# Patient Record
Sex: Female | Born: 1985 | Hispanic: Yes | Marital: Married | Smoking: Never smoker
Health system: Southern US, Community
[De-identification: ages and names within clinical notes are randomized; demographics above are authoritative.]

## PROBLEM LIST (undated history)

## (undated) DIAGNOSIS — R51 Headache: Secondary | ICD-10-CM

## (undated) DIAGNOSIS — D649 Anemia, unspecified: Secondary | ICD-10-CM

## (undated) DIAGNOSIS — C801 Malignant (primary) neoplasm, unspecified: Secondary | ICD-10-CM

## (undated) DIAGNOSIS — S025XXA Fracture of tooth (traumatic), initial encounter for closed fracture: Secondary | ICD-10-CM

## (undated) DIAGNOSIS — R238 Other skin changes: Secondary | ICD-10-CM

## (undated) DIAGNOSIS — Z853 Personal history of malignant neoplasm of breast: Secondary | ICD-10-CM

## (undated) DIAGNOSIS — R519 Headache, unspecified: Secondary | ICD-10-CM

## (undated) DIAGNOSIS — Z9221 Personal history of antineoplastic chemotherapy: Secondary | ICD-10-CM

## (undated) DIAGNOSIS — C719 Malignant neoplasm of brain, unspecified: Secondary | ICD-10-CM

---

## 2007-08-12 ENCOUNTER — Inpatient Hospital Stay (HOSPITAL_COMMUNITY): Admission: AD | Admit: 2007-08-12 | Discharge: 2007-08-12 | Payer: Self-pay | Admitting: Family Medicine

## 2007-09-08 ENCOUNTER — Inpatient Hospital Stay (HOSPITAL_COMMUNITY): Admission: AD | Admit: 2007-09-08 | Discharge: 2007-09-12 | Payer: Self-pay | Admitting: Obstetrics

## 2007-09-09 ENCOUNTER — Encounter (INDEPENDENT_AMBULATORY_CARE_PROVIDER_SITE_OTHER): Payer: Self-pay | Admitting: Obstetrics

## 2007-09-19 ENCOUNTER — Inpatient Hospital Stay (HOSPITAL_COMMUNITY): Admission: AD | Admit: 2007-09-19 | Discharge: 2007-09-19 | Payer: Self-pay | Admitting: Gynecology

## 2011-03-30 NOTE — Op Note (Signed)
[name], [name] NO.:  [id number]   MEDICAL RECORD NO.:  [id number]          PATIENT TYPE:  INP   LOCATION:  9303                          FACILITY:  [facility]   PHYSICIAN:  Kathreen Cosier, M.D.DATE OF BIRTH:  [birth year]   DATE OF PROCEDURE:  09/09/2007  DATE OF DISCHARGE:                               OPERATIVE REPORT   PREOPERATIVE DIAGNOSES:  1. Failure to progress in labor.  2. Cephalopelvic disproportion.  3. Maternal fever.   ANESTHESIA:  Epidural.   PROCEDURE:  The patient was placed on the operating table in a supine  position.  Abdomen prepped and draped.  Bladder emptied with a Foley  catheter.  Transverse suprapubic incision made and carried down to the  rectus fascia.  Fascia cleaned and incised the length of the incision.  Recti muscles retracted laterally.  Peritoneum incised longitudinally.  Transverse incision made in the visceral peritoneum above the bladder.  Bladder mobilized inferiorly.  Transverse lower uterine incision made.  The patient delivered from the OP position a female Apgar 9 and 10,  weighing 7 pounds 6 ounces.  Team was in attendance.   The placenta anterior sent to pathology.  Uterine cavity cleaned with  dry laps.  The placenta was foul smelling.  The uterine incision closed  in one layer with continuous suture of #1 chromic.  Hemostasis  satisfactory.  Bladder flap reattached with 2-0 chromic.  Uterus well  contracted.  Tubes and ovaries normal.  Abdomen closed in layers,  peritoneum continuous suture of 0 chromic, fascia continuous suture of 0  Dexon, and the skin closed with subcuticular stitch of 4-0 Monocryl.  Blood loss 500 mL.           ______________________________  Kathreen Cosier, M.D.     BAM/MEDQ  D:  09/09/2007  T:  09/10/2007  Job:  [id number]

## 2011-03-30 NOTE — H&P (Signed)
NAMELYAH, MILLIRONS NO.:  [id number]   MEDICAL RECORD NO.:  [id number]          PATIENT TYPE:  INP   LOCATION:  9303                          FACILITY:  [facility]   PHYSICIAN:  Kathreen Cosier, M.D.DATE OF BIRTH:  1986-05-02   DATE OF ADMISSION:  09/08/2007  DATE OF DISCHARGE:                              HISTORY & PHYSICAL   The patient is a 25 year old gravida 1, EDC October 22, negative GBS,  admitted with ruptured membranes which occurred at 8:30 a.m.  The fluid  was clear.  She was contracting every 2-3 minutes. Cervix 3 cm, 80%,  vertex, zero station.  This was at 2:05 p.m. on October 24.  She became  fully dilated by midnight, and after an hour of pushing, the vertex did  not progress below a zero station, and there was a lot of molding.  She  also had a temperature of 101+ and was treated with ampicillin 2 grams  IV every 6 hours and O2.  It was decided she would be delivered by C-  section for failure to progress in labor, CPD. Estimated fetal weight 8  pounds.   PHYSICAL EXAMINATION:  GENERAL:  Reveals a well-developed female in  labor.  HEENT: Negative.  LUNGS:  Clear.  HEART:  Regular rate and rhythm.  No murmurs or gallops.  BREASTS:  No masses.  ABDOMEN:  Term size with estimated fetal weight between 7-8 pounds.  PELVIC:  As described above.  EXTREMITIES:  Negative.           ______________________________  Kathreen Cosier, M.D.     BAM/MEDQ  D:  09/09/2007  T:  09/10/2007  Job:  [id number]

## 2011-04-02 NOTE — Discharge Summary (Signed)
[name], [name] NO.:  [id number]   MEDICAL RECORD NO.:  [id number]          PATIENT TYPE:  INP   LOCATION:  9303                          [facility]ACILITY:  [facility]   PHYSICIAN:  Kathreen Cosier, M.D.DATE OF BIRTH:  [birth year]   DATE OF ADMISSION:  09/08/2007  DATE OF DISCHARGE:  09/12/2007                               DISCHARGE SUMMARY   The patient is a 25 year old primigravida, [facility] September 06, 2007, who was  admitted in labor and the patient subsequently underwent primary low  transverse cesarean section because of failure to progress in labor and  CPD.  She had a female, Apgar 9/10 from the OP position, weighing 7  pounds 6 ounces.  The team was in attendance.  Postoperatively she did  well.  On admission her hemoglobin was 10.9, postop 7.1.  She was  asymptomatic.  Platelets 199 and 160.  RPR negative.  HIV negative.  Urine negative.  Postoperatively she did well and was discharged on the  third postoperative day ambulatory on a regular diet, on Tylox for pain  and ferrous sulfate for her anemia.   DISCHARGE DIAGNOSIS:  Status post primary low transverse cesarean  section because of failure to progress in labor.           ______________________________  Kathreen Cosier, M.D.     BAM/MEDQ  D:  10/04/2007  T:  10/04/2007  Job:  [id number]

## 2011-08-25 LAB — CBC
Hemoglobin: 10.9 — ABNORMAL LOW
MCV: 67 — ABNORMAL LOW
Platelets: 199
RBC: 3.27 — ABNORMAL LOW
RDW: 15.3 — ABNORMAL HIGH
WBC: 16.1 — ABNORMAL HIGH

## 2011-08-25 LAB — RPR: RPR Ser Ql: NONREACTIVE

## 2011-08-26 LAB — URINALYSIS, ROUTINE W REFLEX MICROSCOPIC
Glucose, UA: 100 — AB
Specific Gravity, Urine: 1.01
pH: 5.5

## 2012-08-03 ENCOUNTER — Emergency Department (HOSPITAL_COMMUNITY): Payer: Self-pay

## 2012-08-03 ENCOUNTER — Encounter (HOSPITAL_COMMUNITY): Payer: Self-pay | Admitting: Emergency Medicine

## 2012-08-03 ENCOUNTER — Emergency Department (HOSPITAL_COMMUNITY)
Admission: EM | Admit: 2012-08-03 | Discharge: 2012-08-03 | Disposition: A | Discharge status: Home / Self Care | Payer: Self-pay | Attending: Emergency Medicine | Admitting: Emergency Medicine

## 2012-08-03 DIAGNOSIS — Z3490 Encounter for supervision of normal pregnancy, unspecified, unspecified trimester: Secondary | ICD-10-CM

## 2012-08-03 DIAGNOSIS — Z331 Pregnant state, incidental: Secondary | ICD-10-CM | POA: Insufficient documentation

## 2012-08-03 DIAGNOSIS — N72 Inflammatory disease of cervix uteri: Secondary | ICD-10-CM | POA: Insufficient documentation

## 2012-08-03 DIAGNOSIS — R109 Unspecified abdominal pain: Secondary | ICD-10-CM | POA: Insufficient documentation

## 2012-08-03 LAB — URINE MICROSCOPIC-ADD ON

## 2012-08-03 LAB — URINALYSIS, ROUTINE W REFLEX MICROSCOPIC
Glucose, UA: NEGATIVE mg/dL
Hgb urine dipstick: NEGATIVE
Ketones, ur: NEGATIVE mg/dL
Protein, ur: NEGATIVE mg/dL

## 2012-08-03 LAB — WET PREP, GENITAL: Clue Cells Wet Prep HPF POC: NONE SEEN

## 2012-08-03 LAB — ABO/RH

## 2012-08-03 LAB — POCT PREGNANCY, URINE: Preg Test, Ur: POSITIVE — AB

## 2012-08-03 MED ORDER — ACETAMINOPHEN 325 MG PO TABS
650.0000 mg | ORAL_TABLET | Freq: Once | ORAL | Status: AC
  Administered 2012-08-03: 650 mg via ORAL
  Filled 2012-08-03: qty 2

## 2012-08-03 MED ORDER — LIDOCAINE HCL (PF) 1 % IJ SOLN
INTRAMUSCULAR | Status: AC
  Administered 2012-08-03: 0.9 mL
  Filled 2012-08-03: qty 5

## 2012-08-03 MED ORDER — CEFTRIAXONE SODIUM 250 MG IJ SOLR
250.0000 mg | Freq: Once | INTRAMUSCULAR | Status: AC
  Administered 2012-08-03: 250 mg via INTRAMUSCULAR
  Filled 2012-08-03: qty 250

## 2012-08-03 MED ORDER — AZITHROMYCIN 250 MG PO TABS
1000.0000 mg | ORAL_TABLET | Freq: Once | ORAL | Status: AC
  Administered 2012-08-03: 1000 mg via ORAL
  Filled 2012-08-03: qty 4

## 2012-08-03 NOTE — ED Notes (Signed)
Pt states abdominal pain for a week. Pt states she went to the bathroom and her urine was dark colored. Pt had a positive pregnancy test. Vaginal discharge.

## 2012-08-03 NOTE — ED Notes (Signed)
Pt going to u/s 

## 2012-08-03 NOTE — ED Provider Notes (Signed)
History     CSN: [id number]  Arrival date & time 08/03/12  7829   First MD Initiated Contact with Patient 08/03/12 [phone]      Chief Complaint  Patient presents with  . Abdominal Pain    (Consider location/radiation/quality/duration/timing/severity/associated sxs/prior treatment) HPI  26 year old female who is a G1 P1 presents c/o lower back pain and Left lower abd pain x 1 week. Hx was obtained through husband who acts as interpreter.  Pain is described as achy and cramping with occasional sharp stabbing sensation, worse to left lower abdomen.  Pain is waxing and waning.  Has noticed vaginal spotting x 2 days, and vaginal discharge. Has nipples sensitivity. Has endorse nausea and occasional vomiting after eating, decreased appetite.  Has had 2 positive home pregnancy test.  LMP July 30th.  This is a plan pregnancy.  No complication in last pregnancy.  Has not tried anything to alleviate sxs.    Past Medical History  Diagnosis Date  . Hypertension     Past Surgical History  Procedure Date  . Cesarean section     No family history on file.  History  Substance Use Topics  . Smoking status: Never Smoker   . Smokeless tobacco: Not on file  . Alcohol Use: No    OB History    Grav Para Term Preterm Abortions TAB SAB Ect Mult Living   1 1 1       1       Review of Systems  All other systems reviewed and are negative.    Allergies  Review of patient's allergies indicates no known allergies.  Home Medications   Current Outpatient Rx  Name Route Sig Dispense Refill  . PRENATAL VITAMIN PO Oral Take 1 tablet by mouth daily.      BP 98/68  Pulse 79  Temp 97.3 F (36.3 C) (Oral)  Resp 20  SpO2 100%  LMP 06/13/2012  Physical Exam  Nursing note and vitals reviewed. Constitutional: She appears well-developed and well-nourished. No distress.  HENT:  Head: Normocephalic and atraumatic.  Eyes: Conjunctivae normal are normal.  Neck: Normal range of motion. Neck  supple.  Cardiovascular: Normal rate and regular rhythm.   Pulmonary/Chest: Effort normal and breath sounds normal. She exhibits no tenderness.  Abdominal: Soft. There is no tenderness.  Genitourinary: Uterus normal. There is breast tenderness. No breast swelling, discharge or bleeding. There is no rash or lesion on the right labia. There is no rash or lesion on the left labia. Cervix exhibits discharge. Cervix exhibits no motion tenderness. Right adnexum displays no mass, no tenderness and no fullness. Left adnexum displays tenderness. Left adnexum displays no mass and no fullness. No erythema, tenderness or bleeding around the vagina. Vaginal discharge found.         Chaperone present  Lymphadenopathy:       Right: No inguinal adenopathy present.       Left: No inguinal adenopathy present.    ED Course  Procedures (including critical care time)  Labs Reviewed  POCT PREGNANCY, URINE - Abnormal; Notable for the following:    Preg Test, Ur POSITIVE (*)     All other components within normal limits  URINALYSIS, ROUTINE W REFLEX MICROSCOPIC   Results for orders placed during the hospital encounter of 08/03/12  URINALYSIS, ROUTINE W REFLEX MICROSCOPIC      Component Value Range   Color, Urine YELLOW  YELLOW   APPearance CLEAR  CLEAR   Specific Gravity, Urine 1.017  1.005 -  1.030   pH 6.5  5.0 - 8.0   Glucose, UA NEGATIVE  NEGATIVE mg/dL   Hgb urine dipstick NEGATIVE  NEGATIVE   Bilirubin Urine NEGATIVE  NEGATIVE   Ketones, ur NEGATIVE  NEGATIVE mg/dL   Protein, ur NEGATIVE  NEGATIVE mg/dL   Urobilinogen, UA 1.0  0.0 - 1.0 mg/dL   Nitrite NEGATIVE  NEGATIVE   Leukocytes, UA SMALL (*) NEGATIVE  POCT PREGNANCY, URINE      Component Value Range   Preg Test, Ur POSITIVE (*) NEGATIVE  HCG, QUANTITATIVE, PREGNANCY      Component Value Range   hCG, Beta Chain, Quant, S 14782 (*) <5 mIU/mL  ABO/RH      Component Value Range   ABO/RH(D) O POS     No rh immune globuloin NOT A RH IMMUNE  GLOBULIN CANDIDATE, PT RH POSITIVE    WET PREP, GENITAL      Component Value Range   Yeast Wet Prep HPF POC FEW (*) NONE SEEN   Trich, Wet Prep NONE SEEN  NONE SEEN   Clue Cells Wet Prep HPF POC NONE SEEN  NONE SEEN   WBC, Wet Prep HPF POC TOO NUMEROUS TO COUNT (*) NONE SEEN  URINE MICROSCOPIC-ADD ON      Component Value Range   Squamous Epithelial / LPF RARE  RARE   WBC, UA 3-6  <3 WBC/hpf   RBC / HPF 0-2  <3 RBC/hpf   Bacteria, UA RARE  RARE   US Ob Comp Less 14 Wks  08/03/2012  *RADIOLOGY REPORT*  Clinical Data: Pain.  Positive pregnancy test.  OBSTETRIC <14 WK [place] AND TRANSVAGINAL OB US  Technique:  Both transabdominal and transvaginal ultrasound examinations were performed for complete evaluation of the gestation as well as the maternal uterus, adnexal regions, and pelvic cul-de-sac.  Transvaginal technique was performed to assess early pregnancy.  Comparison:  None.  Intrauterine gestational sac:  Single. Yolk sac: Yes Embryo: Yes Cardiac Activity: Yes Heart Rate: 162 bpm  CRL: 12.7  mm  7 w  3 d             [place] EDC: 03/19/2013  Maternal uterus/adnexae: There is no subchorionic hemorrhage.  The right ovary is normal. 1.6 cm corpus luteum cyst on the left ovary.  IMPRESSION: Normal appearing single intrauterine pregnancy of approximately 7 weeks 3 days gestation.   Original Report Authenticated By: Gwynn Burly, M.D.    US Ob Transvaginal  08/03/2012  *RADIOLOGY REPORT*  Clinical Data: Pain.  Positive pregnancy test.  OBSTETRIC <14 WK [place] AND TRANSVAGINAL OB US  Technique:  Both transabdominal and transvaginal ultrasound examinations were performed for complete evaluation of the gestation as well as the maternal uterus, adnexal regions, and pelvic cul-de-sac.  Transvaginal technique was performed to assess early pregnancy.  Comparison:  None.  Intrauterine gestational sac:  Single. Yolk sac: Yes Embryo: Yes Cardiac Activity: Yes Heart Rate: 162 bpm  CRL: 12.7  mm  7 w  3 d             [place] EDC:  03/19/2013  Maternal uterus/adnexae: There is no subchorionic hemorrhage.  The right ovary is normal. 1.6 cm corpus luteum cyst on the left ovary.  IMPRESSION: Normal appearing single intrauterine pregnancy of approximately 7 weeks 3 days gestation.   Original Report Authenticated By: Gwynn Burly, M.D.     1. Pregnancy 2. cervicitis  MDM  Positive pregnancy test today.  Has abd pain and vaginal spotting. Work  up initiated.  Discussed with my attending.    11:14 AM Pelvic exam remarkable for mild tenderness to L adnexa, without evidence of PID.  US shows a normal appearing single intrauterine pregnancy of approx. 7 wks 3 days.  Pt is O+, RH+.    12:33 PM Wet prep shows WBC TNTC and a few  yeast.  Since pt has discharge and L adnexal tenderness, will treat for cervicitis with rocephin/zithromax.  Recommend f/u with her OBGYN, will also give referral to [facility] for further care.        Fayrene Helper, PA-C 08/03/12 1235

## 2012-08-07 NOTE — ED Provider Notes (Signed)
Medical screening examination/treatment/procedure(s) were performed by non-physician practitioner and as supervising physician I was immediately available for consultation/collaboration.   Suzi Roots, MD 08/07/12 [phone]

## 2012-11-15 NOTE — L&D Delivery Note (Signed)
Delivery Note At 8:33 AM a viable female was delivered via  (Presentation: ;  ).  APGAR: , ; weight .   Placenta status: , .  Cord:  with the following complications: .  Cord pH: not done  Anesthesia: Epidural  Episiotomy: None Lacerations: Sulcus;1st degree Suture Repair: 2.0 vicryl Est. Blood Loss (mL): 250  Mom to postpartum.  Baby to nursery-stable.  [name] A 03/08/2013, 8:41 AM

## 2012-11-27 LAB — OB RESULTS CONSOLE RUBELLA ANTIBODY, IGM: Rubella: IMMUNE

## 2012-11-27 LAB — OB RESULTS CONSOLE ANTIBODY SCREEN: Antibody Screen: NEGATIVE

## 2012-11-27 LAB — OB RESULTS CONSOLE ABO/RH

## 2012-11-27 LAB — OB RESULTS CONSOLE RPR: RPR: NONREACTIVE

## 2013-03-08 ENCOUNTER — Encounter (HOSPITAL_COMMUNITY): Payer: Self-pay | Admitting: Anesthesiology

## 2013-03-08 ENCOUNTER — Inpatient Hospital Stay (HOSPITAL_COMMUNITY): Payer: Medicaid Other | Admitting: Anesthesiology

## 2013-03-08 ENCOUNTER — Inpatient Hospital Stay (HOSPITAL_COMMUNITY)
Admission: AD | Admit: 2013-03-08 | Discharge: 2013-03-10 | DRG: 775 | Disposition: A | Discharge status: Home / Self Care | Payer: Medicaid Other | Source: Ambulatory Visit | Attending: Obstetrics | Admitting: Obstetrics

## 2013-03-08 ENCOUNTER — Encounter (HOSPITAL_COMMUNITY): Payer: Self-pay

## 2013-03-08 DIAGNOSIS — O34219 Maternal care for unspecified type scar from previous cesarean delivery: Secondary | ICD-10-CM | POA: Diagnosis present

## 2013-03-08 LAB — CBC
HCT: 35.9 % — ABNORMAL LOW (ref 36.0–46.0)
Hemoglobin: 11.8 g/dL — ABNORMAL LOW (ref 12.0–15.0)
MCH: 21 pg — ABNORMAL LOW (ref 26.0–34.0)
MCHC: 32.9 g/dL (ref 30.0–36.0)
RBC: 5.62 MIL/uL — ABNORMAL HIGH (ref 3.87–5.11)

## 2013-03-08 LAB — ABO/RH: ABO/RH(D): O POS

## 2013-03-08 LAB — TYPE AND SCREEN

## 2013-03-08 LAB — RPR: RPR Ser Ql: NONREACTIVE

## 2013-03-08 MED ORDER — FLEET ENEMA 7-19 GM/118ML RE ENEM: 1.0000 | ENEMA | RECTAL | Status: DC | PRN

## 2013-03-08 MED ORDER — SIMETHICONE 80 MG PO CHEW: 80.0000 mg | CHEWABLE_TABLET | ORAL | Status: DC | PRN

## 2013-03-08 MED ORDER — TERBUTALINE SULFATE 1 MG/ML IJ SOLN: 0.2500 mg | Freq: Once | INTRAMUSCULAR | Status: DC | PRN

## 2013-03-08 MED ORDER — LIDOCAINE HCL (PF) 1 % IJ SOLN
30.0000 mL | INTRAMUSCULAR | Status: DC | PRN
  Filled 2013-03-08: qty 30

## 2013-03-08 MED ORDER — LACTATED RINGERS IV SOLN
500.0000 mL | Freq: Once | INTRAVENOUS | Status: AC
  Administered 2013-03-08: 500 mL via INTRAVENOUS

## 2013-03-08 MED ORDER — ONDANSETRON HCL 4 MG/2ML IJ SOLN: 4.0000 mg | Freq: Four times a day (QID) | INTRAMUSCULAR | Status: DC | PRN

## 2013-03-08 MED ORDER — PRENATAL MULTIVITAMIN CH
1.0000 | ORAL_TABLET | Freq: Every day | ORAL | Status: DC
  Administered 2013-03-08 – 2013-03-09 (×2): 1 via ORAL
  Filled 2013-03-08 (×2): qty 1

## 2013-03-08 MED ORDER — OXYCODONE-ACETAMINOPHEN 5-325 MG PO TABS
1.0000 | ORAL_TABLET | ORAL | Status: DC | PRN
  Administered 2013-03-09: 1 via ORAL
  Administered 2013-03-09: 2 via ORAL
  Administered 2013-03-10: 1 via ORAL
  Filled 2013-03-08 (×7): qty 1

## 2013-03-08 MED ORDER — OXYTOCIN BOLUS FROM INFUSION: 500.0000 mL | INTRAVENOUS | Status: DC

## 2013-03-08 MED ORDER — OXYCODONE-ACETAMINOPHEN 5-325 MG PO TABS
1.0000 | ORAL_TABLET | ORAL | Status: DC | PRN
  Administered 2013-03-08 – 2013-03-10 (×6): 1 via ORAL
  Filled 2013-03-08 (×3): qty 1

## 2013-03-08 MED ORDER — OXYTOCIN 40 UNITS IN LACTATED RINGERS INFUSION - SIMPLE MED
1.0000 m[IU]/min | INTRAVENOUS | Status: DC
  Administered 2013-03-08: 2 m[IU]/min via INTRAVENOUS

## 2013-03-08 MED ORDER — IBUPROFEN 600 MG PO TABS
600.0000 mg | ORAL_TABLET | Freq: Four times a day (QID) | ORAL | Status: DC | PRN
  Filled 2013-03-08 (×5): qty 1

## 2013-03-08 MED ORDER — LIDOCAINE HCL (PF) 1 % IJ SOLN
INTRAMUSCULAR | Status: DC | PRN
  Administered 2013-03-08 (×2): 5 mL

## 2013-03-08 MED ORDER — BUTORPHANOL TARTRATE 1 MG/ML IJ SOLN: 1.0000 mg | INTRAMUSCULAR | Status: DC | PRN

## 2013-03-08 MED ORDER — TETANUS-DIPHTH-ACELL PERTUSSIS 5-2.5-18.5 LF-MCG/0.5 IM SUSP
0.5000 mL | Freq: Once | INTRAMUSCULAR | Status: AC
  Administered 2013-03-09: 0.5 mL via INTRAMUSCULAR
  Filled 2013-03-08: qty 0.5

## 2013-03-08 MED ORDER — CITRIC ACID-SODIUM CITRATE 334-500 MG/5ML PO SOLN: 30.0000 mL | ORAL | Status: DC | PRN

## 2013-03-08 MED ORDER — ONDANSETRON HCL 4 MG PO TABS: 4.0000 mg | ORAL_TABLET | ORAL | Status: DC | PRN

## 2013-03-08 MED ORDER — FENTANYL 2.5 MCG/ML BUPIVACAINE 1/10 % EPIDURAL INFUSION (WH - ANES)
INTRAMUSCULAR | Status: DC | PRN
  Administered 2013-03-08: 14 mL/h via EPIDURAL

## 2013-03-08 MED ORDER — FENTANYL 2.5 MCG/ML BUPIVACAINE 1/10 % EPIDURAL INFUSION (WH - ANES)
14.0000 mL/h | INTRAMUSCULAR | Status: DC | PRN
  Filled 2013-03-08: qty 125

## 2013-03-08 MED ORDER — DIPHENHYDRAMINE HCL 25 MG PO CAPS: 25.0000 mg | ORAL_CAPSULE | Freq: Four times a day (QID) | ORAL | Status: DC | PRN

## 2013-03-08 MED ORDER — EPHEDRINE 5 MG/ML INJ
10.0000 mg | INTRAVENOUS | Status: DC | PRN
  Filled 2013-03-08: qty 2

## 2013-03-08 MED ORDER — DIBUCAINE 1 % RE OINT: 1.0000 "application " | TOPICAL_OINTMENT | RECTAL | Status: DC | PRN

## 2013-03-08 MED ORDER — DIPHENHYDRAMINE HCL 50 MG/ML IJ SOLN
12.5000 mg | INTRAMUSCULAR | Status: DC | PRN
Start: 2013-03-08 — End: 2013-03-10

## 2013-03-08 MED ORDER — ACETAMINOPHEN 325 MG PO TABS: 650.0000 mg | ORAL_TABLET | ORAL | Status: DC | PRN

## 2013-03-08 MED ORDER — PHENYLEPHRINE 40 MCG/ML (10ML) SYRINGE FOR IV PUSH (FOR BLOOD PRESSURE SUPPORT)
80.0000 ug | PREFILLED_SYRINGE | INTRAVENOUS | Status: DC | PRN
  Filled 2013-03-08: qty 5
  Filled 2013-03-08: qty 2

## 2013-03-08 MED ORDER — LACTATED RINGERS IV SOLN: 500.0000 mL | INTRAVENOUS | Status: DC | PRN

## 2013-03-08 MED ORDER — ZOLPIDEM TARTRATE 5 MG PO TABS: 5.0000 mg | ORAL_TABLET | Freq: Every evening | ORAL | Status: DC | PRN

## 2013-03-08 MED ORDER — IBUPROFEN 600 MG PO TABS
600.0000 mg | ORAL_TABLET | Freq: Four times a day (QID) | ORAL | Status: DC
  Administered 2013-03-08 – 2013-03-10 (×8): 600 mg via ORAL
  Filled 2013-03-08 (×4): qty 1

## 2013-03-08 MED ORDER — LACTATED RINGERS IV SOLN
INTRAVENOUS | Status: DC
  Administered 2013-03-08: 04:00:00 via INTRAVENOUS

## 2013-03-08 MED ORDER — OXYTOCIN 40 UNITS IN LACTATED RINGERS INFUSION - SIMPLE MED
62.5000 mL/h | INTRAVENOUS | Status: DC
  Filled 2013-03-08: qty 1000

## 2013-03-08 MED ORDER — PHENYLEPHRINE 40 MCG/ML (10ML) SYRINGE FOR IV PUSH (FOR BLOOD PRESSURE SUPPORT)
80.0000 ug | PREFILLED_SYRINGE | INTRAVENOUS | Status: DC | PRN
  Filled 2013-03-08: qty 2

## 2013-03-08 MED ORDER — LANOLIN HYDROUS EX OINT: TOPICAL_OINTMENT | CUTANEOUS | Status: DC | PRN

## 2013-03-08 MED ORDER — BENZOCAINE-MENTHOL 20-0.5 % EX AERO
1.0000 "application " | INHALATION_SPRAY | CUTANEOUS | Status: DC | PRN
  Administered 2013-03-08: 1 via TOPICAL
  Filled 2013-03-08: qty 56

## 2013-03-08 MED ORDER — WITCH HAZEL-GLYCERIN EX PADS: 1.0000 "application " | MEDICATED_PAD | CUTANEOUS | Status: DC | PRN

## 2013-03-08 MED ORDER — FERROUS SULFATE 325 (65 FE) MG PO TABS
325.0000 mg | ORAL_TABLET | Freq: Two times a day (BID) | ORAL | Status: DC
  Administered 2013-03-08 – 2013-03-10 (×4): 325 mg via ORAL
  Filled 2013-03-08 (×4): qty 1

## 2013-03-08 MED ORDER — EPHEDRINE 5 MG/ML INJ
10.0000 mg | INTRAVENOUS | Status: DC | PRN
  Filled 2013-03-08: qty 2
  Filled 2013-03-08: qty 4

## 2013-03-08 MED ORDER — SENNOSIDES-DOCUSATE SODIUM 8.6-50 MG PO TABS
2.0000 | ORAL_TABLET | Freq: Every day | ORAL | Status: DC
  Administered 2013-03-08 – 2013-03-09 (×2): 2 via ORAL

## 2013-03-08 MED ORDER — ONDANSETRON HCL 4 MG/2ML IJ SOLN: 4.0000 mg | INTRAMUSCULAR | Status: DC | PRN

## 2013-03-08 NOTE — Anesthesia Postprocedure Evaluation (Signed)
  Anesthesia Post-op Note  Patient: [name]  Procedure(s) Performed: * No procedures listed *  Patient Location: PACU  Anesthesia Type:Epidural  Level of Consciousness: awake, alert , oriented and patient cooperative  Airway and Oxygen Therapy: Patient Spontanous Breathing  Post-op Pain: none  Post-op Assessment: Post-op Vital signs reviewed, Patient's Cardiovascular Status Stable and Respiratory Function Stable  Post-op Vital Signs: Reviewed and stable  Complications: No apparent anesthesia complications

## 2013-03-08 NOTE — Anesthesia Preprocedure Evaluation (Signed)
Anesthesia Evaluation  Patient identified by MRN, date of birth, ID band Patient awake    Reviewed: Allergy & Precautions, H&P , NPO status , Patient's Chart, lab work & pertinent test results  History of Anesthesia Complications Negative for: history of anesthetic complications  Airway Mallampati: II TM Distance: >3 FB Neck ROM: full    Dental no notable dental hx. (+) Teeth Intact   Pulmonary neg pulmonary ROS,  breath sounds clear to auscultation  Pulmonary exam normal       Cardiovascular hypertension, negative cardio ROS  Rhythm:regular Rate:Normal     Neuro/Psych negative neurological ROS  negative psych ROS   GI/Hepatic negative GI ROS, Neg liver ROS,   Endo/Other  negative endocrine ROS  Renal/GU negative Renal ROS  negative genitourinary   Musculoskeletal   Abdominal Normal abdominal exam  (+)   Peds  Hematology negative hematology ROS (+)   Anesthesia Other Findings   Reproductive/Obstetrics (+) Pregnancy                           Anesthesia Physical Anesthesia Plan  ASA: II  Anesthesia Plan: Epidural   Post-op Pain Management:    Induction:   Airway Management Planned:   Additional Equipment:   Intra-op Plan:   Post-operative Plan:   Informed Consent: I have reviewed the patients History and Physical, chart, labs and discussed the procedure including the risks, benefits and alternatives for the proposed anesthesia with the patient or authorized representative who has indicated his/her understanding and acceptance.     Plan Discussed with:   Anesthesia Plan Comments:         Anesthesia Quick Evaluation  

## 2013-03-08 NOTE — H&P (Signed)
This is Dr. Francoise Ceo dictating the history and physical on  [name] she's a 27 year old gravida 2 para 1001 at 37 weeks and 4 days EDC 03/18/2013 negative GBS admitted in labor she is 3 cm diameter percent vertex -1 amniotomy performed the fluids clear Past medical history negative Past surgical history she had one C-section she is in for to lack Social history negative System review negative Physical exam well-developed female in labor HEENT negative Lungs clear to P&A Breasts negative Abdomen term estimated weight 6 1-1/2 Pelvic as described above Extremities negative and

## 2013-03-08 NOTE — Anesthesia Procedure Notes (Signed)
Epidural Patient location during procedure: OB  Staffing Anesthesiologist: Gershom Brobeck Performed by: anesthesiologist   Preanesthetic Checklist Completed: patient identified, site marked, surgical consent, pre-op evaluation, timeout performed, IV checked, risks and benefits discussed and monitors and equipment checked  Epidural Patient position: sitting Prep: ChloraPrep Patient monitoring: heart rate, continuous pulse ox and blood pressure Approach: right paramedian Injection technique: LOR saline  Needle:  Needle type: Tuohy  Needle gauge: 17 G Needle length: 9 cm and 9 Needle insertion depth: 5 cm Catheter type: closed end flexible Catheter size: 20 Guage Catheter at skin depth: 10 cm Test dose: negative  Assessment Events: blood not aspirated, injection not painful, no injection resistance, negative IV test and no paresthesia  Additional Notes   Patient tolerated the insertion well without complications.   

## 2013-03-08 NOTE — MAU Note (Signed)
Started having contractions 2 1/2 hours ago, denies vaginal bleeding, G2P1 previous C/S desires vaginal birth.

## 2013-03-09 LAB — CBC
Hemoglobin: 10.4 g/dL — ABNORMAL LOW (ref 12.0–15.0)
MCH: 20.8 pg — ABNORMAL LOW (ref 26.0–34.0)
MCHC: 32.4 g/dL (ref 30.0–36.0)
RDW: 15.2 % (ref 11.5–15.5)

## 2013-03-09 NOTE — Progress Notes (Signed)
Pt signed authorization allowing her spouse to interpret.  CSW attempted to assess pt's history of depression however pt did not seem interested in talking.  She experienced depression during the last trimester but could not identify a source.  Pt never started taking the Zoloft that was prescribed to her, as per pt.  She denies any depression symptoms now.  CSW was not able to engage pt in meaningful conversation.  No barriers to discharge.

## 2013-03-09 NOTE — Progress Notes (Signed)
UR chart review completed.  

## 2013-03-09 NOTE — Progress Notes (Signed)
Post Partum Day 1 Subjective: no complaints  Objective: Blood pressure 89/58, pulse 64, temperature 97.8 F (36.6 C), temperature source Oral, resp. rate 18, height 4\' 9"  (1.448 m), weight 136 lb (61.689 kg), last menstrual period 06/13/2012, SpO2 100.00%, unknown if currently breastfeeding.  Physical Exam:  General: alert and no distress Lochia: appropriate Uterine Fundus: firm Incision: healing well DVT Evaluation: No evidence of DVT seen on physical exam.   Recent Labs  03/08/13 0402 03/09/13 0635  HGB 11.8* 10.4*  HCT 35.9* 32.1*    Assessment/Plan: Plan for discharge tomorrow   LOS: 1 day   HARPER,CHARLES A 03/09/2013, 8:36 AM

## 2013-03-10 MED ORDER — OXYCODONE-ACETAMINOPHEN 5-325 MG PO TABS: 1.0000 | ORAL_TABLET | ORAL | Status: DC | PRN

## 2013-03-10 MED ORDER — IBUPROFEN 600 MG PO TABS: 600.0000 mg | ORAL_TABLET | Freq: Four times a day (QID) | ORAL | Status: DC | PRN

## 2013-03-10 NOTE — Discharge Summary (Signed)
Obstetric Discharge Summary Reason for Admission: onset of labor Prenatal Procedures: ultrasound Intrapartum Procedures: spontaneous vaginal delivery Postpartum Procedures: none Complications-Operative and Postpartum: none Hemoglobin  Date Value Range Status  03/09/2013 10.4* 12.0 - 15.0 g/dL Final     HCT  Date Value Range Status  03/09/2013 32.1* 36.0 - 46.0 % Final    Physical Exam:  General: alert and no distress Lochia: appropriate Uterine Fundus: firm Incision: healing well DVT Evaluation: No evidence of DVT seen on physical exam.  Discharge Diagnoses: Term Pregnancy-delivered  Discharge Information: Date: 03/10/2013 Activity: pelvic rest Diet: routine Medications: PNV, Ibuprofen, Colace and Percocet Condition: stable Instructions: refer to practice specific booklet Discharge to: home Follow-up Information   Follow up with MARSHALL,BERNARD A, MD. Schedule an appointment as soon as possible for a visit in 6 weeks.   Contact information:   [address] [place] [address] [phone]       Newborn Data: Live born female  Birth Weight: 6 lb 4 oz (2835 g) APGAR: 9, 9  Home with mother.  Narcissus Detwiler A 03/10/2013, 6:36 AM

## 2013-03-10 NOTE — Progress Notes (Signed)
Post Partum Day 2 Subjective: no complaints  Objective: Blood pressure 107/73, pulse 75, temperature 98.3 F (36.8 C), temperature source Oral, resp. rate 19, height 4\' 9"  (1.448 m), weight 136 lb (61.689 kg), last menstrual period 06/13/2012, SpO2 97.00%, unknown if currently breastfeeding.  Physical Exam:  General: alert and no distress Lochia: appropriate Uterine Fundus: firm Incision: healing well DVT Evaluation: No evidence of DVT seen on physical exam.   Recent Labs  03/08/13 0402 03/09/13 0635  HGB 11.8* 10.4*  HCT 35.9* 32.1*    Assessment/Plan: Discharge home   LOS: 2 days   [name] A 03/10/2013, 6:30 AM

## 2013-08-30 ENCOUNTER — Emergency Department (HOSPITAL_COMMUNITY): Payer: Medicaid Other

## 2013-08-30 ENCOUNTER — Encounter (HOSPITAL_COMMUNITY): Payer: Self-pay | Admitting: Emergency Medicine

## 2013-08-30 ENCOUNTER — Emergency Department (HOSPITAL_COMMUNITY)
Admission: EM | Admit: 2013-08-30 | Discharge: 2013-08-30 | Disposition: A | Discharge status: Home / Self Care | Payer: Self-pay | Attending: Emergency Medicine | Admitting: Emergency Medicine

## 2013-08-30 DIAGNOSIS — R51 Headache: Secondary | ICD-10-CM | POA: Insufficient documentation

## 2013-08-30 DIAGNOSIS — I10 Essential (primary) hypertension: Secondary | ICD-10-CM | POA: Insufficient documentation

## 2013-08-30 DIAGNOSIS — Z3202 Encounter for pregnancy test, result negative: Secondary | ICD-10-CM | POA: Insufficient documentation

## 2013-08-30 LAB — CBC WITH DIFFERENTIAL/PLATELET
Basophils Absolute: 0.1 10*3/uL (ref 0.0–0.1)
Eosinophils Absolute: 0.2 10*3/uL (ref 0.0–0.7)
HCT: 37.2 % (ref 36.0–46.0)
Hemoglobin: 12.4 g/dL (ref 12.0–15.0)
Lymphocytes Relative: 35 % (ref 12–46)
MCHC: 33.3 g/dL (ref 30.0–36.0)
Monocytes Relative: 5 % (ref 3–12)
Neutro Abs: 4.4 10*3/uL (ref 1.7–7.7)
Neutrophils Relative %: 56 % (ref 43–77)
RDW: 16.5 % — ABNORMAL HIGH (ref 11.5–15.5)
WBC: 7.8 10*3/uL (ref 4.0–10.5)

## 2013-08-30 LAB — BASIC METABOLIC PANEL
Calcium: 9.2 mg/dL (ref 8.4–10.5)
GFR calc Af Amer: >90 mL/min (ref >90)
GFR calc non Af Amer: >90 mL/min (ref >90)
Glucose, Bld: 83 mg/dL (ref 70–99)
Potassium: 4.2 mEq/L (ref 3.5–5.1)
Sodium: 135 mEq/L (ref 135–145)

## 2013-08-30 LAB — POCT PREGNANCY, URINE: Preg Test, Ur: NEGATIVE

## 2013-08-30 MED ORDER — SODIUM CHLORIDE 0.9 % IV BOLUS (SEPSIS)
500.0000 mL | Freq: Once | INTRAVENOUS | Status: AC
  Administered 2013-08-30: 500 mL via INTRAVENOUS

## 2013-08-30 MED ORDER — BUTALBITAL-APAP-CAFFEINE 50-325-40 MG PO TABS: 1.0000 | ORAL_TABLET | Freq: Four times a day (QID) | ORAL | Status: DC | PRN

## 2013-08-30 MED ORDER — KETOROLAC TROMETHAMINE 30 MG/ML IJ SOLN: 15.0000 mg | Freq: Once | INTRAMUSCULAR | Status: DC

## 2013-08-30 MED ORDER — SODIUM CHLORIDE 0.9 % IV BOLUS (SEPSIS)
1000.0000 mL | Freq: Once | INTRAVENOUS | Status: AC
  Administered 2013-08-30: 1000 mL via INTRAVENOUS

## 2013-08-30 MED ORDER — KETOROLAC TROMETHAMINE 15 MG/ML IJ SOLN
15.0000 mg | Freq: Once | INTRAMUSCULAR | Status: AC
  Administered 2013-08-30: 15 mg via INTRAVENOUS
  Filled 2013-08-30: qty 1

## 2013-08-30 MED ORDER — PROCHLORPERAZINE EDISYLATE 5 MG/ML IJ SOLN
10.0000 mg | Freq: Once | INTRAMUSCULAR | Status: AC
  Administered 2013-08-30: 10 mg via INTRAVENOUS
  Filled 2013-08-30: qty 2

## 2013-08-30 NOTE — ED Notes (Signed)
Pt has had HA x6 days with sensitivity to light and sound.  No fever, no cough or cold.  Pt reports some dizziness and some changes in her vision.  No neck stiffness but pain down the left side of her neck

## 2013-08-30 NOTE — ED Provider Notes (Signed)
CSN: [id number]     Arrival date & time 08/30/13  1453 History   First MD Initiated Contact with Patient 08/30/13 1534     Chief Complaint  Patient presents with  . Migraine   (Consider location/radiation/quality/duration/timing/severity/associated sxs/prior Treatment) HPI  [name] is a 27 y.o. female complaining of diffuse headache, rated at 10 out of 10, no exacerbating or alleviating factors identified for 6 days. Patient states that the headache started at 10 out of 10, she denies syncope. States she does not get headaches and this is the first headache of her life. She is sensitive to both light and sound and she describes black gray floaters in all fields of her vision. Denies fever, sick contacts, syncope. Endorses cervicalgia to the left lateral side of the neck.  Past Medical History  Diagnosis Date  . Hypertension    Past Surgical History  Procedure Laterality Date  . Cesarean section     No family history on file. History  Substance Use Topics  . Smoking status: Never Smoker   . Smokeless tobacco: Not on file  . Alcohol Use: No   OB History   Grav Para Term Preterm Abortions TAB SAB Ect Mult Living   2 2 2       2      Review of Systems 10 systems reviewed and found to be negative, except as noted in the HPI   Allergies  Review of patient's allergies indicates no known allergies.  Home Medications   Current Outpatient Rx  Name  Route  Sig  Dispense  Refill  . ibuprofen (ADVIL,MOTRIN) 600 MG tablet   Oral   Take 1 tablet (600 mg total) by mouth every 6 (six) hours as needed for pain (pain scale < 4).   30 tablet   5    BP 104/66  Pulse 75  Temp(Src) 98.1 F (36.7 C) (Oral)  Resp 16  SpO2 100%  Breastfeeding? Yes Physical Exam  Nursing note and vitals reviewed. Constitutional: She is oriented to person, place, and time. She appears well-developed and well-nourished. No distress.  Appears well, light sensitive.  HENT:  Head: Normocephalic  and atraumatic.  Mouth/Throat: Oropharynx is clear and moist.  Eyes: Conjunctivae and EOM are normal. Pupils are equal, round, and reactive to light.  Neck:  Patient can flex chin to chest without issue, does not exhibit meningismus however she is tender to deep palpation of the posterior C-spine.  Cardiovascular: Normal rate, regular rhythm and intact distal pulses.   Pulmonary/Chest: Effort normal and breath sounds normal. No stridor. No respiratory distress. She has no wheezes. She has no rales. She exhibits no tenderness.  Abdominal: Soft. Bowel sounds are normal. She exhibits no distension and no mass. There is no tenderness. There is no rebound and no guarding.  Musculoskeletal: Normal range of motion. She exhibits no edema.  Neurological: She is alert and oriented to person, place, and time.  Follows commands, Goal oriented speech, Strength is 5 out of 5x4 extremities, patient ambulates with a coordinated in nonantalgic gait. Sensation is grossly intact.  Psychiatric: She has a normal mood and affect.    ED Course  Procedures (including critical care time) Labs Review Labs Reviewed  CBC WITH DIFFERENTIAL - Abnormal; Notable for the following:    RBC 5.99 (*)    MCV 62.1 (*)    MCH 20.7 (*)    RDW 16.5 (*)    All other components within normal limits  BASIC METABOLIC PANEL  POCT  PREGNANCY, URINE   Imaging Review Ct Head Wo Contrast  08/30/2013   CLINICAL DATA:  Seizure and headaches.  EXAM: CT HEAD WITHOUT CONTRAST  TECHNIQUE: Contiguous axial images were obtained from the base of the skull through the vertex without contrast.  COMPARISON:  None  FINDINGS: Normal appearance of the intracranial structures. No evidence for acute hemorrhage, mass lesion, midline shift, hydrocephalus or large infarct. No acute bony abnormality. There is mucosal thickening in the maxillary sinuses.  IMPRESSION: No acute intracranial abnormality.  Maxillary sinus disease.   Electronically Signed   By:  Richarda Overlie M.D.   On: 08/30/2013 16:57    EKG Interpretation   None       MDM   1. Headache     Filed Vitals:   08/30/13 1845 08/30/13 1852 08/30/13 1922 08/30/13 1932  BP: 88/49 97/66 89/44  98/68  Pulse: 93 90 45 85  Temp:   98.3 F (36.8 C)   TempSrc:      Resp: 16 18 18 20   SpO2: 99% 100% 100% 100%     [name] is a 27 y.o. female with first headache of her life, rated as severe, 10 out of 10, she describes it with acute onset. Patient does not have any meningeal signs except for deep palpation of the posterior cervical spine. Neuro exam is nonfocal.  6:19 PM patient's symptoms have completely resolved, there is no headache she rates it 0/10 at the moment she is no longer tender to palpation along the posterior C-spine after administration of Toradol.   Patient's pressure is slightly soft, will ambulate her and recheck. Blood pressure has rebounded after ambulation and small bolus. Discussed case with attending who agrees with plan and stability to d/c to home.   Medications  sodium chloride 0.9 % bolus 500 mL (500 mLs Intravenous New Bag/Given 08/30/13 1859)  sodium chloride 0.9 % bolus 1,000 mL (0 mLs Intravenous Stopped 08/30/13 1714)  prochlorperazine (COMPAZINE) injection 10 mg (10 mg Intravenous Given 08/30/13 1710)  ketorolac (TORADOL) 15 MG/ML injection 15 mg (15 mg Intravenous Given 08/30/13 1709)    Pt is hemodynamically stable, appropriate for, and amenable to discharge at this time. Pt verbalized understanding and agrees with care plan. All questions answered. Outpatient follow-up and specific return precautions discussed.    New Prescriptions   BUTALBITAL-ACETAMINOPHEN-CAFFEINE (FIORICET) 50-325-40 MG PER TABLET    Take 1 tablet by mouth every 6 (six) hours as needed for headache.    Note: Portions of this report may have been transcribed using voice recognition software. Every effort was made to ensure accuracy; however, inadvertent computerized  transcription errors may be present     Wynetta Emery, PA-C 08/30/13 1944

## 2013-08-31 NOTE — ED Provider Notes (Signed)
Medical screening examination/treatment/procedure(s) were performed by non-physician practitioner and as supervising physician I was immediately available for consultation/collaboration.    Vida Roller, MD 08/31/13 [phone]

## 2013-11-15 DIAGNOSIS — Z853 Personal history of malignant neoplasm of breast: Secondary | ICD-10-CM

## 2013-11-15 HISTORY — DX: Personal history of malignant neoplasm of breast: Z85.3

## 2014-06-11 ENCOUNTER — Ambulatory Visit (HOSPITAL_COMMUNITY)
Admission: RE | Admit: 2014-06-11 | Discharge: 2014-06-11 | Disposition: A | Discharge status: Home / Self Care | Payer: No Typology Code available for payment source | Source: Ambulatory Visit | Attending: Obstetrics and Gynecology | Admitting: Obstetrics and Gynecology

## 2014-06-11 ENCOUNTER — Encounter (HOSPITAL_COMMUNITY): Payer: Self-pay

## 2014-06-11 VITALS — BP 102/60 | Temp 97.8°F | Ht 60.0 in | Wt 116.0 lb

## 2014-06-11 DIAGNOSIS — Z1239 Encounter for other screening for malignant neoplasm of breast: Secondary | ICD-10-CM

## 2014-06-11 DIAGNOSIS — N6321 Unspecified lump in the left breast, upper outer quadrant: Secondary | ICD-10-CM

## 2014-06-11 NOTE — Patient Instructions (Signed)
Explained to [name] that she did not need a Pap smear today due to last Pap smear was in 2013 per patient. Let her know [facility] will cover Pap smears every 3 years unless has a history of abnormal Pap smears. Told patient that her Pap smear will be due next year and if still eligible can get completed at [facility]. Referred patient to [facility] for a left breast biopsy per recommendation. Appointment scheduled for Wednesday, June 12, 2014 at 1330. Patient aware of appointment and will be there. [name] verbalized understanding.  Talyn Dessert, Arvil Chaco, RN 8:52 AM

## 2014-06-11 NOTE — Progress Notes (Signed)
Complaints of left breast lump x 6 months. Patient referred to [facility] by [facility] due to recommending a left breast biopsy. Left breast ultrasound completed 06/06/14 at [facility]  Pap Smear:  Pap smear not completed today. Last Pap smear was in 2013 at Dr. Milinda Cave office and normal per patient. Per patient has no history of an abnormal Pap smear. No Pap smear results in EPIC.  Physical exam: Breasts Breasts symmetrical. No skin abnormalities bilateral breasts. No nipple retraction bilateral breasts. No nipple discharge bilateral breasts. No lymphadenopathy. No lumps palpated right breast breast. Palpated a lump within the left breast at 2 o'clock under the areola. No complaints of pain or tenderness on exam. Referred patient to [facility] for a left breast biopsy per recommendation. Appointment scheduled for Wednesday, June 12, 2014 at 1330.  Pelvic/Bimanual No Pap smear completed today since last Pap smear was in 2013 per patient. Pap smear not indicated per [facility]CP guidelines.

## 2014-06-12 ENCOUNTER — Other Ambulatory Visit: Payer: Self-pay | Admitting: Radiology

## 2014-06-14 ENCOUNTER — Encounter (HOSPITAL_COMMUNITY): Payer: Self-pay

## 2014-06-24 ENCOUNTER — Ambulatory Visit (INDEPENDENT_AMBULATORY_CARE_PROVIDER_SITE_OTHER): Payer: PRIVATE HEALTH INSURANCE | Admitting: General Surgery

## 2014-06-24 ENCOUNTER — Encounter (INDEPENDENT_AMBULATORY_CARE_PROVIDER_SITE_OTHER): Payer: Self-pay | Admitting: General Surgery

## 2014-06-24 VITALS — BP 90/68 | HR 75 | Temp 96.6°F | Ht 60.0 in | Wt 113.0 lb

## 2014-06-24 DIAGNOSIS — N63 Unspecified lump in unspecified breast: Secondary | ICD-10-CM

## 2014-06-24 DIAGNOSIS — N6320 Unspecified lump in the left breast, unspecified quadrant: Secondary | ICD-10-CM

## 2014-06-24 NOTE — Patient Instructions (Signed)
Plan for left breast lumpectomy

## 2014-06-24 NOTE — Progress Notes (Signed)
Patient ID: [name], female   DOB: Jun 29, 1986, 28 y.o.   MRN: [id number]  Chief Complaint  Patient presents with  . Eval L breast    HPI [name] is a 28 y.o. female.  We are asked to see the patient in consultation by Dr. Marcelo Baldy to evaluate her for a left breast mass. The patient is a 28 year old Hispanic female who first felt a lump centrally in her left breast about 4-5 weeks ago. The mass has been somewhat tender for at times. She has not had any discharge from her nipple. She has not had any history of breast problems nor any family history of breast problems. The area measured 1.9 cm by ultrasound. The area was biopsied and came back as fibrocystic tissue. The etiology is felt that this result was discordant  HPI  Past Medical History  Diagnosis Date  . Hypertension     Past Surgical History  Procedure Laterality Date  . Cesarean section      Family History  Problem Relation Age of Onset  . Diabetes Mother   . Hypertension Mother   . Diabetes Maternal Grandmother   . Hypertension Maternal Grandmother     Social History History  Substance Use Topics  . Smoking status: Never Smoker   . Smokeless tobacco: Not on file  . Alcohol Use: Yes     Comment: weekends only    No Known Allergies  Current Outpatient Prescriptions  Medication Sig Dispense Refill  . ibuprofen (ADVIL,MOTRIN) 600 MG tablet Take 1 tablet (600 mg total) by mouth every 6 (six) hours as needed for pain (pain scale < 4).  30 tablet  5   No current facility-administered medications for this visit.    Review of Systems Review of Systems  Constitutional: Negative.   HENT: Negative.   Eyes: Negative.   Respiratory: Negative.   Cardiovascular: Negative.   Gastrointestinal: Negative.   Endocrine: Negative.   Genitourinary: Negative.   Musculoskeletal: Negative.   Skin: Negative.   Allergic/Immunologic: Negative.   Neurological: Negative.   Hematological: Negative.    Psychiatric/Behavioral: Negative.     Blood pressure 90/68, pulse 75, temperature 96.6 F (35.9 C), height 5' (1.524 m), weight 113 lb (51.256 kg), last menstrual period 06/07/2014, SpO2 98.00%, not currently breastfeeding.  Physical Exam Physical Exam  Constitutional: She is oriented to person, place, and time. She appears well-developed and well-nourished.  HENT:  Head: Normocephalic and atraumatic.  Eyes: Conjunctivae and EOM are normal. Pupils are equal, round, and reactive to light.  Neck: Normal range of motion. Neck supple.  Cardiovascular: Normal rate, regular rhythm and normal heart sounds.   Pulmonary/Chest: Effort normal and breath sounds normal.  There is an approximately 2 cm mobile mass in the subareolar area on the left. There is also a small mobile palpable lymph node in the left axilla. There is no palpable mass in the right breast. There is no palpable axillary supraclavicular or cervical lymphadenopathy on the right  Abdominal: Soft. Bowel sounds are normal.  Musculoskeletal: Normal range of motion.  Lymphadenopathy:    She has no cervical adenopathy.  Neurological: She is alert and oriented to person, place, and time.  Skin: Skin is warm and dry.  Psychiatric: She has a normal mood and affect. Her behavior is normal.    Data Reviewed As above  Assessment    The patient has a mass centrally in the left breast which was biopsied and was benign but was felt to be discordant. Because  of this the recommendation would be for open biopsy of this area. I've discussed with her in detail the risks and benefits of the operation to do this as well as some of the technical aspects and she understands and wishes to proceed     Plan    Lab for left breast lumpectomy        TOTH III,PAUL S 06/24/2014, 9:16 AM

## 2014-07-02 ENCOUNTER — Encounter (HOSPITAL_BASED_OUTPATIENT_CLINIC_OR_DEPARTMENT_OTHER): Payer: Self-pay | Admitting: *Deleted

## 2014-07-02 NOTE — Progress Notes (Addendum)
Requested spanish interpreter from [place] for [facility] for Wednesday 10:45 until 1200 and then 13:00 until 14:30pm. Waiting to hear back. Pt is coming for Urine pregnancy test this week.  Lavon Paganini interpreter called to do history with patient over the phone.

## 2014-07-03 ENCOUNTER — Encounter (HOSPITAL_BASED_OUTPATIENT_CLINIC_OR_DEPARTMENT_OTHER)
Admission: RE | Admit: 2014-07-03 | Discharge: 2014-07-03 | Disposition: A | Discharge status: Home / Self Care | Payer: No Typology Code available for payment source | Source: Ambulatory Visit | Attending: General Surgery | Admitting: General Surgery

## 2014-07-03 DIAGNOSIS — Z01818 Encounter for other preprocedural examination: Secondary | ICD-10-CM | POA: Insufficient documentation

## 2014-07-03 DIAGNOSIS — N63 Unspecified lump in unspecified breast: Secondary | ICD-10-CM | POA: Insufficient documentation

## 2014-07-03 LAB — PREGNANCY, URINE: Preg Test, Ur: NEGATIVE

## 2014-07-04 NOTE — Progress Notes (Signed)
Colletta [place] called back with interpreter Larkin Ina) for  07/10/2014 1045-1200 and 1300- 1430.

## 2014-07-10 ENCOUNTER — Encounter (HOSPITAL_BASED_OUTPATIENT_CLINIC_OR_DEPARTMENT_OTHER): Payer: Self-pay | Admitting: *Deleted

## 2014-07-10 ENCOUNTER — Ambulatory Visit (HOSPITAL_BASED_OUTPATIENT_CLINIC_OR_DEPARTMENT_OTHER)
Admission: RE | Admit: 2014-07-10 | Discharge: 2014-07-10 | Disposition: A | Discharge status: Home / Self Care | Payer: No Typology Code available for payment source | Source: Ambulatory Visit | Attending: General Surgery | Admitting: General Surgery

## 2014-07-10 ENCOUNTER — Encounter (HOSPITAL_BASED_OUTPATIENT_CLINIC_OR_DEPARTMENT_OTHER)
Admission: RE | Disposition: A | Discharge status: Home / Self Care | Payer: Self-pay | Source: Ambulatory Visit | Attending: General Surgery

## 2014-07-10 ENCOUNTER — Encounter (HOSPITAL_BASED_OUTPATIENT_CLINIC_OR_DEPARTMENT_OTHER): Payer: Self-pay | Admitting: Anesthesiology

## 2014-07-10 ENCOUNTER — Ambulatory Visit (HOSPITAL_BASED_OUTPATIENT_CLINIC_OR_DEPARTMENT_OTHER): Payer: No Typology Code available for payment source | Admitting: Anesthesiology

## 2014-07-10 DIAGNOSIS — N6320 Unspecified lump in the left breast, unspecified quadrant: Secondary | ICD-10-CM

## 2014-07-10 DIAGNOSIS — Z87891 Personal history of nicotine dependence: Secondary | ICD-10-CM | POA: Insufficient documentation

## 2014-07-10 DIAGNOSIS — Z79899 Other long term (current) drug therapy: Secondary | ICD-10-CM | POA: Insufficient documentation

## 2014-07-10 DIAGNOSIS — D0590 Unspecified type of carcinoma in situ of unspecified breast: Secondary | ICD-10-CM

## 2014-07-10 DIAGNOSIS — N63 Unspecified lump in unspecified breast: Secondary | ICD-10-CM | POA: Insufficient documentation

## 2014-07-10 HISTORY — DX: Malignant (primary) neoplasm, unspecified: C80.1

## 2014-07-10 HISTORY — PX: BREAST LUMPECTOMY: SHX2

## 2014-07-10 SURGERY — BREAST LUMPECTOMY
Anesthesia: General | Site: Breast | Laterality: Left

## 2014-07-10 MED ORDER — FENTANYL CITRATE 0.05 MG/ML IJ SOLN
INTRAMUSCULAR | Status: DC | PRN
Start: 2014-07-10 — End: 2014-07-10
  Administered 2014-07-10: 100 ug via INTRAVENOUS

## 2014-07-10 MED ORDER — MIDAZOLAM HCL 2 MG/2ML IJ SOLN
INTRAMUSCULAR | Status: AC
  Filled 2014-07-10: qty 2

## 2014-07-10 MED ORDER — FENTANYL CITRATE 0.05 MG/ML IJ SOLN: 50.0000 ug | INTRAMUSCULAR | Status: DC | PRN

## 2014-07-10 MED ORDER — MIDAZOLAM HCL 2 MG/2ML IJ SOLN: 1.0000 mg | INTRAMUSCULAR | Status: DC | PRN

## 2014-07-10 MED ORDER — LACTATED RINGERS IV SOLN
INTRAVENOUS | Status: DC
  Administered 2014-07-10 (×2): via INTRAVENOUS

## 2014-07-10 MED ORDER — BUPIVACAINE-EPINEPHRINE (PF) 0.25% -1:200000 IJ SOLN
INTRAMUSCULAR | Status: AC
  Filled 2014-07-10: qty 30

## 2014-07-10 MED ORDER — HYDROMORPHONE HCL PF 1 MG/ML IJ SOLN
INTRAMUSCULAR | Status: AC
  Filled 2014-07-10: qty 1

## 2014-07-10 MED ORDER — CHLORHEXIDINE GLUCONATE 4 % EX LIQD: 1.0000 "application " | Freq: Once | CUTANEOUS | Status: DC

## 2014-07-10 MED ORDER — PROPOFOL 10 MG/ML IV BOLUS
INTRAVENOUS | Status: DC | PRN
  Administered 2014-07-10: 200 mg via INTRAVENOUS

## 2014-07-10 MED ORDER — OXYCODONE HCL 5 MG PO TABS
5.0000 mg | ORAL_TABLET | Freq: Once | ORAL | Status: AC | PRN
  Administered 2014-07-10: 5 mg via ORAL

## 2014-07-10 MED ORDER — EPHEDRINE SULFATE 50 MG/ML IJ SOLN
INTRAMUSCULAR | Status: DC | PRN
  Administered 2014-07-10: 10 mg via INTRAVENOUS
  Administered 2014-07-10: 5 mg via INTRAVENOUS

## 2014-07-10 MED ORDER — LIDOCAINE HCL (CARDIAC) 20 MG/ML IV SOLN
INTRAVENOUS | Status: DC | PRN
Start: 2014-07-10 — End: 2014-07-10
  Administered 2014-07-10: 50 mg via INTRAVENOUS

## 2014-07-10 MED ORDER — BUPIVACAINE HCL (PF) 0.25 % IJ SOLN
INTRAMUSCULAR | Status: AC
  Filled 2014-07-10: qty 30

## 2014-07-10 MED ORDER — BUPIVACAINE HCL (PF) 0.25 % IJ SOLN
INTRAMUSCULAR | Status: DC | PRN
Start: 2014-07-10 — End: 2014-07-10
  Administered 2014-07-10: 16 mL

## 2014-07-10 MED ORDER — CEFAZOLIN SODIUM-DEXTROSE 2-3 GM-% IV SOLR
2.0000 g | INTRAVENOUS | Status: AC
  Administered 2014-07-10: 2 g via INTRAVENOUS

## 2014-07-10 MED ORDER — MIDAZOLAM HCL 5 MG/5ML IJ SOLN
INTRAMUSCULAR | Status: DC | PRN
  Administered 2014-07-10: 2 mg via INTRAVENOUS

## 2014-07-10 MED ORDER — ONDANSETRON HCL 4 MG/2ML IJ SOLN: 4.0000 mg | Freq: Four times a day (QID) | INTRAMUSCULAR | Status: DC | PRN

## 2014-07-10 MED ORDER — OXYCODONE HCL 5 MG PO TABS
ORAL_TABLET | ORAL | Status: AC
  Filled 2014-07-10: qty 1

## 2014-07-10 MED ORDER — FENTANYL CITRATE 0.05 MG/ML IJ SOLN
INTRAMUSCULAR | Status: AC
  Filled 2014-07-10: qty 6

## 2014-07-10 MED ORDER — ONDANSETRON HCL 4 MG/2ML IJ SOLN
INTRAMUSCULAR | Status: DC | PRN
  Administered 2014-07-10: 4 mg via INTRAVENOUS

## 2014-07-10 MED ORDER — CEFAZOLIN SODIUM-DEXTROSE 2-3 GM-% IV SOLR
INTRAVENOUS | Status: AC
  Filled 2014-07-10: qty 50

## 2014-07-10 MED ORDER — OXYCODONE-ACETAMINOPHEN 5-325 MG PO TABS: 1.0000 | ORAL_TABLET | ORAL | Status: DC | PRN

## 2014-07-10 MED ORDER — HYDROMORPHONE HCL PF 1 MG/ML IJ SOLN
0.2500 mg | INTRAMUSCULAR | Status: DC | PRN
  Administered 2014-07-10: 0.25 mg via INTRAVENOUS
  Administered 2014-07-10: 0.5 mg via INTRAVENOUS

## 2014-07-10 MED ORDER — OXYCODONE HCL 5 MG/5ML PO SOLN: 5.0000 mg | Freq: Once | ORAL | Status: AC | PRN

## 2014-07-10 MED ORDER — DEXAMETHASONE SODIUM PHOSPHATE 4 MG/ML IJ SOLN
INTRAMUSCULAR | Status: DC | PRN
  Administered 2014-07-10: 10 mg via INTRAVENOUS

## 2014-07-10 SURGICAL SUPPLY — 38 items
BLADE SURG 15 STRL LF DISP TIS (BLADE) ×1 IMPLANT
BLADE SURG 15 STRL SS (BLADE) ×2
CANISTER SUCT 1200ML W/VALVE (MISCELLANEOUS) ×3 IMPLANT
CHLORAPREP W/TINT 26ML (MISCELLANEOUS) ×3 IMPLANT
CLIP TI WIDE RED SMALL 6 (CLIP) IMPLANT
COVER MAYO STAND STRL (DRAPES) ×3 IMPLANT
COVER TABLE BACK 60X90 (DRAPES) ×3 IMPLANT
DECANTER SPIKE VIAL GLASS SM (MISCELLANEOUS) IMPLANT
DERMABOND ADVANCED (GAUZE/BANDAGES/DRESSINGS) ×2
DERMABOND ADVANCED .7 DNX12 (GAUZE/BANDAGES/DRESSINGS) ×1 IMPLANT
DEVICE DUBIN W/COMP PLATE 8390 (MISCELLANEOUS) ×3 IMPLANT
DRAPE LAPAROSCOPIC ABDOMINAL (DRAPES) ×3 IMPLANT
DRAPE UTILITY XL STRL (DRAPES) ×3 IMPLANT
ELECT COATED BLADE 2.86 ST (ELECTRODE) ×3 IMPLANT
ELECT REM PT RETURN 9FT ADLT (ELECTROSURGICAL) ×3
ELECTRODE REM PT RTRN 9FT ADLT (ELECTROSURGICAL) ×1 IMPLANT
GLOVE BIO SURGEON STRL SZ7.5 (GLOVE) ×3 IMPLANT
GLOVE SURG SS PI 7.0 STRL IVOR (GLOVE) ×3 IMPLANT
GOWN STRL REUS W/ TWL LRG LVL3 (GOWN DISPOSABLE) ×2 IMPLANT
GOWN STRL REUS W/TWL LRG LVL3 (GOWN DISPOSABLE) ×4
NEEDLE HYPO 25X1 1.5 SAFETY (NEEDLE) ×3 IMPLANT
NS IRRIG 1000ML POUR BTL (IV SOLUTION) ×3 IMPLANT
PACK BASIN DAY SURGERY FS (CUSTOM PROCEDURE TRAY) ×3 IMPLANT
PENCIL BUTTON HOLSTER BLD 10FT (ELECTRODE) ×3 IMPLANT
SLEEVE SCD COMPRESS KNEE MED (MISCELLANEOUS) ×3 IMPLANT
SPONGE LAP 18X18 X RAY DECT (DISPOSABLE) ×3 IMPLANT
STAPLER VISISTAT 35W (STAPLE) IMPLANT
SUT MON AB 4-0 PC3 18 (SUTURE) ×3 IMPLANT
SUT SILK 2 0 SH (SUTURE) ×3 IMPLANT
SUT VIC AB 3-0 54X BRD REEL (SUTURE) IMPLANT
SUT VIC AB 3-0 BRD 54 (SUTURE)
SUT VICRYL 3-0 CR8 SH (SUTURE) ×3 IMPLANT
SYR CONTROL 10ML LL (SYRINGE) ×3 IMPLANT
TOWEL OR 17X24 6PK STRL BLUE (TOWEL DISPOSABLE) ×3 IMPLANT
TOWEL OR NON WOVEN STRL DISP B (DISPOSABLE) IMPLANT
TUBE CONNECTING 20'X1/4 (TUBING) ×1
TUBE CONNECTING 20X1/4 (TUBING) ×2 IMPLANT
YANKAUER SUCT BULB TIP NO VENT (SUCTIONS) ×3 IMPLANT

## 2014-07-10 NOTE — Transfer of Care (Signed)
Immediate Anesthesia Transfer of Care Note  Patient: [name]  Procedure(s) Performed: Procedure(s): LEFT BREAST LUMPECTOMY (Left)  Patient Location: PACU  Anesthesia Type:General  Level of Consciousness: awake and alert   Airway & Oxygen Therapy: Patient Spontanous Breathing and Patient connected to face mask oxygen  Post-op Assessment: Report given to PACU RN and Post -op Vital signs reviewed and stable  Post vital signs: Reviewed and stable  Complications: No apparent anesthesia complications

## 2014-07-10 NOTE — Anesthesia Postprocedure Evaluation (Signed)
Anesthesia Post Note  Patient: [name]  Procedure(s) Performed: Procedure(s) (LRB): LEFT BREAST LUMPECTOMY (Left)  Anesthesia type: General  Patient location: PACU  Post pain: Pain level controlled and Adequate analgesia  Post assessment: Post-op Vital signs reviewed, Patient's Cardiovascular Status Stable, Respiratory Function Stable, Patent Airway and Pain level controlled  Last Vitals:  Filed Vitals:   07/10/14 1303  BP: 107/57  Pulse:   Temp:   Resp:     Post vital signs: Reviewed and stable  Level of consciousness: awake, alert  and oriented  Complications: No apparent anesthesia complications

## 2014-07-10 NOTE — Anesthesia Preprocedure Evaluation (Signed)
Anesthesia Evaluation  Patient identified by MRN, date of birth, ID band Patient awake    Reviewed: Allergy & Precautions, H&P , NPO status , Patient's Chart, lab work & pertinent test results  Airway Mallampati: II  Neck ROM: full    Dental   Pulmonary neg pulmonary ROS, former smoker,          Cardiovascular negative cardio ROS      Neuro/Psych    GI/Hepatic   Endo/Other    Renal/GU      Musculoskeletal   Abdominal   Peds  Hematology   Anesthesia Other Findings   Reproductive/Obstetrics                           Anesthesia Physical Anesthesia Plan  ASA: I  Anesthesia Plan: General   Post-op Pain Management:    Induction: Intravenous  Airway Management Planned: LMA  Additional Equipment:   Intra-op Plan:   Post-operative Plan:   Informed Consent: I have reviewed the patients History and Physical, chart, labs and discussed the procedure including the risks, benefits and alternatives for the proposed anesthesia with the patient or authorized representative who has indicated his/her understanding and acceptance.     Plan Discussed with: CRNA, Anesthesiologist and Surgeon  Anesthesia Plan Comments:         Anesthesia Quick Evaluation

## 2014-07-10 NOTE — Op Note (Signed)
07/10/2014  12:58 PM  PATIENT:  [name]  28 y.o. female  PRE-OPERATIVE DIAGNOSIS:  left breast mass  POST-OPERATIVE DIAGNOSIS:  left breast mass  PROCEDURE:  Procedure(s): LEFT BREAST LUMPECTOMY (Left)  SURGEON:  Surgeon(s) and Role:    * Merrie Roof, MD - Primary  PHYSICIAN ASSISTANT:   ASSISTANTS: none   ANESTHESIA:   general  EBL:  Total I/O In: 1000 [I.V.:1000] Out: -   BLOOD ADMINISTERED:none  DRAINS: none   LOCAL MEDICATIONS USED:  MARCAINE     SPECIMEN:  Source of Specimen:  left breast tissue  DISPOSITION OF SPECIMEN:  PATHOLOGY  COUNTS:  YES  TOURNIQUET:  * No tourniquets in log *  DICTATION: .Dragon Dictation After informed consent was obtained patient was brought to the operating room placed in the supine position on the operating room table. After adequate induction of general anesthesia the patient's left breast was prepped with ChloraPrep, allowed to dry, and draped in usual sterile manner. A curvilinear incision was made along the edge of the areola in the upper outer quadrant of the left breast. This incision was carried through the skin and subcutaneous tissue sharply with electrocautery. Once into the breast tissue the mass was readily palpable. A circular portion of breast tissue was excised sharply around the mass with the electrocautery. Once the mass was removed it was oriented with a short single stitch on the superior surface, a long single stitch on the lateral surface, and a short double stitch on the deep surface. A specimen radiograph was obtained that showed the clip to be in the specimen. The specimen wasn't sent to pathology for further evaluation. The wound was irrigated with copious amounts of saline and infiltrated with quarter percent Marcaine. Hemostasis was achieved using the Bovie electrocautery. The deep layer the wound was closed with interrupted 3-0 Vicryl stitches. The skin was then closed with interrupted 4-0  Monocryl subcuticular stitches. Dermabond dressings were applied. The patient tolerated the procedure well. At the end of the case the needle sponge and instrument counts were correct. The patient was then awakened and taken to recovery in stable condition.  PLAN OF CARE: Discharge to home after PACU  PATIENT DISPOSITION:  PACU - hemodynamically stable.   Delay start of Pharmacological VTE agent (>24hrs) due to surgical blood loss or risk of bleeding: not applicable

## 2014-07-10 NOTE — H&P (View-Only) (Signed)
Patient ID: [name], female   DOB: 05/17/1986, 28 y.o.   MRN: [id number]  Chief Complaint  Patient presents with  . Eval L breast    HPI [name] is a 28 y.o. female.  We are asked to see the patient in consultation by Dr. Marcelo Baldy to evaluate her for a left breast mass. The patient is a 28 year old Hispanic female who first felt a lump centrally in her left breast about 4-5 weeks ago. The mass has been somewhat tender for at times. She has not had any discharge from her nipple. She has not had any history of breast problems nor any family history of breast problems. The area measured 1.9 cm by ultrasound. The area was biopsied and came back as fibrocystic tissue. The etiology is felt that this result was discordant  HPI  Past Medical History  Diagnosis Date  . Hypertension     Past Surgical History  Procedure Laterality Date  . Cesarean section      Family History  Problem Relation Age of Onset  . Diabetes Mother   . Hypertension Mother   . Diabetes Maternal Grandmother   . Hypertension Maternal Grandmother     Social History History  Substance Use Topics  . Smoking status: Never Smoker   . Smokeless tobacco: Not on file  . Alcohol Use: Yes     Comment: weekends only    No Known Allergies  Current Outpatient Prescriptions  Medication Sig Dispense Refill  . ibuprofen (ADVIL,MOTRIN) 600 MG tablet Take 1 tablet (600 mg total) by mouth every 6 (six) hours as needed for pain (pain scale < 4).  30 tablet  5   No current facility-administered medications for this visit.    Review of Systems Review of Systems  Constitutional: Negative.   HENT: Negative.   Eyes: Negative.   Respiratory: Negative.   Cardiovascular: Negative.   Gastrointestinal: Negative.   Endocrine: Negative.   Genitourinary: Negative.   Musculoskeletal: Negative.   Skin: Negative.   Allergic/Immunologic: Negative.   Neurological: Negative.   Hematological: Negative.    Psychiatric/Behavioral: Negative.     Blood pressure 90/68, pulse 75, temperature 96.6 F (35.9 C), height 5' (1.524 m), weight 113 lb (51.256 kg), last menstrual period 06/07/2014, SpO2 98.00%, not currently breastfeeding.  Physical Exam Physical Exam  Constitutional: She is oriented to person, place, and time. She appears well-developed and well-nourished.  HENT:  Head: Normocephalic and atraumatic.  Eyes: Conjunctivae and EOM are normal. Pupils are equal, round, and reactive to light.  Neck: Normal range of motion. Neck supple.  Cardiovascular: Normal rate, regular rhythm and normal heart sounds.   Pulmonary/Chest: Effort normal and breath sounds normal.  There is an approximately 2 cm mobile mass in the subareolar area on the left. There is also a small mobile palpable lymph node in the left axilla. There is no palpable mass in the right breast. There is no palpable axillary supraclavicular or cervical lymphadenopathy on the right  Abdominal: Soft. Bowel sounds are normal.  Musculoskeletal: Normal range of motion.  Lymphadenopathy:    She has no cervical adenopathy.  Neurological: She is alert and oriented to person, place, and time.  Skin: Skin is warm and dry.  Psychiatric: She has a normal mood and affect. Her behavior is normal.    Data Reviewed As above  Assessment    The patient has a mass centrally in the left breast which was biopsied and was benign but was felt to be discordant. Because  of this the recommendation would be for open biopsy of this area. I've discussed with her in detail the risks and benefits of the operation to do this as well as some of the technical aspects and she understands and wishes to proceed     Plan    Lab for left breast lumpectomy        TOTH III,Isham Smitherman S 06/24/2014, 9:16 AM

## 2014-07-10 NOTE — Discharge Instructions (Signed)

## 2014-07-10 NOTE — Anesthesia Procedure Notes (Signed)
Procedure Name: LMA Insertion Date/Time: 07/10/2014 12:16 PM Performed by: Lieutenant Diego Pre-anesthesia Checklist: Patient identified, Emergency Drugs available, Suction available and Patient being monitored Patient Re-evaluated:Patient Re-evaluated prior to inductionOxygen Delivery Method: Circle System Utilized Preoxygenation: Pre-oxygenation with 100% oxygen Intubation Type: IV induction Ventilation: Mask ventilation without difficulty LMA: LMA inserted LMA Size: 4.0 Number of attempts: 1 Airway Equipment and Method: bite block Placement Confirmation: positive ETCO2 and breath sounds checked- equal and bilateral Tube secured with: Tape Dental Injury: Teeth and Oropharynx as per pre-operative assessment

## 2014-07-10 NOTE — Interval H&P Note (Signed)
History and Physical Interval Note:  07/10/2014 11:32 AM  [name]  has presented today for surgery, with the diagnosis of left breast mass  The various methods of treatment have been discussed with the patient and family. After consideration of risks, benefits and other options for treatment, the patient has consented to  Procedure(s): LEFT BREAST LUMPECTOMY (Left) as a surgical intervention .  The patient's history has been reviewed, patient examined, no change in status, stable for surgery.  I have reviewed the patient's chart and labs.  Questions were answered to the patient's satisfaction.     TOTH III,Joeph Szatkowski S

## 2014-07-11 ENCOUNTER — Encounter (HOSPITAL_BASED_OUTPATIENT_CLINIC_OR_DEPARTMENT_OTHER): Payer: Self-pay | Admitting: General Surgery

## 2014-07-18 ENCOUNTER — Other Ambulatory Visit (INDEPENDENT_AMBULATORY_CARE_PROVIDER_SITE_OTHER): Payer: Self-pay

## 2014-07-18 DIAGNOSIS — C50912 Malignant neoplasm of unspecified site of left female breast: Secondary | ICD-10-CM

## 2014-07-23 ENCOUNTER — Other Ambulatory Visit (INDEPENDENT_AMBULATORY_CARE_PROVIDER_SITE_OTHER): Payer: Self-pay

## 2014-07-23 DIAGNOSIS — C50912 Malignant neoplasm of unspecified site of left female breast: Secondary | ICD-10-CM

## 2014-07-25 ENCOUNTER — Telehealth: Payer: Self-pay | Admitting: *Deleted

## 2014-07-25 NOTE — Telephone Encounter (Signed)
Called pt and she speaks spanish so she put her friend on the phone and confirmed 07/26/14 appt w/ her.  Checked and verified that the yes was marked for a interpretor and it is.  Unable to mail before letter - gave verbal to her friend. Unable to mail welcoming packet- gave address, directions and instructions.  Unable to mail intake form - placed a note for one to be given at time of check in.  Called and left a message for [employer] w/ [facility] to make aware of the appt.  Emailed Anderson [place] and [place] at [employer] to make them aware.  Informed Dawn so she could add pt to the conference list for Wed. 07/31/14.

## 2014-07-26 ENCOUNTER — Other Ambulatory Visit: Payer: Self-pay | Admitting: Hematology and Oncology

## 2014-07-26 ENCOUNTER — Ambulatory Visit: Payer: No Typology Code available for payment source

## 2014-07-26 ENCOUNTER — Encounter: Payer: Self-pay | Admitting: Hematology and Oncology

## 2014-07-26 ENCOUNTER — Ambulatory Visit (HOSPITAL_BASED_OUTPATIENT_CLINIC_OR_DEPARTMENT_OTHER): Payer: Self-pay | Admitting: Hematology and Oncology

## 2014-07-26 VITALS — BP 98/67 | HR 72 | Temp 98.2°F | Resp 18 | Ht 60.0 in | Wt 113.8 lb

## 2014-07-26 DIAGNOSIS — C50912 Malignant neoplasm of unspecified site of left female breast: Secondary | ICD-10-CM

## 2014-07-26 DIAGNOSIS — Z170 Estrogen receptor positive status [ER+]: Secondary | ICD-10-CM

## 2014-07-26 DIAGNOSIS — C50512 Malignant neoplasm of lower-outer quadrant of left female breast: Secondary | ICD-10-CM

## 2014-07-26 DIAGNOSIS — M25569 Pain in unspecified knee: Secondary | ICD-10-CM

## 2014-07-26 DIAGNOSIS — R109 Unspecified abdominal pain: Secondary | ICD-10-CM

## 2014-07-26 DIAGNOSIS — C50519 Malignant neoplasm of lower-outer quadrant of unspecified female breast: Secondary | ICD-10-CM

## 2014-07-26 NOTE — Progress Notes (Signed)
Checked in new pt with no insurance.  Pt is approved for [facility] and has filled out a financial application thru the hospital.  According to pt's [employer] pt is approved but per Tammy Taffer w/ pt accounting not sure what the pt is approved for because there is no documentation.  She's going to contact the financial counselors at the hospital for clarification.  I informed pt of the [place] and gave her my card for any questions or concerns.

## 2014-07-26 NOTE — Progress Notes (Signed)
Copy of MD note from office visit provided to pt.  Original sent to scan.  Faxed mammo and ultrasound dtd 06/06/14 to the [facility] center.  Bilateral MRI 9/12.  Genetics 9/15.  Per Tanna Savoy - interpreter is already scheduled to meet the patient at [facility].

## 2014-07-26 NOTE — Progress Notes (Signed)
[facility] CONSULT NOTE  Patient Care Team: No Pcp Per Patient as PCP - General (General Practice)  CHIEF COMPLAINTS/PURPOSE OF CONSULTATION:  Newly diagnosed breast cancer  HISTORY OF PRESENTING ILLNESS:  [name] 28 y.o. female is here because of recent diagnosis of left breast cancer. Patient has 2 children one daughter and one son. The son was born 2 years ago. Recently as of May she felt a lump in the left breast. Since it did not go away she brought it to the attention of physicians. The palpable left breast mass that was evaluated through imaging studies and it showed a 1.9 cm mass at 12:00 position an initial biopsy done 06/12/2014 revealed fibrocystic changes in the breast. 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 Superficial margin was positive. ER 58% PR 13% Ki-67 was 33% and HER-2 was amplified with ratio of 5.17 and gene copy #5.95. Patient is here today to discuss the different treatment options. She is here today accompanied by her significant other and is very emotional and tearful throughout the whole encounter. We used Spanish interpreter to communicate with the patient.  Patient's only complaints are mild lower abdominal pain and the left knee pain which is completely unrelated to her breast cancer diagnosis.  I reviewed her records extensively and collaborated the history with the patient.  SUMMARY OF ONCOLOGIC HISTORY:   Breast cancer of lower-outer quadrant of left female breast   06/06/2014 Imaging Palpable Left breast mass at 12:00 position 1.9 cm, initial biopsy 06/12/2014 revealed fibrocystic changes.   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 Cancer     left breast    SURGICAL HISTORY: Past Surgical History  Procedure Laterality Date  . Cesarean section    . Left breast biopsy    . Breast lumpectomy Left 07/10/2014    Procedure: LEFT BREAST LUMPECTOMY;  Surgeon: Merrie Roof, MD;  Location: [facility];  Service: General;  Laterality: Left;    SOCIAL HISTORY: History   Social History  . Marital Status: Married    Spouse Name: N/A    Number of Children: N/A  . Years of Education: N/A   Occupational History  . Not on file.   Social History Main Topics  . Smoking status: Former Research scientist (life sciences)  . Smokeless tobacco: Never Used     Comment: last smoked 2 years ago  . Alcohol Use: Yes     Comment: weekends only  . Drug Use: No  . Sexual Activity: Yes    Birth Control/ Protection: Condom   Other Topics Concern  . Not on file   Social History Narrative  . No narrative on file    FAMILY HISTORY: Family History  Problem Relation Age of Onset  . Diabetes Mother   . Hypertension Mother   . Diabetes Maternal Grandmother   . Hypertension Maternal Grandmother     ALLERGIES:  has No Known Allergies.  MEDICATIONS:  Current Outpatient Prescriptions  Medication Sig Dispense  Refill  . ibuprofen (ADVIL,MOTRIN) 600 MG tablet Take 1 tablet (600 mg total) by mouth every 6 (six) hours as needed for pain (pain scale < 4).  30 tablet  5  . oxyCODONE-acetaminophen (ROXICET) 5-325 MG per tablet Take 1-2 tablets by mouth every 4 (four) hours as needed.  50 tablet  0   No current facility-administered medications for this visit.    REVIEW OF SYSTEMS:   Constitutional: Denies fevers, chills or abnormal night sweats Eyes: Denies blurriness of vision, double vision or watery eyes Ears, nose, mouth, throat, and  face: Denies mucositis or sore throat Respiratory: Denies cough, dyspnea or wheezes Cardiovascular: Denies palpitation, chest discomfort or lower extremity swelling Gastrointestinal:  Denies nausea, heartburn or change in bowel habits Skin: Denies abnormal skin rashes Lymphatics: Denies new lymphadenopathy or easy bruising Neurological:Denies numbness, tingling or new weaknesses Behavioral/Psych: Mood is stable, no new changes  Breast: Since a lumpectomy she is still sore in the breast All other systems were reviewed with the patient and are negative.  PHYSICAL EXAMINATION: ECOG PERFORMANCE STATUS: 1 - Symptomatic but completely ambulatory  Filed Vitals:   07/26/14 1414  BP: 98/67  Pulse: 72  Temp: 98.2 F (36.8 C)  Resp: 18   Filed Weights   07/26/14 1414  Weight: 113 lb 12.8 oz (51.619 kg)    GENERAL:alert, no distress and comfortable SKIN: skin color, texture, turgor are normal, no rashes or significant lesions EYES: normal, conjunctiva are pink and non-injected, sclera clear OROPHARYNX:no exudate, no erythema and lips, buccal mucosa, and tongue normal  NECK: supple, thyroid normal size, non-tender, without nodularity LYMPH:  no palpable lymphadenopathy in the cervical, axillary or inguinal LUNGS: clear to auscultation and percussion with normal breathing effort HEART: regular rate & rhythm and no murmurs and no lower extremity edema ABDOMEN:abdomen soft, non-tender and normal bowel sounds Musculoskeletal:no cyanosis of digits and no clubbing  PSYCH: alert & oriented x 3 with fluent speech NEURO: no focal motor/sensory deficits   LABORATORY DATA:  I have reviewed the data as listed Lab Results  Component Value Date   WBC 7.8 08/30/2013   HGB 12.4 08/30/2013   HCT 37.2 08/30/2013   MCV 62.1* 08/30/2013   PLT 330 08/30/2013   Lab Results  Component Value Date   NA 135 08/30/2013   K 4.2 08/30/2013   CL 101 08/30/2013   CO2 25 08/30/2013    RADIOGRAPHIC  STUDIES: I have personally reviewed the radiological reports and agreed with the findings in the report.  ASSESSMENT AND PLAN:  Breast cancer of lower-outer quadrant of left female breast 1. 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 Discussed with the patient, the details of pathology including the type of breast cancer,the clinical staging, the significance of ER, PR and HER-2/neu receptors and the implications for treatment. After reviewing the pathology in detail, we proceeded to discuss the different treatment options between surgery, radiation, chemotherapy, antiestrogen therapies.  3. Patient understands that having HER-2 positivity signifies an aggressive cancer phenotype and that she would require adjuvant chemotherapy. I discussed with her briefly adjuvant chemoradiation with Taxotere carboplatin Herceptin once every 3 weeks for 6 cycles followed by Herceptin maintenance for a year in combination with oral antiestrogen therapy with tamoxifen. I did not go into extreme details of treatment side effects because she is fairly emotional and I wanted her to go through the surgery first  before discussing additional therapies.  4. We set her up for genetics consultation for Tuesday. She wants to know her BRCA mutation status prior to undergoing neck surgery. If she does not have any inherited gene mutations that she plans on doing a lumpectomy with sentinel lymph node study. Fully understanding that since the tumor was subareolar that she might lose the nipple. Alternatively she may decide to undergo mastectomy and lymph node exam.  5. Patient will come back to see [place] after undergoing surgery so that we can then discuss initiating adjuvant chemotherapy. At the time of surgery we will ask Dr.Blackman to insert a port.  6.Nonspecific abdominal pain and left knee pain: These have been  there for the last 2 days. Clinically there was no abnormalities there. If they're persistent we might need to evaluate this further.  All questions were answered. The patient knows to call the clinic with any problems, questions or concerns. I spent 55 minutes counseling the patient face to face. The total time spent in the appointment was 60 minutes and more than 50% was on counseling.     Rulon Eisenmenger, MD 07/26/2014 3:46 PM

## 2014-07-26 NOTE — Assessment & Plan Note (Signed)
1. 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 Discussed with the patient, the details of pathology including the type of breast cancer,the clinical staging, the significance of ER, PR and HER-2/neu receptors and the implications for treatment. After reviewing the pathology in detail, we proceeded to discuss the different treatment options between surgery, radiation, chemotherapy, antiestrogen therapies.  3. Patient understands that having HER-2 positivity signifies an aggressive cancer phenotype and that she would require adjuvant chemotherapy. I discussed with her briefly adjuvant chemoradiation with Taxotere carboplatin Herceptin once every 3 weeks for 6 cycles followed by Herceptin maintenance for a year in combination with oral antiestrogen therapy with tamoxifen. I did not go into extreme details of treatment side effects because she is fairly emotional and I wanted her to go through the surgery first before discussing additional therapies.  4. We set her up for genetics consultation for Tuesday. She wants to know her BRCA mutation status prior to undergoing neck surgery. If she does not have any inherited gene mutations that she plans on doing a lumpectomy with sentinel lymph node study. Fully understanding that since the tumor was subareolar that she might lose the nipple. Alternatively she may decide to undergo mastectomy and lymph node exam.  5. Patient will come back to see [place] after undergoing surgery so that we can then discuss initiating adjuvant chemotherapy. At the time of surgery we will ask Dr.Blackman to insert a port.

## 2014-07-27 ENCOUNTER — Ambulatory Visit
Admission: RE | Admit: 2014-07-27 | Discharge: 2014-07-27 | Disposition: A | Discharge status: Home / Self Care | Payer: No Typology Code available for payment source | Source: Ambulatory Visit | Attending: Hematology and Oncology | Admitting: Hematology and Oncology

## 2014-07-27 DIAGNOSIS — C50912 Malignant neoplasm of unspecified site of left female breast: Secondary | ICD-10-CM

## 2014-07-27 MED ORDER — GADOBENATE DIMEGLUMINE 529 MG/ML IV SOLN
10.0000 mL | Freq: Once | INTRAVENOUS | Status: AC | PRN
  Administered 2014-07-27: 10 mL via INTRAVENOUS

## 2014-07-30 ENCOUNTER — Other Ambulatory Visit: Payer: Self-pay

## 2014-08-02 ENCOUNTER — Ambulatory Visit (HOSPITAL_BASED_OUTPATIENT_CLINIC_OR_DEPARTMENT_OTHER): Payer: Self-pay | Admitting: Genetic Counselor

## 2014-08-02 ENCOUNTER — Other Ambulatory Visit: Payer: Self-pay

## 2014-08-02 ENCOUNTER — Encounter: Payer: Self-pay | Admitting: Genetic Counselor

## 2014-08-02 DIAGNOSIS — C50919 Malignant neoplasm of unspecified site of unspecified female breast: Secondary | ICD-10-CM | POA: Insufficient documentation

## 2014-08-02 DIAGNOSIS — C50512 Malignant neoplasm of lower-outer quadrant of left female breast: Secondary | ICD-10-CM

## 2014-08-02 DIAGNOSIS — C50519 Malignant neoplasm of lower-outer quadrant of unspecified female breast: Secondary | ICD-10-CM

## 2014-08-02 DIAGNOSIS — IMO0002 Reserved for concepts with insufficient information to code with codable children: Secondary | ICD-10-CM

## 2014-08-02 NOTE — Progress Notes (Signed)
Patient Name: [name] Patient Age: 28 y.o. Encounter Date: 08/02/2014  Referring Physician: Nicholas Lose, MD  Primary Care Provider: No PCP Per Patient   Ms. [name], a 28 y.o. female, is being seen at the [facility] due to a personal history of breast cancer.  She presents to clinic today with an interpreter Gregary Signs) to discuss the possibility of a hereditary predisposition to cancer and discuss whether genetic testing is warranted.  HISTORY OF PRESENT ILLNESS: [name] was recently diagnosed with left breast cancer (IDC/DCIS) at the age of 38. She has not yet had definitive surgery.  The breast tumor was ER positive, PR positive, and HER2 positive.    Breast cancer of lower-outer quadrant of left female breast   06/06/2014 Imaging Palpable Left breast mass at 12:00 position 1.9 cm, initial biopsy 06/12/2014 revealed fibrocystic changes.   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 Cancer     left breast  . Malignant neoplasm of breast (female), unspecified site     Past Surgical History  Procedure Laterality Date  . Cesarean section    . Left breast biopsy    . Breast lumpectomy Left 07/10/2014    Procedure: LEFT BREAST LUMPECTOMY;  Surgeon: Merrie Roof, MD;  Location: [facility];  Service: General;  Laterality: Left;    History   Social History  . Marital Status: Married    Spouse Name: N/A    Number of Children: N/A  . Years of Education: N/A   Social History Main Topics  . Smoking status: Former Research scientist (life sciences)  . Smokeless tobacco: Never Used     Comment: last smoked 2 years ago  . Alcohol Use: Yes     Comment: weekends only  . Drug Use: No  . Sexual  Activity: Yes    Birth Control/ Protection: Condom   Other Topics Concern  . Not on file   Social History Narrative  . No narrative on file     FAMILY HISTORY: During the visit, a 4-generation pedigree was obtained. [name] has one full sister (age 92) and 6 maternal half-siblings. [name]'s mother is 22 and cancer-free. Her mother has 3 brother and 3 sisters who are all younger than she is. Her maternal grandmother and grandfather both reportedly died at 61 with stomach cancer.  Her father is reportedly 46, but she has no information about him. She thinks he had 6 siblings. There is no information about her paternal grandparents.  [name]'s ancestry is Hispanic. There is no known Jewish ancestry and no consanguinity.   ASSESSMENT AND PLAN: [name] is a 28 y.o. female with a personal history of very early age-onset breast cancer. Given her age, this is concerning for a hereditary predisposition to cancer, possibly a TP53 mutation. While she has a negative family history, her maternal relatives are fairly young. She has no information about her father or any paternal relatives We reviewed the characteristics, features and inheritance patterns of hereditary cancer syndromes. We also discussed genetic testing, including the process of testing, insurance coverage and implications of results.   [name] wished to pursue genetic testing and a blood sample will be sent to [facility] for analysis. BRCAplus (BRCA1, BRCA2, CDH1, PTEN  and TP53) was ordered first to be able to get these results in 2 weeks and allow their use in surgical decision making. If this test is negative, the lab will proceed with analysis of the remaining genes on the BreastNext gene panel. We discussed the implications of a positive, negative and/ or Variant of Uncertain Significance (VUS) result. We will contact her after each result and address implications  for her as well as address genetic testing for at-risk family members, if needed.    We encouraged [name] to remain in contact with Cancer Genetics annually so that we can update the family history and inform her of any changes in cancer genetics and testing that may be of benefit for this family. Ms.  Cook's questions were answered to her satisfaction today.   Thank you for the referral and allowing [place] to share in the care of your patient.   The patient was seen with interpreter Gregary Signs) for a total of 35 minutes, greater than 50% of which was spent face-to-face counseling. This patient was discussed with the overseeing provider who agrees with the above.   Steele Berg, MS, [place] Certified Genetic Counseor phone: [phone] [email or web address]

## 2014-08-19 ENCOUNTER — Encounter: Payer: Self-pay | Admitting: Genetic Counselor

## 2014-08-19 ENCOUNTER — Telehealth: Payer: Self-pay

## 2014-08-19 NOTE — Progress Notes (Signed)
  Referring Physician: Nicholas Lose, MD   [name] was called today with the assistance of Gregary Signs (interpreter) to discuss the first of her genetic test results. Please see the Genetics note from her visit on 08/02/14.   GENETIC TESTING: At the time of Ms. [name]'s visit, we recommended she pursue genetic testing of multiple genes. Given the use of this information for surgical management, BRCAplus was ordered first. This test includs sequencing and deletion/duplication analysis of BRCA1, BRCA2, CDH1, PTEN, and p53 genes, and was performed at [facility]. Testing was normal and did not reveal a mutation in these genes. Testing for the other genes on the BreastNext panel is pending. Once results are obtained, [name] will be called again.   She was instructed to discuss this information with her physicians.   Steele Berg, MS, [place] Certified Genetic Counseor phone: [phone] [email or web address]

## 2014-08-19 NOTE — Telephone Encounter (Signed)
Fax sent to [facility] re: genetics results available for pt.  Sent to scan.

## 2014-08-22 ENCOUNTER — Encounter: Payer: Self-pay | Admitting: *Deleted

## 2014-08-22 NOTE — Progress Notes (Signed)
[place] Work  Clinical Social Work was referred by patient for assessment of psychosocial needs.  Pt does not speak [place], had a friend call that speaks some [place]. Clinical Social Worker attempted to assess for needs, but this was very difficult due to the language barrier. It appears pt may have been calling about an appointment. CSW contacted [place] interpreter, Lavon Paganini for assistance and awaits return call.     Loren Racer, [place] Worker [place]  [place] Phone: [phone] Fax: [phone]

## 2014-08-23 ENCOUNTER — Encounter: Payer: Self-pay | Admitting: *Deleted

## 2014-08-23 NOTE — Progress Notes (Signed)
[place] Work  Clinical Social Work was referred by patient for assessment of psychosocial needs.  Clinical Social Worker communicated with pt via interpreter to offer support and assess for needs.  Per interpreter, Lavon Paganini, pt's 27yo daughter is having issues coping with her mother's illness. Mother gave permission for CSW to contact school on her behalf. CSW phomed Office manager and spoke with [employer], Ms. Edwards who will follow up on the school level. CSW also checked with [employer] and they do provide interpreter services for the first appt with the caregiver. Lavon Paganini was made aware of these resources and relayed this information to the pt. Pt aware she will need to make her own appt with [employer] that suits her schedule. CSW will continue to follow and assist.   Clinical Social Work interventions: Resource education and coordination Pt advocacy  Loren Racer, [place]  [place] Phone: [phone] Fax: [phone]

## 2014-08-27 ENCOUNTER — Other Ambulatory Visit (INDEPENDENT_AMBULATORY_CARE_PROVIDER_SITE_OTHER): Payer: Self-pay | Admitting: General Surgery

## 2014-08-27 DIAGNOSIS — C50912 Malignant neoplasm of unspecified site of left female breast: Secondary | ICD-10-CM

## 2014-08-28 ENCOUNTER — Telehealth: Payer: Self-pay | Admitting: Hematology and Oncology

## 2014-08-28 DIAGNOSIS — C50512 Malignant neoplasm of lower-outer quadrant of left female breast: Secondary | ICD-10-CM

## 2014-09-04 ENCOUNTER — Encounter (HOSPITAL_COMMUNITY): Payer: Self-pay | Admitting: Pharmacy Technician

## 2014-09-04 ENCOUNTER — Encounter (HOSPITAL_COMMUNITY)
Admission: RE | Admit: 2014-09-04 | Discharge: 2014-09-04 | Disposition: A | Discharge status: Home / Self Care | Payer: No Typology Code available for payment source | Source: Ambulatory Visit | Attending: General Surgery | Admitting: General Surgery

## 2014-09-04 ENCOUNTER — Encounter (HOSPITAL_COMMUNITY): Payer: Self-pay

## 2014-09-04 DIAGNOSIS — C50512 Malignant neoplasm of lower-outer quadrant of left female breast: Secondary | ICD-10-CM | POA: Insufficient documentation

## 2014-09-04 DIAGNOSIS — Z01818 Encounter for other preprocedural examination: Secondary | ICD-10-CM | POA: Insufficient documentation

## 2014-09-04 HISTORY — DX: Anemia, unspecified: D64.9

## 2014-09-04 LAB — CBC
HCT: 33.7 % — ABNORMAL LOW (ref 36.0–46.0)
HEMOGLOBIN: 11 g/dL — AB (ref 12.0–15.0)
MCH: 20.2 pg — ABNORMAL LOW (ref 26.0–34.0)
MCHC: 32.6 g/dL (ref 30.0–36.0)
MCV: 61.9 fL — ABNORMAL LOW (ref 78.0–100.0)
Platelets: 290 10*3/uL (ref 150–400)
RBC: 5.44 MIL/uL — AB (ref 3.87–5.11)
RDW: 15 % (ref 11.5–15.5)
WBC: 7.5 10*3/uL (ref 4.0–10.5)

## 2014-09-04 LAB — BASIC METABOLIC PANEL
Anion gap: 10 (ref 5–15)
BUN: 7 mg/dL (ref 6–23)
CHLORIDE: 105 meq/L (ref 96–112)
CO2: 25 meq/L (ref 19–32)
Calcium: 9.5 mg/dL (ref 8.4–10.5)
Creatinine, Ser: 0.58 mg/dL (ref 0.50–1.10)
GFR calc Af Amer: >90 mL/min (ref >90)
GFR calc non Af Amer: >90 mL/min (ref >90)
GLUCOSE: 103 mg/dL — AB (ref 70–99)
Potassium: 3.7 mEq/L (ref 3.7–5.3)
SODIUM: 140 meq/L (ref 137–147)

## 2014-09-04 LAB — HCG, SERUM, QUALITATIVE: PREG SERUM: NEGATIVE

## 2014-09-04 NOTE — Pre-Procedure Instructions (Signed)
[name]  09/04/2014   Your procedure is scheduled on: Friday, September 06, 2014  Report to [facility] Admitting at 5:30 AM.  Call this number if you have problems the morning of surgery: [phone]   Remember:   Do not eat food or drink liquids after midnight Thursday, September 05, 2014   Take these medicines the morning of surgery with A SIP OF WATER: if needed: oxyCODONE  (ROXICET) for pain  Stop taking Aspirin, vitamins, and herbal medications. Do not take any NSAIDs ie: Ibuprofen, Motrin, Advil, Naproxen ( Aleve) or any medication containing Aspirin.   Do not wear jewelry, make-up or nail polish.  Do not wear lotions, powders, or perfumes. You may not wear deodorant.  Do not shave 48 hours prior to surgery.   Do not bring valuables to the hospital.  [facility] is not responsible for any belongings or valuables.               Contacts, dentures or bridgework may not be worn into surgery.  Leave suitcase in the car. After surgery it may be brought to your room.  For patients admitted to the hospital, discharge time is determined by your treatment team.               Patients discharged the day of surgery will not be allowed to drive home.  Name and phone number of your driver:  Special Instructions:  Special Instructions:Special Instructions: [facility] - Preparing for Surgery  Before surgery, you can play an important role.  Because skin is not sterile, your skin needs to be as free of germs as possible.  You can reduce the number of germs on you skin by washing with CHG (chlorahexidine gluconate) soap before surgery.  CHG is an antiseptic cleaner which kills germs and bonds with the skin to continue killing germs even after washing.  Please DO NOT use if you have an allergy to CHG or antibacterial soaps.  If your skin becomes reddened/irritated stop using the CHG and inform your nurse when you arrive at Short Stay.  Do not shave (including legs and  underarms) for at least 48 hours prior to the first CHG shower.  You may shave your face.  Please follow these instructions carefully:   1.  Shower with CHG Soap the night before surgery and the morning of Surgery.  2.  If you choose to wash your hair, wash your hair first as usual with your normal shampoo.  3.  After you shampoo, rinse your hair and body thoroughly to remove the Shampoo.  4.  Use CHG as you would any other liquid soap.  You can apply chg directly  to the skin and wash gently with scrungie or a clean washcloth.  5.  Apply the CHG Soap to your body ONLY FROM THE NECK DOWN.  Do not use on open wounds or open sores.  Avoid contact with your eyes, ears, mouth and genitals (private parts).  Wash genitals (private parts) with your normal soap.  6.  Wash thoroughly, paying special attention to the area where your surgery will be performed.  7.  Thoroughly rinse your body with warm water from the neck down.  8.  DO NOT shower/wash with your normal soap after using and rinsing off the CHG Soap.  9.  Pat yourself dry with a clean towel.            10.  Wear clean pajamas.  11.  Place clean sheets on your bed the night of your first shower and do not sleep with pets.  Day of Surgery  Do not apply any lotions/deodorants the morning of surgery.  Please wear clean clothes to the hospital/surgery center.   Please read over the following fact sheets that you were given: Pain Booklet, Coughing and Deep Breathing and Surgical Site Infection Prevention

## 2014-09-05 MED ORDER — CEFAZOLIN SODIUM-DEXTROSE 2-3 GM-% IV SOLR
2.0000 g | INTRAVENOUS | Status: AC
  Administered 2014-09-06: 2 g via INTRAVENOUS
  Filled 2014-09-05: qty 50

## 2014-09-05 MED ORDER — CHLORHEXIDINE GLUCONATE 4 % EX LIQD
1.0000 "application " | Freq: Once | CUTANEOUS | Status: DC
  Filled 2014-09-05: qty 15

## 2014-09-06 ENCOUNTER — Ambulatory Visit (HOSPITAL_COMMUNITY): Payer: Self-pay

## 2014-09-06 ENCOUNTER — Encounter (HOSPITAL_COMMUNITY): Payer: Self-pay | Admitting: *Deleted

## 2014-09-06 ENCOUNTER — Ambulatory Visit (HOSPITAL_COMMUNITY): Payer: Self-pay | Admitting: Anesthesiology

## 2014-09-06 ENCOUNTER — Encounter (HOSPITAL_COMMUNITY): Payer: Self-pay | Admitting: Anesthesiology

## 2014-09-06 ENCOUNTER — Ambulatory Visit (HOSPITAL_COMMUNITY)
Admission: RE | Admit: 2014-09-06 | Discharge: 2014-09-06 | Disposition: A | Discharge status: Home / Self Care | Payer: No Typology Code available for payment source | Source: Ambulatory Visit | Attending: General Surgery | Admitting: General Surgery

## 2014-09-06 ENCOUNTER — Ambulatory Visit (HOSPITAL_COMMUNITY)
Admission: RE | Admit: 2014-09-06 | Discharge: 2014-09-09 | Disposition: A | Discharge status: Home / Self Care | Payer: Self-pay | Source: Ambulatory Visit | Attending: General Surgery | Admitting: General Surgery

## 2014-09-06 ENCOUNTER — Encounter (HOSPITAL_COMMUNITY)
Admission: RE | Disposition: A | Discharge status: Home / Self Care | Payer: Self-pay | Source: Ambulatory Visit | Attending: General Surgery

## 2014-09-06 DIAGNOSIS — Z23 Encounter for immunization: Secondary | ICD-10-CM | POA: Insufficient documentation

## 2014-09-06 DIAGNOSIS — C773 Secondary and unspecified malignant neoplasm of axilla and upper limb lymph nodes: Secondary | ICD-10-CM | POA: Insufficient documentation

## 2014-09-06 DIAGNOSIS — C50912 Malignant neoplasm of unspecified site of left female breast: Secondary | ICD-10-CM

## 2014-09-06 DIAGNOSIS — Z95828 Presence of other vascular implants and grafts: Secondary | ICD-10-CM

## 2014-09-06 DIAGNOSIS — C50112 Malignant neoplasm of central portion of left female breast: Secondary | ICD-10-CM | POA: Insufficient documentation

## 2014-09-06 DIAGNOSIS — D0512 Intraductal carcinoma in situ of left breast: Secondary | ICD-10-CM | POA: Insufficient documentation

## 2014-09-06 DIAGNOSIS — Z170 Estrogen receptor positive status [ER+]: Secondary | ICD-10-CM | POA: Insufficient documentation

## 2014-09-06 DIAGNOSIS — C50919 Malignant neoplasm of unspecified site of unspecified female breast: Secondary | ICD-10-CM | POA: Diagnosis present

## 2014-09-06 DIAGNOSIS — D649 Anemia, unspecified: Secondary | ICD-10-CM | POA: Insufficient documentation

## 2014-09-06 DIAGNOSIS — R51 Headache: Secondary | ICD-10-CM | POA: Insufficient documentation

## 2014-09-06 HISTORY — PX: MASTECTOMY W/ SENTINEL NODE BIOPSY: SHX2001

## 2014-09-06 HISTORY — PX: PORTACATH PLACEMENT: SHX2246

## 2014-09-06 SURGERY — INSERTION, TUNNELED CENTRAL VENOUS DEVICE, WITH PORT
Anesthesia: Regional | Site: Chest | Laterality: Right

## 2014-09-06 MED ORDER — LIDOCAINE HCL (CARDIAC) 20 MG/ML IV SOLN
INTRAVENOUS | Status: AC
  Filled 2014-09-06: qty 5

## 2014-09-06 MED ORDER — HYDROMORPHONE HCL 1 MG/ML IJ SOLN
INTRAMUSCULAR | Status: AC
  Filled 2014-09-06: qty 1

## 2014-09-06 MED ORDER — HEPARIN SODIUM (PORCINE) 5000 UNIT/ML IJ SOLN
5000.0000 [IU] | Freq: Three times a day (TID) | INTRAMUSCULAR | Status: DC
  Administered 2014-09-07 – 2014-09-09 (×7): 5000 [IU] via SUBCUTANEOUS
  Filled 2014-09-06 (×12): qty 1

## 2014-09-06 MED ORDER — OXYCODONE-ACETAMINOPHEN 5-325 MG PO TABS
1.0000 | ORAL_TABLET | ORAL | Status: DC | PRN
  Administered 2014-09-06 – 2014-09-09 (×15): 2 via ORAL
  Filled 2014-09-06 (×15): qty 2

## 2014-09-06 MED ORDER — SODIUM CHLORIDE 0.9 % IR SOLN
Status: DC | PRN
  Administered 2014-09-06: 08:00:00

## 2014-09-06 MED ORDER — TECHNETIUM TC 99M SULFUR COLLOID FILTERED
1.0000 | Freq: Once | INTRAVENOUS | Status: AC | PRN
  Administered 2014-09-06: 1 via INTRADERMAL

## 2014-09-06 MED ORDER — FENTANYL CITRATE 0.05 MG/ML IJ SOLN
INTRAMUSCULAR | Status: AC
  Filled 2014-09-06: qty 5

## 2014-09-06 MED ORDER — ONDANSETRON HCL 4 MG/2ML IJ SOLN
INTRAMUSCULAR | Status: AC
  Filled 2014-09-06: qty 2

## 2014-09-06 MED ORDER — KCL IN DEXTROSE-NACL 20-5-0.9 MEQ/L-%-% IV SOLN
INTRAVENOUS | Status: DC
  Administered 2014-09-06 – 2014-09-07 (×2): via INTRAVENOUS
  Filled 2014-09-06 (×3): qty 1000

## 2014-09-06 MED ORDER — ONDANSETRON HCL 4 MG/2ML IJ SOLN: 4.0000 mg | Freq: Four times a day (QID) | INTRAMUSCULAR | Status: DC | PRN

## 2014-09-06 MED ORDER — MIDAZOLAM HCL 2 MG/2ML IJ SOLN
INTRAMUSCULAR | Status: AC
  Filled 2014-09-06: qty 2

## 2014-09-06 MED ORDER — EPHEDRINE SULFATE 50 MG/ML IJ SOLN
INTRAMUSCULAR | Status: DC | PRN
  Administered 2014-09-06 (×2): 10 mg via INTRAVENOUS

## 2014-09-06 MED ORDER — ROCURONIUM BROMIDE 50 MG/5ML IV SOLN
INTRAVENOUS | Status: AC
  Filled 2014-09-06: qty 1

## 2014-09-06 MED ORDER — CEFAZOLIN SODIUM-DEXTROSE 2-3 GM-% IV SOLR
INTRAVENOUS | Status: AC
  Filled 2014-09-06: qty 50

## 2014-09-06 MED ORDER — SCOPOLAMINE 1 MG/3DAYS TD PT72: 1.0000 | MEDICATED_PATCH | Freq: Once | TRANSDERMAL | Status: DC

## 2014-09-06 MED ORDER — LIDOCAINE HCL (CARDIAC) 20 MG/ML IV SOLN
INTRAVENOUS | Status: DC | PRN
  Administered 2014-09-06: 40 mg via INTRAVENOUS

## 2014-09-06 MED ORDER — PHENYLEPHRINE HCL 10 MG/ML IJ SOLN
INTRAMUSCULAR | Status: DC | PRN
  Administered 2014-09-06: 80 ug via INTRAVENOUS
  Administered 2014-09-06: 120 ug via INTRAVENOUS
  Administered 2014-09-06 (×2): 80 ug via INTRAVENOUS

## 2014-09-06 MED ORDER — PROPOFOL 10 MG/ML IV BOLUS
INTRAVENOUS | Status: DC | PRN
  Administered 2014-09-06: 100 mg via INTRAVENOUS

## 2014-09-06 MED ORDER — ONDANSETRON HCL 4 MG/2ML IJ SOLN
INTRAMUSCULAR | Status: DC | PRN
  Administered 2014-09-06: 4 mg via INTRAVENOUS

## 2014-09-06 MED ORDER — 0.9 % SODIUM CHLORIDE (POUR BTL) OPTIME
TOPICAL | Status: DC | PRN
  Administered 2014-09-06: 1000 mL

## 2014-09-06 MED ORDER — ROPIVACAINE HCL 5 MG/ML IJ SOLN
INTRAMUSCULAR | Status: DC | PRN
  Administered 2014-09-06: 30 mL

## 2014-09-06 MED ORDER — METHYLENE BLUE 1 % INJ SOLN
INTRAMUSCULAR | Status: AC
  Filled 2014-09-06: qty 10

## 2014-09-06 MED ORDER — INFLUENZA VAC SPLIT QUAD 0.5 ML IM SUSY
0.5000 mL | PREFILLED_SYRINGE | INTRAMUSCULAR | Status: AC
  Administered 2014-09-07: 0.5 mL via INTRAMUSCULAR
  Filled 2014-09-06: qty 0.5

## 2014-09-06 MED ORDER — SODIUM CHLORIDE 0.9 % IJ SOLN
INTRAMUSCULAR | Status: AC
  Filled 2014-09-06: qty 10

## 2014-09-06 MED ORDER — HYDROMORPHONE HCL 1 MG/ML IJ SOLN
0.2500 mg | INTRAMUSCULAR | Status: DC | PRN
  Administered 2014-09-06 (×4): 0.5 mg via INTRAVENOUS

## 2014-09-06 MED ORDER — OXYCODONE HCL 5 MG PO TABS: 5.0000 mg | ORAL_TABLET | Freq: Once | ORAL | Status: DC | PRN

## 2014-09-06 MED ORDER — PHENYLEPHRINE 40 MCG/ML (10ML) SYRINGE FOR IV PUSH (FOR BLOOD PRESSURE SUPPORT)
PREFILLED_SYRINGE | INTRAVENOUS | Status: AC
  Filled 2014-09-06: qty 10

## 2014-09-06 MED ORDER — MIDAZOLAM HCL 5 MG/5ML IJ SOLN
INTRAMUSCULAR | Status: DC | PRN
  Administered 2014-09-06: 2 mg via INTRAVENOUS

## 2014-09-06 MED ORDER — FENTANYL CITRATE 0.05 MG/ML IJ SOLN
INTRAMUSCULAR | Status: DC | PRN
  Administered 2014-09-06 (×5): 25 ug via INTRAVENOUS
  Administered 2014-09-06: 50 ug via INTRAVENOUS
  Administered 2014-09-06: 75 ug via INTRAVENOUS

## 2014-09-06 MED ORDER — LACTATED RINGERS IV SOLN
INTRAVENOUS | Status: DC | PRN
  Administered 2014-09-06 (×2): via INTRAVENOUS

## 2014-09-06 MED ORDER — DEXAMETHASONE SODIUM PHOSPHATE 4 MG/ML IJ SOLN
INTRAMUSCULAR | Status: DC | PRN
  Administered 2014-09-06: 8 mg via INTRAVENOUS

## 2014-09-06 MED ORDER — HEPARIN SOD (PORK) LOCK FLUSH 100 UNIT/ML IV SOLN
INTRAVENOUS | Status: DC | PRN
  Administered 2014-09-06: 500 [IU] via INTRAVENOUS

## 2014-09-06 MED ORDER — SCOPOLAMINE 1 MG/3DAYS TD PT72
MEDICATED_PATCH | TRANSDERMAL | Status: AC
  Filled 2014-09-06: qty 1

## 2014-09-06 MED ORDER — MORPHINE SULFATE 4 MG/ML IJ SOLN: 4.0000 mg | INTRAMUSCULAR | Status: DC | PRN

## 2014-09-06 MED ORDER — ONDANSETRON HCL 4 MG PO TABS: 4.0000 mg | ORAL_TABLET | Freq: Four times a day (QID) | ORAL | Status: DC | PRN

## 2014-09-06 MED ORDER — OXYCODONE HCL 5 MG/5ML PO SOLN: 5.0000 mg | Freq: Once | ORAL | Status: DC | PRN

## 2014-09-06 MED ORDER — SCOPOLAMINE 1 MG/3DAYS TD PT72SCOPOLAMINE 1 MG/3DAYS
MEDICATED_PATCH | TRANSDERMAL | Status: DC | PRN
Start: 2014-09-06 — End: 2014-09-06
  Administered 2014-09-06: 1 via TRANSDERMAL

## 2014-09-06 MED ORDER — PROPOFOL 10 MG/ML IV BOLUS
INTRAVENOUS | Status: AC
  Filled 2014-09-06: qty 20

## 2014-09-06 MED ORDER — HEPARIN SOD (PORK) LOCK FLUSH 100 UNIT/ML IV SOLN
INTRAVENOUS | Status: AC
  Filled 2014-09-06: qty 5

## 2014-09-06 MED ORDER — BUPIVACAINE HCL (PF) 0.25 % IJ SOLN
INTRAMUSCULAR | Status: AC
  Filled 2014-09-06: qty 30

## 2014-09-06 MED ORDER — SODIUM CHLORIDE 0.9 % IV SOLN
Freq: Once | INTRAVENOUS | Status: AC
  Administered 2014-09-06: 250 mL via INTRAVENOUS

## 2014-09-06 MED ORDER — BUPIVACAINE HCL (PF) 0.25 % IJ SOLN
INTRAMUSCULAR | Status: DC | PRN
  Administered 2014-09-06: 30 mL

## 2014-09-06 SURGICAL SUPPLY — 77 items
APPLIER CLIP 9.375 MED OPEN (MISCELLANEOUS) ×12
BAG DECANTER FOR FLEXI CONT (MISCELLANEOUS) ×4 IMPLANT
BINDER BREAST LRG (GAUZE/BANDAGES/DRESSINGS) ×4 IMPLANT
BINDER BREAST XLRG (GAUZE/BANDAGES/DRESSINGS) IMPLANT
BLADE SURG 11 STRL SS (BLADE) ×4 IMPLANT
BLADE SURG 15 STRL LF DISP TIS (BLADE) ×2 IMPLANT
BLADE SURG 15 STRL SS (BLADE) ×2
CANISTER SUCTION 2500CC (MISCELLANEOUS) ×8 IMPLANT
CHLORAPREP W/TINT 26ML (MISCELLANEOUS) ×8 IMPLANT
CLIP APPLIE 9.375 MED OPEN (MISCELLANEOUS) ×6 IMPLANT
CONT SPEC 4OZ CLIKSEAL STRL BL (MISCELLANEOUS) ×8 IMPLANT
COVER PROBE W GEL 5X96 (DRAPES) ×4 IMPLANT
COVER SURGICAL LIGHT HANDLE (MISCELLANEOUS) ×4 IMPLANT
CRADLE DONUT ADULT HEAD (MISCELLANEOUS) ×4 IMPLANT
DECANTER SPIKE VIAL GLASS SM (MISCELLANEOUS) ×4 IMPLANT
DERMABOND ADVANCED (GAUZE/BANDAGES/DRESSINGS) ×4
DERMABOND ADVANCED .7 DNX12 (GAUZE/BANDAGES/DRESSINGS) ×4 IMPLANT
DEVICE DISSECT PLASMABLAD 3.0S (MISCELLANEOUS) ×2 IMPLANT
DRAIN CHANNEL 19F RND (DRAIN) ×8 IMPLANT
DRAPE C-ARM 42X72 X-RAY (DRAPES) ×4 IMPLANT
DRAPE CHEST BREAST 15X10 FENES (DRAPES) ×4 IMPLANT
DRAPE LAPAROSCOPIC ABDOMINAL (DRAPES) ×4 IMPLANT
DRAPE SURG 17X23 STRL (DRAPES) ×8 IMPLANT
DRAPE UTILITY 15X26 W/TAPE STR (DRAPE) ×16 IMPLANT
DRSG PAD ABDOMINAL 8X10 ST (GAUZE/BANDAGES/DRESSINGS) ×4 IMPLANT
ELECT CAUTERY BLADE 6.4 (BLADE) ×4 IMPLANT
ELECT REM PT RETURN 9FT ADLT (ELECTROSURGICAL) ×4
ELECTRODE REM PT RTRN 9FT ADLT (ELECTROSURGICAL) ×2 IMPLANT
EVACUATOR SILICONE 100CC (DRAIN) ×8 IMPLANT
GAUZE SPONGE 4X4 12PLY STRL (GAUZE/BANDAGES/DRESSINGS) ×4 IMPLANT
GAUZE SPONGE 4X4 16PLY XRAY LF (GAUZE/BANDAGES/DRESSINGS) ×4 IMPLANT
GAUZE XEROFORM 5X9 LF (GAUZE/BANDAGES/DRESSINGS) ×4 IMPLANT
GLOVE BIO SURGEON STRL SZ7.5 (GLOVE) ×12 IMPLANT
GLOVE BIOGEL PI IND STRL 6.5 (GLOVE) ×4 IMPLANT
GLOVE BIOGEL PI IND STRL 7.0 (GLOVE) ×2 IMPLANT
GLOVE BIOGEL PI IND STRL 7.5 (GLOVE) ×6 IMPLANT
GLOVE BIOGEL PI INDICATOR 6.5 (GLOVE) ×4
GLOVE BIOGEL PI INDICATOR 7.0 (GLOVE) ×2
GLOVE BIOGEL PI INDICATOR 7.5 (GLOVE) ×6
GLOVE ECLIPSE 6.5 STRL STRAW (GLOVE) ×8 IMPLANT
GOWN STRL REUS W/ TWL LRG LVL3 (GOWN DISPOSABLE) ×12 IMPLANT
GOWN STRL REUS W/TWL LRG LVL3 (GOWN DISPOSABLE) ×12
KIT BASIN OR (CUSTOM PROCEDURE TRAY) ×4 IMPLANT
KIT PORT POWER ISP 8FR (Catheter) ×4 IMPLANT
KIT POWER CATH 8FR (Catheter) IMPLANT
KIT ROOM TURNOVER OR (KITS) ×4 IMPLANT
NEEDLE 18GX1X1/2 (RX/OR ONLY) (NEEDLE) IMPLANT
NEEDLE 22X1 1/2 (OR ONLY) (NEEDLE) IMPLANT
NEEDLE HYPO 25GX1X1/2 BEV (NEEDLE) ×4 IMPLANT
NS IRRIG 1000ML POUR BTL (IV SOLUTION) ×4 IMPLANT
PACK GENERAL/GYN (CUSTOM PROCEDURE TRAY) ×4 IMPLANT
PACK SURGICAL SETUP 50X90 (CUSTOM PROCEDURE TRAY) ×4 IMPLANT
PAD ABD 8X10 STRL (GAUZE/BANDAGES/DRESSINGS) ×4 IMPLANT
PAD ARMBOARD 7.5X6 YLW CONV (MISCELLANEOUS) ×4 IMPLANT
PENCIL BUTTON HOLSTER BLD 10FT (ELECTRODE) ×8 IMPLANT
PLASMABLADE 3.0S (MISCELLANEOUS) ×4
SPECIMEN JAR X LARGE (MISCELLANEOUS) ×4 IMPLANT
SUT ETHILON 3 0 FSL (SUTURE) ×4 IMPLANT
SUT ETHILON 3 0 PS 1 (SUTURE) ×4 IMPLANT
SUT MNCRL AB 4-0 PS2 18 (SUTURE) ×4 IMPLANT
SUT MON AB 4-0 PC3 18 (SUTURE) ×4 IMPLANT
SUT PROLENE 2 0 SH 30 (SUTURE) ×8 IMPLANT
SUT SILK 2 0 (SUTURE)
SUT SILK 2-0 18XBRD TIE 12 (SUTURE) IMPLANT
SUT VIC AB 3-0 54X BRD REEL (SUTURE) IMPLANT
SUT VIC AB 3-0 BRD 54 (SUTURE)
SUT VIC AB 3-0 SH 18 (SUTURE) ×4 IMPLANT
SUT VIC AB 3-0 SH 27 (SUTURE) ×2
SUT VIC AB 3-0 SH 27XBRD (SUTURE) ×2 IMPLANT
SYR 20ML ECCENTRIC (SYRINGE) ×8 IMPLANT
SYR 5ML LUER SLIP (SYRINGE) ×4 IMPLANT
SYR CONTROL 10ML LL (SYRINGE) ×4 IMPLANT
SYRINGE 10CC LL (SYRINGE) IMPLANT
TOWEL OR 17X24 6PK STRL BLUE (TOWEL DISPOSABLE) ×4 IMPLANT
TOWEL OR 17X26 10 PK STRL BLUE (TOWEL DISPOSABLE) ×4 IMPLANT
TUBE CONNECTING 12'X1/4 (SUCTIONS) ×1
TUBE CONNECTING 12X1/4 (SUCTIONS) ×3 IMPLANT

## 2014-09-06 NOTE — H&P (Signed)
[name] 08/27/2014 11:54 AM Location: [facility] Patient #: [id number] DOB: [birth year] Married / Language: Spanish / Race: Refused to Report/Unreported Female  History of Present Illness Sammuel Hines. Marlou Starks MD; 08/27/2014 12:46 PM) Patient words: Discuss breast surgery.  The patient is a 28 year old female who presents for a follow-up for breast cancer. The patient is a 28 year old Hispanic female who has what appears to be a locally advanced left breast cancer. She will need a left mastectomy and sentinel node mapping as well as a Port-A-Cath placement. She returns today to talk about the details of surgery and ask any questions.   Review of Systems Eddie Dibbles S. Marlou Starks MD; 08/27/2014 12:46 PM) General Not Present- Appetite Loss, Chills, Fatigue, Fever, Night Sweats, Weight Gain and Weight Loss. Skin Not Present- Change in Wart/Mole, Dryness, Hives, Jaundice, New Lesions, Non-Healing Wounds, Rash and Ulcer. HEENT Not Present- Earache, Hearing Loss, Hoarseness, Nose Bleed, Oral Ulcers, Ringing in the Ears, Seasonal Allergies, Sinus Pain, Sore Throat, Visual Disturbances, Wears glasses/contact lenses and Yellow Eyes. Respiratory Not Present- Bloody sputum, Chronic Cough, Difficulty Breathing, Snoring and Wheezing. Breast Not Present- Breast Mass, Breast Pain, Nipple Discharge and Skin Changes. Cardiovascular Not Present- Chest Pain, Difficulty Breathing Lying Down, Leg Cramps, Palpitations, Rapid Heart Rate, Shortness of Breath and Swelling of Extremities. Gastrointestinal Not Present- Abdominal Pain, Bloating, Bloody Stool, Change in Bowel Habits, Chronic diarrhea, Constipation, Difficulty Swallowing, Excessive gas, Gets full quickly at meals, Hemorrhoids, Indigestion, Nausea, Rectal Pain and Vomiting. Female Genitourinary Not Present- Frequency, Nocturia, Painful Urination, Pelvic Pain and Urgency. Musculoskeletal Not Present- Back Pain, Joint Pain, Joint Stiffness, Muscle Pain,  Muscle Weakness and Swelling of Extremities. Neurological Not Present- Decreased Memory, Fainting, Headaches, Numbness, Seizures, Tingling, Tremor, Trouble walking and Weakness. Psychiatric Not Present- Anxiety, Bipolar, Change in Sleep Pattern, Depression, Fearful and Frequent crying. Endocrine Not Present- Cold Intolerance, Excessive Hunger, Hair Changes, Heat Intolerance, Hot flashes and New Diabetes. Hematology Not Present- Easy Bruising, Excessive bleeding, Gland problems, HIV and Persistent Infections.   Vitals Briant Cedar CMA; 08/27/2014 11:57 AM) 08/27/2014 11:56 AM Weight: 111.25 lb Height: 60in Body Surface Area: 1.46 m Body Mass Index: 21.73 kg/m Temp.: 98.11F  Pulse: 74 (Regular)  BP: 98/60 (Sitting, Left Arm, Standard)    Physical Exam Eddie Dibbles S. Marlou Starks MD; 08/27/2014 12:47 PM) General Mental Status-Alert. General Appearance-Consistent with stated age. Hydration-Well hydrated. Voice-Normal.  Head and Neck Head-normocephalic, atraumatic with no lesions or palpable masses. Trachea-midline. Thyroid Gland Characteristics - normal size and consistency.  Eye Eyeball - Bilateral-Extraocular movements intact. Sclera/Conjunctiva - Bilateral-No scleral icterus.  Chest and Lung Exam Chest and lung exam reveals -quiet, even and easy respiratory effort with no use of accessory muscles and on auscultation, normal breath sounds, no adventitious sounds and normal vocal resonance. Inspection Chest Wall - Normal. Back - normal.  Breast Note: There is some palpable fullness in the central lateral portion of the left breast as well as some palpably enlarged left axillary lymph nodes   Cardiovascular Cardiovascular examination reveals -normal heart sounds, regular rate and rhythm with no murmurs and normal pedal pulses bilaterally.  Abdomen Inspection Inspection of the abdomen reveals - No Hernias. Skin - Scar - no surgical  scars. Palpation/Percussion Palpation and Percussion of the abdomen reveal - Soft, Non Tender, No Rebound tenderness, No Rigidity (guarding) and No hepatosplenomegaly. Auscultation Auscultation of the abdomen reveals - Bowel sounds normal.  Neurologic Neurologic evaluation reveals -alert and oriented x 3 with no impairment of recent or remote memory.  Mental Status-Normal.  Musculoskeletal Normal Exam - Left-Upper Extremity Strength Normal and Lower Extremity Strength Normal. Normal Exam - Right-Upper Extremity Strength Normal and Lower Extremity Strength Normal.  Lymphatic Head & Neck  General Head & Neck Lymphatics: Bilateral - Description - Normal. Axillary  General Axillary Region: Bilateral - Description - Normal. Tenderness - Non Tender. Femoral & Inguinal  Generalized Femoral & Inguinal Lymphatics: Bilateral - Description - Normal. Tenderness - Non Tender.    Assessment & Plan Eddie Dibbles S. Marlou Starks MD; 08/27/2014 12:47 PM) MALIGNANT NEOPLASM OF CENTRAL PORTION OF LEFT FEMALE BREAST (174.1  C50.112) Impression: The patient has a new diagnosed left breast cancer involving the central portion of the left breast. She does have some enlarged left axillary lymph nodes but it is not clear whether these are significant or whether this is reactive to her previous surgery. Given the large size of the tumor in her small breast size I think she would be best served with a left mastectomy and sentinel node mapping. She will also need a Port-A-Cath placed at the time of surgery. She is in agreement with this treatment plan. I have discussed with her in detail the risks and benefits of the operation as well as some of the technical aspects and she understands and wishes to proceed     Signed by Luella Cook, MD (08/27/2014 12:48 PM)

## 2014-09-06 NOTE — Anesthesia Postprocedure Evaluation (Signed)
  Anesthesia Post-op Note  Patient: [name]  Procedure(s) Performed: Procedure(s): INSERTION PORT-A-CATH (Right) LEFT MASTECTOMY WITH SENTINEL LYMPH NODE BIOPSY/NODE MAPPING (Left)  Patient Location: PACU  Anesthesia Type:General and block  Level of Consciousness: awake and alert   Airway and Oxygen Therapy: Patient Spontanous Breathing  Post-op Pain: moderate  Post-op Assessment: Post-op Vital signs reviewed, Patient's Cardiovascular Status Stable and Respiratory Function Stable  Post-op Vital Signs: Reviewed  Filed Vitals:   09/06/14 1145  BP: 98/66  Pulse: 81  Temp:   Resp: 13    Complications: No apparent anesthesia complications

## 2014-09-06 NOTE — Anesthesia Preprocedure Evaluation (Addendum)
Anesthesia Evaluation  Patient identified by MRN, date of birth, ID band Patient awake    Reviewed: Allergy & Precautions, H&P , NPO status , Patient's Chart, lab work & pertinent test results  Airway Mallampati: II TM Distance: >3 FB Neck ROM: Full    Dental no notable dental hx. (+) Teeth Intact, Dental Advisory Given   Pulmonary neg pulmonary ROS,  breath sounds clear to auscultation  Pulmonary exam normal       Cardiovascular negative cardio ROS  Rhythm:Regular Rate:Normal     Neuro/Psych negative neurological ROS  negative psych ROS   GI/Hepatic negative GI ROS, Neg liver ROS,   Endo/Other  negative endocrine ROS  Renal/GU negative Renal ROS  negative genitourinary   Musculoskeletal   Abdominal   Peds  Hematology negative hematology ROS (+) anemia ,   Anesthesia Other Findings   Reproductive/Obstetrics negative OB ROS                          Anesthesia Physical Anesthesia Plan  ASA: II  Anesthesia Plan: General and Regional   Post-op Pain Management:    Induction: Intravenous  Airway Management Planned: LMA  Additional Equipment:   Intra-op Plan:   Post-operative Plan: Extubation in OR  Informed Consent: I have reviewed the patients History and Physical, chart, labs and discussed the procedure including the risks, benefits and alternatives for the proposed anesthesia with the patient or authorized representative who has indicated his/her understanding and acceptance.   Dental advisory given  Plan Discussed with: CRNA  Anesthesia Plan Comments:         Anesthesia Quick Evaluation

## 2014-09-06 NOTE — Interval H&P Note (Signed)
History and Physical Interval Note:  09/06/2014 7:09 AM  [name]  has presented today for surgery, with the diagnosis of Left Breast Cancer  The various methods of treatment have been discussed with the patient and family. After consideration of risks, benefits and other options for treatment, the patient has consented to  Procedure(s): LEFT MASTECTOMY WITH SENTINEL LYMPH NODE BIOPSY (Left) INSERTION PORT-A-CATH (Left) as a surgical intervention .  The patient's history has been reviewed, patient examined, no change in status, stable for surgery.  I have reviewed the patient's chart and labs.  Questions were answered to the patient's satisfaction.     TOTH III,PAUL S

## 2014-09-06 NOTE — Anesthesia Procedure Notes (Addendum)
Anesthesia Regional Block:  Pectoralis block  Pre-Anesthetic Checklist: ,, timeout performed, Correct Patient, Correct Site, Correct Laterality, Correct Procedure, Correct Position, site marked, Risks and benefits discussed, pre-op evaluation, post-op pain management  Laterality: Left  Prep: Maximum Sterile Barrier Precautions used and chloraprep       Needles:  Injection technique: Single-shot  Needle Type: Echogenic Stimulator Needle     Needle Length: 9cm 9 cm Needle Gauge: 21 and 21 G    Additional Needles:  Procedures: ultrasound guided (picture in chart) Pectoralis block Narrative:  Start time: 09/06/2014 7:19 AM End time: 09/06/2014 7:28 AM Injection made incrementally with aspirations every 5 mL. Anesthesiologist: Shermaine Rivet,MD  Additional Notes: 2% Lidocaine skin wheel.

## 2014-09-06 NOTE — Transfer of Care (Signed)
Immediate Anesthesia Transfer of Care Note  Patient: [name]  Procedure(s) Performed: Procedure(s): INSERTION PORT-A-CATH (Right) LEFT MASTECTOMY WITH SENTINEL LYMPH NODE BIOPSY/NODE MAPPING (Left)  Patient Location: PACU  Anesthesia Type:GA combined with regional for post-op pain  Level of Consciousness: awake, alert , oriented and patient cooperative  Airway & Oxygen Therapy: Patient Spontanous Breathing and Patient connected to nasal cannula oxygen  Post-op Assessment: Report given to PACU RN and Post -op Vital signs reviewed and stable  Post vital signs: Reviewed and stable  Complications: No apparent anesthesia complications

## 2014-09-06 NOTE — Progress Notes (Signed)
Left message with Toniann Ket in financial counseling to return call re: medication assistance.

## 2014-09-06 NOTE — Op Note (Signed)
09/06/2014  10:46 AM  PATIENT:  [name]  28 y.o. female  PRE-OPERATIVE DIAGNOSIS:  Left Breast Cancer  POST-OPERATIVE DIAGNOSIS:  Left Breast Cancer  PROCEDURE:  Procedure(s): INSERTION PORT-A-CATH (Right) LEFT MODIFIED RADICAL MASTECTOMY  SURGEON:  Surgeon(s) and Role:    * Autumn Messing III, MD - Primary  PHYSICIAN ASSISTANT:   ASSISTANTS: none   ANESTHESIA:   general  EBL:  Total I/O In: 1000 [I.V.:1000] Out: -   BLOOD ADMINISTERED:none  DRAINS: (2) Jackson-Pratt drain(s) with closed bulb suction in the prepectoral space   LOCAL MEDICATIONS USED:  NONE  SPECIMEN:  Source of Specimen:  left breast and axillary contents  DISPOSITION OF SPECIMEN:  PATHOLOGY  COUNTS:  YES  TOURNIQUET:  * No tourniquets in log *  DICTATION: .Dragon Dictation After informed consent was obtained the patient to the operating room and placed in the supine position on the operating room table. After adequate induction of general anesthesia the patient's right chest area and neck were prepped with ChloraPrep, allowed to dry, and draped in usual sterile manner. The patient was placed in Trendelenburg position. The area lateral to the bend of the clavicle and the right chest was infiltrated with quarter percent Marcaine. A small incision was made with a 15 blade knife. A large-bore needle from the Port-A-Cath kit was used to slide beneath the bend of the clavicle heading towards the sternal notch and in doing so we were able to access the right subclavian vein without difficulty. A wire was fed through the needle using the Seldinger technique without difficulty. The wire was confirmed in the central venous system using real-time fluoroscopy. Next the incision on the right chest was extended slightly. A subcutaneous pocket was created inferior to the incision by blunt finger dissection and some sharp dissection with the electrocautery. The tubing was then placed on the reservoir and the  reservoir was placed in the pocket. The length of the tubing was estimated again using real-time fluoroscopy. The tubing was cut to the appropriate length. Next the sheath and dilator were placed over the wire using the Seldinger technique without difficulty. The dilator and wire were removed. The tubing was fed through the sheath as far as it would go and held in place while the sheath was gently cracked and separated. Another real-time fluoroscopy imaging showed the tip of the catheter being in the distal superior vena cava. The tubing was then permanently attached to the reservoir. The reservoir was anchored in the pocket with 2 2-0 Prolene stitches. The port was aspirated and it aspirated blood easily. It was then flushed initially with a dilute heparin solution and then with a more concentrated heparin solution. The subcutaneous tissue was closed over the poor with interrupted 0 Vicryl stitches. The skin was then closed with a running 4-0 Monocryl subcuticular stitch. Dermabond dressings were applied. The patient tolerated the procedure well. Drapes were removed and then the left chest and axillary area were prepped with ChloraPrep, allowed to dry, and draped in usual sterile manner. Earlier in the day the patient underwent injection of 1 mCi of technetium sulfur colloid in the subareolar position on the left. An elliptical incision was then made with a 15 blade knife around the nipple and the areola complex. This incision was carried through the skin and subcutaneous tissue sharply with the plasma blade. Skin hooks were then used to elevate the skin flaps anteriorly towards the ceiling. Thin skin flaps were then created circumferentially around the incision between  the breast tissue and subcutaneous fat. This dissection was carried all the way to the chest wall. Laterally when the dissection reached the axilla the axilla was palpated and there were several enlarged lymph nodes. One of these was removed sharply  with the plasma blade. There was some radioactivity this node. Touch preps on this node were positive for breast cancer. Next the breast was removed from the pectoralis muscle the pectoralis fascia. This was also done sharply with the plasma blade. Laterally the axilla was defined by the serratus muscle medially, the latissimus muscle laterally, and the axillary vein superiorly. The fatty contents within the axilla was then dissected free by blunt right angle dissection. Several vessels were controlled with clips. The thoracodorsal and long thoracic nerves were identified and spared. Once the contents of the axilla were removed they were sent separately as left axillary contents. The breast was sent as left breast with a stitch on the lateral aspect. At this point the wound was irrigated with copious amounts of saline. 2 small stab incisions were made near the anterior axillary line inferior to the operative area with a 15 blade knife. A tonsil clamp was placed through each of these openings and used to bring a 19 [place] round Blake drain into the operative bed. The lateral drain was placed in the axilla and the medial drain was curled along the chest wall. The drains were anchored to the skin with a 3-0 nylon stitch. Next the area was examined and found to be hemostatic. The superior and inferior skin flaps were grossly reapproximated with interrupted 3-0 Vicryl stitches. The skin was then closed with a running 4-0 Monocryl subcuticular stitch. Dermabond dressings were applied. The patient tolerated the procedure well. At the end of the case all needle sponge and instrument counts were correct. The patient was then awakened and taken to recovery in stable condition.  PLAN OF CARE: Admit for overnight observation  PATIENT DISPOSITION:  PACU - hemodynamically stable.   Delay start of Pharmacological VTE agent (>24hrs) due to surgical blood loss or risk of bleeding: no

## 2014-09-06 NOTE — Progress Notes (Signed)
Notified Dr Redmond Pulling of low BP 87/49 x2 and 84/46 manually,new  Order received.

## 2014-09-07 MED ORDER — METHOCARBAMOL 500 MG PO TABS
500.0000 mg | ORAL_TABLET | Freq: Three times a day (TID) | ORAL | Status: DC | PRN
  Administered 2014-09-07 – 2014-09-08 (×5): 500 mg via ORAL
  Filled 2014-09-07 (×5): qty 1

## 2014-09-07 MED ORDER — MORPHINE SULFATE 2 MG/ML IJ SOLN: 2.0000 mg | INTRAMUSCULAR | Status: DC | PRN

## 2014-09-07 NOTE — Progress Notes (Signed)
1 Day Post-Op  Subjective: Pain not well controlled  Objective: Vital signs in last 24 hours: Temp:  [97.2 F (36.2 C)-98.1 F (36.7 C)] 97.9 F (36.6 C) (10/24 0552) Pulse Rate:  [60-100] 60 (10/24 0552) Resp:  [12-20] 16 (10/24 0552) BP: (87-104)/(49-66) 91/56 mmHg (10/24 0630) SpO2:  [98 %-100 %] 100 % (10/24 0552) Weight:  [112 lb 6.6 oz (50.99 kg)] 112 lb 6.6 oz (50.99 kg) (10/23 1209) Last BM Date: 09/05/14  Intake/Output from previous day: 10/23 0701 - 10/24 0700 In: 2210 [P.O.:480; I.V.:1730] Out: 937 [Urine:800; Drains:87; Blood:50] Intake/Output this shift:    General appearance: no distress Resp: clear to auscultation bilaterally Cardio: regular rate and rhythm Incision/Wound:clean with viable flaps no hematoma, drains with expected output  Lab Results:   Recent Labs  09/04/14 1532  WBC 7.5  HGB 11.0*  HCT 33.7*  PLT 290   BMET  Recent Labs  09/04/14 1532  NA 140  K 3.7  CL 105  CO2 25  GLUCOSE 103*  BUN 7  CREATININE 0.58  CALCIUM 9.5   PT/INR No results found for this basename: LABPROT, INR,  in the last 72 hours ABG No results found for this basename: PHART, PCO2, PO2, HCO3,  in the last 72 hours  Studies/Results: Nm Sentinel Node Inj-no Rpt (breast)  09/06/2014   CLINICAL DATA: left breast cancer   Sulfur colloid was injected intradermally by the nuclear medicine  technologist for breast cancer sentinel node localization.    Dg Chest Port 1 View  09/06/2014   CLINICAL DATA:  Status post left mastectomy and Port-A-Cath placement  EXAM: PORTABLE CHEST - 1 VIEW  COMPARISON:  None.  FINDINGS: Cardiac shadow is within normal limits. Postsurgical changes are noted on the left. Two surgical drains are seen. A right-sided chest wall port is noted with the catheter tip in the superior right atrium. No pneumothorax is noted. No bony abnormality is seen.  IMPRESSION: Postsurgical change as described.   Electronically Signed   By: Inez Catalina M.D.    On: 09/06/2014 11:32   Dg Fluoro Guide Cv Line-no Report  09/06/2014   CLINICAL DATA:    FLOURO GUIDE CV LINE  Fluoroscopy was utilized by the requesting physician.  No radiographic  interpretation.     Anti-infectives: Anti-infectives   Start     Dose/Rate Route Frequency Ordered Stop   09/06/14 0600  ceFAZolin (ANCEF) IVPB 2 g/50 mL premix     2 g 100 mL/hr over 30 Minutes Intravenous On call to O.R. 09/05/14 1436 09/06/14 0746      Assessment/Plan: POD 1 port/left mrm  1. Will add robaxin today and give some iv pain control, pain not well controlled this am 2. pulm toilet 3. Regular diet 4. Hopefully home tomorrow 5. I spent some time discussing operation with her today and awaiting pathology  [facility] 09/07/2014

## 2014-09-08 MED ORDER — METHOCARBAMOL 500 MG PO TABS: 500.0000 mg | ORAL_TABLET | Freq: Three times a day (TID) | ORAL | Status: DC | PRN

## 2014-09-08 MED ORDER — OXYCODONE-ACETAMINOPHEN 5-325 MG PO TABS: 1.0000 | ORAL_TABLET | ORAL | Status: DC | PRN

## 2014-09-08 NOTE — Progress Notes (Signed)
Pt still having quite a bit of pain, using Robaxin and percocet for pain relief.  Encouraging use of IS, ambulation out in hallway.  Minimal drain output.

## 2014-09-08 NOTE — Discharge Instructions (Signed)
General Anesthesia, Adult, Care After  Refer to this sheet in the next few weeks. These instructions provide you with information on caring for yourself after your procedure. Your health care provider may also give you more specific instructions. Your treatment has been planned according to current medical practices, but problems sometimes occur. Call your health care provider if you have any problems or questions after your procedure.  WHAT TO EXPECT AFTER THE PROCEDURE  After the procedure, it is typical to experience:  Sleepiness.  Nausea and vomiting. HOME CARE INSTRUCTIONS  For the first 24 hours after general anesthesia:  Have a responsible person with you.  Do not drive a car. If you are alone, do not take public transportation.  Do not drink alcohol.  Do not take medicine that has not been prescribed by your health care provider.  Do not sign important papers or make important decisions.  You may resume a normal diet and activities as directed by your health care provider.  Change bandages (dressings) as directed.  If you have questions or problems that seem related to general anesthesia, call the hospital and ask for the anesthetist or anesthesiologist on call. SEEK MEDICAL CARE IF:  You have nausea and vomiting that continue the day after anesthesia.  You develop a rash. SEEK IMMEDIATE MEDICAL CARE IF:  You have difficulty breathing.  You have chest pain.  You have any allergic problems. Document Released: 02/07/2001 Document Revised: 07/04/2013 Document Reviewed: 05/17/2013  [facility] Patient Information 2014 [place], [place].   What to eat:  For your first meals, you should eat lightly; only small meals initially.  If you do not have nausea, you may eat larger meals.  Avoid spicy, greasy and heavy food.    Tissue Adhesive Wound Care    Some cuts and wounds can be closed with tissue adhesive. Adhesive is like glue. It holds the skin together and helps a wound heal faster.  This adhesive goes away on its own as the wound heals.  HOME CARE  Showers are allowed. Do not soak the wound in water. Do not take baths, swim, or use hot tubs. Do not use soaps or creams on your wound.  If a bandage (dressing) was put on, change it as often as told by your doctor.  Keep the bandage dry.  Do not scratch, pick, or rub the adhesive.  Do not put tape over the adhesive. The adhesive could come off.  Protect the wound from another injury.  Protect the wound from sun and tanning beds.  Only take medicine as told by your doctor.  Keep all doctor visits as told. GET HELP RIGHT AWAY IF:  Your wound is red, puffy (swollen), hot, or tender.  You get a rash after the glue is put on.  You have more pain in the wound.  You have a red streak going away from the wound.  You have yellowish-white fluid (pus) coming from the wound.  You have more bleeding.  You have a fever.  You have chills and start to shake.  You notice a bad smell coming from the wound.  Your wound or adhesive breaks open. MAKE SURE YOU:  Understand these instructions.  Will watch your condition.  Will get help right away if you are not doing well or get worse. Document Released: 08/10/2008 Document Revised: 08/22/2013 Document Reviewed: 05/23/2013  [facility] Patient Information 2015 [place], [place]. This information is not intended to replace advice given to you by your health care provider.  Make sure you discuss any questions you have with your health care provider.   [facility] [facility], [place] [id number]  MASTECTOMY: POST OP INSTRUCTIONS  Always review your discharge instruction sheet given to you by the facility where your surgery was performed. IF YOU HAVE DISABILITY OR FAMILY LEAVE FORMS, YOU MUST BRING THEM TO THE OFFICE FOR PROCESSING.   DO NOT GIVE THEM TO YOUR DOCTOR. A prescription for pain medication may be given to you upon discharge.  Take your pain medication as prescribed, if needed.  If  narcotic pain medicine is not needed, then you may take acetaminophen (Tylenol), naprosyn (Alleve) or ibuprofen (Advil) as needed. 1. Take your usually prescribed medications unless otherwise directed. 2. If you need a refill on your pain medication, please contact your pharmacy.  They will contact our office to request authorization.  Prescriptions will not be filled after 5pm or on week-ends. 3. You should follow a light diet the first few days after arrival home, such as soup and crackers, etc.  Resume your normal diet the day after surgery. 4. Most patients will experience some swelling and bruising on the chest and underarm.  Ice packs will help.  Swelling and bruising can take several days to resolve. Wear the binder day and night until you return to the office.  5. It is common to experience some constipation if taking pain medication after surgery.  Increasing fluid intake and taking a stool softener (such as Colace) will usually help or prevent this problem from occurring.  A mild laxative (Milk of Magnesia or Miralax) should be taken according to package instructions if there are no bowel movements after 48 hours. 6. Unless discharge instructions indicate otherwise, leave your bandage dry and in place until your next appointment in 3-5 days.  You may take a limited sponge bath.  No tube baths or showers until the drains are removed.  You may have steri-strips (small skin tapes) in place directly over the incision.  These strips should be left on the skin for 7-10 days. If you have glue it will come off in next couple week.  Any sutures will be removed at an office visit 7. DRAINS:  If you have drains in place, it is important to keep a list of the amount of drainage produced each day in your drains.  Before leaving the hospital, you should be instructed on drain care.  Call our office if you have any questions about your drains. I will remove your drains when they put out less than 30 cc or ml for 2  consecutive days. 8. ACTIVITIES:  You may resume regular (light) daily activities beginning the next day--such as daily self-care, walking, climbing stairs--gradually increasing activities as tolerated.  You may have sexual intercourse when it is comfortable.  Refrain from any heavy lifting or straining until approved by your doctor. a. You may drive when you are no longer taking prescription pain medication, you can comfortably wear a seatbelt, and you can safely maneuver your car and apply brakes. b. RETURN TO WORK:  __________________________________________________________ 9. You should see your doctor in the office for a follow-up appointment approximately 3-5 days after your surgery.  Your doctors nurse will typically make your follow-up appointment when she calls you with your pathology report.  Expect your pathology report 3-4business days after surgery. 10. OTHER INSTRUCTIONS: ______________________________________________________________________________________________ ____________________________________________________________________________________________ WHEN TO CALL YOUR DR Alexy Heldt: 1. Fever over 101.0 2. Nausea and/or vomiting 3. Extreme swelling or bruising 4. Continued bleeding  from incision. 5. Increased pain, redness, or drainage from the incision. The clinic staff is available to answer your questions during regular business hours.  Please dont hesitate to call and ask to speak to one of the nurses for clinical concerns.  If you have a medical emergency, go to the nearest emergency room or call 911.  A surgeon from [facility] is always on call at the hospital. [address] [place], [place], [place]  [address] ? [address], [place], [place]   [address] [phone] ? [phone] ? FAX [phone] Web site: [email or web address]

## 2014-09-08 NOTE — Progress Notes (Signed)
2 Days Post-Op  Subjective: Still very sore, tearful  Objective: Vital signs in last 24 hours: Temp:  [97.6 F (36.4 C)-98.7 F (37.1 C)] 98.7 F (37.1 C) (10/25 0508) Pulse Rate:  [64-66] 65 (10/25 0508) Resp:  [15-16] 16 (10/25 0508) BP: (83-92)/(49-52) 83/49 mmHg (10/25 0508) SpO2:  [99 %-100 %] 99 % (10/25 0508) Last BM Date: 09/05/14  Intake/Output from previous day: 10/24 0701 - 10/25 0700 In: 2018.3 [P.O.:360; I.V.:1658.3] Out: 938 [Urine:900; Drains:38] Intake/Output this shift: Total I/O In: 240 [P.O.:240] Out: 175 [Urine:175]  Incision/Wound:drains with minimal serous output, flaps viable, no hematoma  Lab Results:  No results found for this basename: WBC, HGB, HCT, PLT,  in the last 72 hours BMET No results found for this basename: NA, K, CL, CO2, GLUCOSE, BUN, CREATININE, CALCIUM,  in the last 72 hours PT/INR No results found for this basename: LABPROT, INR,  in the last 72 hours ABG No results found for this basename: PHART, PCO2, PO2, HCO3,  in the last 72 hours  Studies/Results: Dg Chest Port 1 View  09/06/2014   CLINICAL DATA:  Status post left mastectomy and Port-A-Cath placement  EXAM: PORTABLE CHEST - 1 VIEW  COMPARISON:  None.  FINDINGS: Cardiac shadow is within normal limits. Postsurgical changes are noted on the left. Two surgical drains are seen. A right-sided chest wall port is noted with the catheter tip in the superior right atrium. No pneumothorax is noted. No bony abnormality is seen.  IMPRESSION: Postsurgical change as described.   Electronically Signed   By: Inez Catalina M.D.   On: 09/06/2014 11:32    Anti-infectives: Anti-infectives   Start     Dose/Rate Route Frequency Ordered Stop   09/06/14 0600  ceFAZolin (ANCEF) IVPB 2 g/50 mL premix     2 g 100 mL/hr over 30 Minutes Intravenous On call to O.R. 09/05/14 1436 09/06/14 0746      Assessment/Plan: POD 2 left mrm/port  1. Will see how she does, still having pain, if does well  through am please call and can discharge today   [facility] 09/08/2014

## 2014-09-08 NOTE — Progress Notes (Signed)
Pt given breast bag with JP measuring containers and chart to record drainage.  Also, given pamphlet about [employer] support group.  Reviewed arm restrictions for the left arm/prevention of lymphedema.

## 2014-09-09 ENCOUNTER — Telehealth: Payer: Self-pay | Admitting: Hematology and Oncology

## 2014-09-09 ENCOUNTER — Telehealth: Payer: Self-pay | Admitting: *Deleted

## 2014-09-09 ENCOUNTER — Ambulatory Visit (HOSPITAL_COMMUNITY): Payer: No Typology Code available for payment source

## 2014-09-09 ENCOUNTER — Ambulatory Visit (HOSPITAL_COMMUNITY): Payer: Self-pay

## 2014-09-09 MED ORDER — KETOROLAC TROMETHAMINE 30 MG/ML IJ SOLN: 30.0000 mg | Freq: Three times a day (TID) | INTRAMUSCULAR | Status: DC

## 2014-09-09 MED ORDER — OXYCODONE-ACETAMINOPHEN 5-325 MG PO TABS: 1.0000 | ORAL_TABLET | ORAL | Status: DC | PRN

## 2014-09-09 MED ORDER — KETOROLAC TROMETHAMINE 30 MG/ML IJ SOLN
30.0000 mg | Freq: Four times a day (QID) | INTRAMUSCULAR | Status: DC
  Administered 2014-09-09 (×2): 30 mg via INTRAVENOUS
  Filled 2014-09-09 (×3): qty 1

## 2014-09-09 NOTE — Discharge Summary (Signed)
Physician Discharge Summary  Patient ID: [name] MRN: [id number] DOB/AGE: March 01, 1986 28 y.o.  Admit date: 09/06/2014 Discharge date: 09/09/2014  Admission Diagnoses:  Discharge Diagnoses:  Active Problems:   Breast cancer, female   Discharged Condition: good  Hospital Course: The pt underwent left modified radical mastectomy and port placement. Postop she had significant issues over the weekend with pain control. She wanted to go home yesterday but developed a headache. She is going to try to go home later today  Consults: None  Significant Diagnostic Studies: none  Treatments: surgery: as above  Discharge Exam: Blood pressure 96/66, pulse 76, temperature 98.5 F (36.9 C), temperature source Oral, resp. rate 18, height 5' (1.524 m), weight 112 lb 6.6 oz (50.99 kg), SpO2 100.00%, not currently breastfeeding. Chest wall: skin flaps look good  Disposition: 01-Home or Self Care  Discharge Instructions   Call MD for:  difficulty breathing, headache or visual disturbances    Complete by:  As directed      Call MD for:  extreme fatigue    Complete by:  As directed      Call MD for:  hives    Complete by:  As directed      Call MD for:  persistant dizziness or light-headedness    Complete by:  As directed      Call MD for:  persistant nausea and vomiting    Complete by:  As directed      Call MD for:  redness, tenderness, or signs of infection (pain, swelling, redness, odor or green/yellow discharge around incision site)    Complete by:  As directed      Call MD for:  severe uncontrolled pain    Complete by:  As directed      Call MD for:  temperature >100.4    Complete by:  As directed      Diet - low sodium heart healthy    Complete by:  As directed      Discharge instructions    Complete by:  As directed   Sponge bathe while drains are in. No overhead lifting. Empty drains, record output, and recharge bulb twice a day     Increase activity slowly     Complete by:  As directed      No wound care    Complete by:  As directed             Medication List         methocarbamol 500 MG tablet  Commonly known as:  ROBAXIN  Take 1 tablet (500 mg total) by mouth every 8 (eight) hours as needed for muscle spasms.     multivitamin with minerals Tabs tablet  Take 1 tablet by mouth daily.     oxyCODONE-acetaminophen 5-325 MG per tablet  Commonly known as:  PERCOCET/ROXICET  Take 1-2 tablets by mouth every 4 (four) hours as needed for moderate pain.     oxyCODONE-acetaminophen 5-325 MG per tablet  Commonly known as:  ROXICET  Take 1-2 tablets by mouth every 4 (four) hours as needed.           Follow-up Information   Call Merrie Roof, MD.   Specialty:  General Surgery   Contact information:   [place] [address] [phone]       Follow up with Merrie Roof, MD In 1 week.   Specialty:  General Surgery   Contact information:   [address] [place] [address] [phone]       Signed: Merrie Roof 09/09/2014, 6:32 AM

## 2014-09-09 NOTE — Telephone Encounter (Signed)
, °

## 2014-09-09 NOTE — Progress Notes (Signed)
3 Days Post-Op  Subjective: Still has a bad headache but wants to try to go home later today if it will ease up  Objective: Vital signs in last 24 hours: Temp:  [98.4 F (36.9 C)-98.6 F (37 C)] 98.5 F (36.9 C) (10/26 0605) Pulse Rate:  [66-76] 76 (10/26 0605) Resp:  [16-18] 18 (10/26 0605) BP: (84-97)/(53-66) 96/66 mmHg (10/26 0605) SpO2:  [96 %-100 %] 100 % (10/26 0605) Last BM Date: 09/05/14  Intake/Output from previous day: 10/25 0701 - 10/26 0700 In: 63 [P.O.:820] Out: 960 [Urine:900; Drains:60] Intake/Output this shift: Total I/O In: -  Out: 30 [Drains:30]  Resp: clear to auscultation bilaterally Chest wall: skin flaps look good Cardio: regular rate and rhythm GI: soft, non-tender; bowel sounds normal; no masses,  no organomegaly  Lab Results:  No results found for this basename: WBC, HGB, HCT, PLT,  in the last 72 hours BMET No results found for this basename: NA, K, CL, CO2, GLUCOSE, BUN, CREATININE, CALCIUM,  in the last 72 hours PT/INR No results found for this basename: LABPROT, INR,  in the last 72 hours ABG No results found for this basename: PHART, PCO2, PO2, HCO3,  in the last 72 hours  Studies/Results: No results found.  Anti-infectives: Anti-infectives   Start     Dose/Rate Route Frequency Ordered Stop   09/06/14 0600  ceFAZolin (ANCEF) IVPB 2 g/50 mL premix     2 g 100 mL/hr over 30 Minutes Intravenous On call to O.R. 09/05/14 1436 09/06/14 0746      Assessment/Plan: s/p Procedure(s): INSERTION PORT-A-CATH (Right) LEFT MASTECTOMY WITH SENTINEL LYMPH NODE BIOPSY/NODE MAPPING (Left) will add toradol for pain control Hopefully home later today  LOS: 3 days    TOTH III,Yordi Krager S 09/09/2014

## 2014-09-09 NOTE — Progress Notes (Signed)
D/c to home. Breathing regular and unlabored on room air. D/c instructions given and understood.  Pain med given before d/c. VSS.

## 2014-09-09 NOTE — Telephone Encounter (Signed)
Per staff message and POF I have scheduled appts. Advised scheduler of appts. Advised scheduler that patient needs to be in infusion by 1130 on 11/3 or start treramentr on 11/4. JMW

## 2014-09-10 ENCOUNTER — Encounter (HOSPITAL_COMMUNITY): Payer: Self-pay | Admitting: General Surgery

## 2014-09-10 ENCOUNTER — Telehealth: Payer: Self-pay | Admitting: *Deleted

## 2014-09-10 ENCOUNTER — Encounter: Payer: Self-pay | Admitting: Hematology and Oncology

## 2014-09-10 NOTE — Progress Notes (Signed)
Left vm for pt regarding applying for the [employer].  Let her know I need proof of income from her and her spouse.  I noted that I need to see pt before her visit tomorrow to follow up.

## 2014-09-10 NOTE — Telephone Encounter (Signed)
Per staff message and POF I have scheduled appts. Advised scheduler of appts. JMW  

## 2014-09-11 ENCOUNTER — Encounter: Payer: Self-pay | Admitting: *Deleted

## 2014-09-11 ENCOUNTER — Telehealth: Payer: Self-pay | Admitting: Hematology and Oncology

## 2014-09-11 ENCOUNTER — Other Ambulatory Visit: Payer: Self-pay | Admitting: *Deleted

## 2014-09-11 ENCOUNTER — Other Ambulatory Visit: Payer: Self-pay | Admitting: Hematology and Oncology

## 2014-09-11 ENCOUNTER — Other Ambulatory Visit: Payer: No Typology Code available for payment source

## 2014-09-11 ENCOUNTER — Ambulatory Visit (HOSPITAL_COMMUNITY)
Admission: RE | Admit: 2014-09-11 | Discharge: 2014-09-11 | Disposition: A | Discharge status: Home / Self Care | Payer: No Typology Code available for payment source | Source: Ambulatory Visit | Attending: Hematology and Oncology | Admitting: Hematology and Oncology

## 2014-09-11 DIAGNOSIS — Z01818 Encounter for other preprocedural examination: Secondary | ICD-10-CM | POA: Insufficient documentation

## 2014-09-11 DIAGNOSIS — C50919 Malignant neoplasm of unspecified site of unspecified female breast: Secondary | ICD-10-CM

## 2014-09-11 DIAGNOSIS — C50512 Malignant neoplasm of lower-outer quadrant of left female breast: Secondary | ICD-10-CM

## 2014-09-11 NOTE — Progress Notes (Signed)
Patient was scheduled for first chemo for 11/3, drains will still be in.  Per Dr. Lindi Adie, patient should keep app't to see him on 11/3 but she will not get treatment.  Discussed this with patient, husband via interpreter.

## 2014-09-11 NOTE — Progress Notes (Signed)
Echocardiogram 2D Echocardiogram limited has been performed.  [name] 09/11/2014, 11:04 AM

## 2014-09-11 NOTE — Telephone Encounter (Signed)
s.w. pt and advised on appt change...lvm for Beverlee Nims pt wanted to know why it is being changed.

## 2014-09-12 ENCOUNTER — Encounter: Payer: Self-pay | Admitting: Hematology and Oncology

## 2014-09-12 NOTE — Progress Notes (Signed)
Pt is approved for the $1000 [employer] grant.  

## 2014-09-16 ENCOUNTER — Encounter (HOSPITAL_COMMUNITY): Payer: Self-pay | Admitting: General Surgery

## 2014-09-17 ENCOUNTER — Ambulatory Visit: Payer: No Typology Code available for payment source | Admitting: Hematology and Oncology

## 2014-09-17 ENCOUNTER — Other Ambulatory Visit: Payer: No Typology Code available for payment source

## 2014-09-17 ENCOUNTER — Ambulatory Visit: Payer: No Typology Code available for payment source

## 2014-09-18 ENCOUNTER — Encounter: Payer: Self-pay | Admitting: Genetic Counselor

## 2014-09-18 ENCOUNTER — Ambulatory Visit: Payer: No Typology Code available for payment source

## 2014-09-18 NOTE — Progress Notes (Signed)
GENETIC TEST RESULTS  Patient Name: [name] Patient Age: 28 y.o. Encounter Date: 09/18/2014  Referring Physician: Nicholas Lose, MD   Ms. Cook was called today with the help of a [facility] interpreter, Cezar, to discuss genetic test results. Please see the Genetics note from her visit on 08/02/14 for a detailed discussion of her personal and family history.  GENETIC TESTING: At the time of Ms. Cook's visit, we recommended she pursue genetic testing of multiple genes on the BreastNext gene panel. She was initially called with negative results of BRCAPlus on 08/19/14. The BreastNext panel, which included sequencing and deletion/duplication analysis of 17 genes, was performed at [facility]. Testing was normal and did not reveal any clearly harmful mutation in these genes. The genes tested were ATM, BARD1, BRCA1, BRCA2, BRIP1, CDH1, CHEK2, MRE11A, MUTYH, NBN, NF1, PALB2, PTEN, RAD50, RAD51C, RAD51D, and TP53.  We discussed with Ms. Cook that since the current test is not perfect, it is possible there may be a gene mutation that current testing cannot detect, but that chance is small. We also discussed that it is possible that a different genetic factor, which was not part of this testing or has not yet been discovered, is responsible for the cancer diagnoses in the family. Should Ms. Cook wish to discuss or pursue this additional testing, we are happy to coordinate this at any time, but do not feel that she is at significant risk of harboring a mutation in a different gene that is currently available for clinical testing.     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 At this time, it is unknown if this variant is associated with increased cancer risk or if this is a normal finding, but most variants such as this get reclassified to being inconsequential. It should not be  used to make medical management decisions. With time, we suspect the lab will determine the significance of this variant, if any. If we do learn more about it, we will try to contact Ms. Cook to discuss it further. However, it is important to stay in touch with [place] periodically and keep the address and phone number up to date.  CANCER SCREENING: Given Ms. Cook's very young age at diagnosis, we must interpret these negative results with some caution. Families with features suggestive of hereditary risk tend to have multiple family members with cancer, diagnoses in multiple generations and diagnoses before the age of 59. Other than her own young age, Ms. Cook's family does not exhibit most of these features, but she has no information regarding her paternal family. It is possible there is a different gene, that we have not yet discovered, that may be responsible for the cancer in this family.   FAMILY MEMBERS: Women in the family are at increased risk of developing breast cancer, over the general population risk, simply due to the family history. We recommended they meet with their own physicians to discuss the optimum age to initiate breast screening.   Lastly, we discussed with Ms. Cook that cancer genetics is a rapidly advancing field and it is possible that new genetic tests will be appropriate for her in the future. We encouraged her to remain in contact with [place] on an annual basis so we can update her personal and family histories, and let her know of advances in cancer genetics that may benefit the family. Our contact number was provided. Ms. Cook's questions were answered to her satisfaction  today, and she knows she is welcome to call anytime with additional questions.    Steele Berg, MS, [facility] Certified Genetic Counselor phone: [phone] [email or web address]

## 2014-09-25 ENCOUNTER — Other Ambulatory Visit: Payer: Self-pay | Admitting: Hematology and Oncology

## 2014-09-25 DIAGNOSIS — C50512 Malignant neoplasm of lower-outer quadrant of left female breast: Secondary | ICD-10-CM

## 2014-09-25 MED ORDER — DEXAMETHASONE 4 MG PO TABS: 8.0000 mg | ORAL_TABLET | Freq: Two times a day (BID) | ORAL | Status: DC

## 2014-09-25 MED ORDER — ONDANSETRON HCL 8 MG PO TABS: 8.0000 mg | ORAL_TABLET | Freq: Two times a day (BID) | ORAL | Status: DC

## 2014-09-25 MED ORDER — LIDOCAINE-PRILOCAINE 2.5-2.5 % EX CREA: TOPICAL_CREAM | CUTANEOUS | Status: DC

## 2014-09-25 MED ORDER — LORAZEPAM 0.5 MG PO TABS: 0.5000 mg | ORAL_TABLET | Freq: Four times a day (QID) | ORAL | Status: DC | PRN

## 2014-09-25 MED ORDER — PROCHLORPERAZINE MALEATE 10 MG PO TABS: 10.0000 mg | ORAL_TABLET | Freq: Four times a day (QID) | ORAL | Status: DC | PRN

## 2014-09-30 ENCOUNTER — Ambulatory Visit: Payer: No Typology Code available for payment source | Attending: General Surgery | Admitting: Physical Therapy

## 2014-09-30 ENCOUNTER — Encounter: Payer: Self-pay | Admitting: Physical Therapy

## 2014-09-30 DIAGNOSIS — Z9012 Acquired absence of left breast and nipple: Secondary | ICD-10-CM | POA: Insufficient documentation

## 2014-09-30 DIAGNOSIS — Z5189 Encounter for other specified aftercare: Secondary | ICD-10-CM | POA: Insufficient documentation

## 2014-09-30 DIAGNOSIS — C50512 Malignant neoplasm of lower-outer quadrant of left female breast: Secondary | ICD-10-CM | POA: Insufficient documentation

## 2014-09-30 DIAGNOSIS — M25612 Stiffness of left shoulder, not elsewhere classified: Secondary | ICD-10-CM | POA: Insufficient documentation

## 2014-09-30 DIAGNOSIS — M25512 Pain in left shoulder: Secondary | ICD-10-CM | POA: Insufficient documentation

## 2014-10-01 ENCOUNTER — Other Ambulatory Visit: Payer: Self-pay | Admitting: Hematology and Oncology

## 2014-10-01 NOTE — Therapy (Signed)
Physical Therapy Evaluation  Patient Details  Name: [name] MRN: [id number] Date of Birth: [birth year]  Encounter Date: 09/30/2014      PT End of Session - 10/01/14 0924    Visit Number 1   Number of Visits 9   Date for PT Re-Evaluation 10/30/14   PT Start Time 1440   PT Stop Time 1519   PT Time Calculation (min) 39 min      Past Medical History  Diagnosis Date  . Cancer     left breast  . Malignant neoplasm of breast (female), unspecified site   . Anemia     Past Surgical History  Procedure Laterality Date  . Cesarean section    . Left breast biopsy    . Breast lumpectomy Left 07/10/2014    Procedure: LEFT BREAST LUMPECTOMY;  Surgeon: Merrie Roof, MD;  Location: [place];  Service: General;  Laterality: Left;  . Portacath placement Right 09/06/2014    Procedure: INSERTION PORT-A-CATH;  Surgeon: Autumn Messing III, MD;  Location: [place];  Service: General;  Laterality: Right;  . Mastectomy w/ sentinel node biopsy Left 09/06/2014    Procedure: LEFT MASTECTOMY WITH SENTINEL LYMPH NODE BIOPSY/NODE MAPPING;  Surgeon: Autumn Messing III, MD;  Location: [place];  Service: General;  Laterality: Left;    LMP 08/06/2014  Visit Diagnosis:  Stiffness of joint, shoulder region, left - Plan: PT plan of care cert/re-cert  Pain in shoulder region, left - Plan: PT plan of care cert/re-cert      Subjective Assessment - 09/30/14 1446    Symptoms "I had a mastectomy and I was told not to raise my hand above the level of my head."  "Now that it doesn't hurt that bad, I can't raise it."   Pertinent History Mastectomy 09/06/14 on left.   Currently in Pain? Yes   Pain Score 5   up to 10 when she raises her arm   Pain Location Arm   Pain Orientation Left   Pain Descriptors / Indicators Tingling   Pain Onset 1 to 4 weeks ago   Aggravating Factors  moving the arm or something rubbing on it   Pain Relieving Factors ibuprofen                     Plan - 10/01/14 0925    Clinical Impression Statement Pt. with significantly limited ROM of left shoulder s/p mastectomy would benefit from therapy to improve function.   Pt will benefit from skilled therapeutic intervention in order to improve on the following deficits Decreased range of motion;Pain   Rehab Potential Excellent   PT Frequency 2x / week   PT Duration 4 weeks   PT Treatment/Interventions Manual techniques;Therapeutic exercise;ADLs/Self Care Home Management;Patient/family education   PT Next Visit Plan Begin ROM of left shoulder and instruct in HEP.   PT Home Exercise Plan HEP for shoulder ROM to be instructed.   Consulted and Agree with Plan of Care Patient;Other (Comment)  Note that an interpreter was present for entire evaluation.        Problem List Patient Active Problem List   Diagnosis Date Noted  . Breast cancer of lower-outer quadrant of left female breast 07/26/2014            LYMPHEDEMA/ONCOLOGY QUESTIONNAIRE - 09/30/14 1503    Right Upper Extremity Lymphedema   10 cm Proximal to Olecranon Process 24.9 cm   Olecranon Process 22 cm   10 cm Proximal  to Ulnar Styloid Process 21.1 cm   Just Proximal to Ulnar Styloid Process 14.3 cm   Across Hand at [employer] 17.5 cm   At [place] of 2nd Digit 5.9 cm   Left Upper Extremity Lymphedema   10 cm Proximal to Olecranon Process 24.7 cm   Olecranon Process 22.1 cm   10 cm Proximal to Ulnar Styloid Process 20.4 cm   Just Proximal to Ulnar Styloid Process 14.1 cm   Across Hand at [employer] 17.2 cm   At [place] of 2nd Digit 5.8 cm                          Quick Dash - 10/01/14 0001    Open a tight or new jar Moderate difficulty   Do heavy household chores (wash walls, wash floors) Unable   Carry a shopping bag or briefcase Moderate difficulty   Wash your back No difficulty  uses other arm (right)   Use a knife to cut food No difficulty  uses other arm (right)   Recreational  activities in which you take some force or impact through your arm, shoulder, or hand (golf, hammering, tennis) Unable   During the past week, to what extent has your arm, shoulder or hand problem interfered with your normal social activities with family, friends, neighbors, or groups? Extremely   During the past week, to what extent has your arm, shoulder or hand problem limited your work or other regular daily activities Quite a bit   Arm, shoulder, or hand pain. Extreme   Tingling (pins and needles) in your arm, shoulder, or hand Extreme   Difficulty Sleeping Severe difficulty   DASH Score 68.18 %                      Long Term Clinic Goals - 10/01/14 0931    CC Long Term Goal  #1   Title Active left shoulder flexion at least 140 degrees for improved overhead reach.   Baseline 76 degrees   Time 4   Period Weeks   Status New   CC Long Term Goal  #2   Title Active left shoulder abduction at least 140 degrees for improved ADLs.   Baseline 63 degrees   Time 4   Period Weeks   Status New   CC Long Term Goal  #3   Title Independent with home exercise program.   Baseline No knowledge   Time 4   Period Weeks   CC Long Term Goal  #4   Title Report pain decrease to 3/10 or less.   Baseline 5/10 at rest to 10/10 with movement of left arm.   Time 4   Period Weeks   Status New          [name],[name], PT 10/01/2014, 9:38 AM

## 2014-10-02 ENCOUNTER — Ambulatory Visit (HOSPITAL_BASED_OUTPATIENT_CLINIC_OR_DEPARTMENT_OTHER): Payer: Self-pay | Admitting: Hematology and Oncology

## 2014-10-02 ENCOUNTER — Other Ambulatory Visit (HOSPITAL_BASED_OUTPATIENT_CLINIC_OR_DEPARTMENT_OTHER): Payer: Self-pay

## 2014-10-02 ENCOUNTER — Encounter: Payer: Self-pay | Admitting: Hematology and Oncology

## 2014-10-02 DIAGNOSIS — C50512 Malignant neoplasm of lower-outer quadrant of left female breast: Secondary | ICD-10-CM

## 2014-10-02 LAB — CBC WITH DIFFERENTIAL/PLATELET
BASO%: 1.5 % (ref 0.0–2.0)
BASOS ABS: 0.1 10*3/uL (ref 0.0–0.1)
EOS%: 3.1 % (ref 0.0–7.0)
Eosinophils Absolute: 0.3 10*3/uL (ref 0.0–0.5)
HCT: 33.6 % — ABNORMAL LOW (ref 34.8–46.6)
HEMOGLOBIN: 11 g/dL — AB (ref 11.6–15.9)
LYMPH%: 37 % (ref 14.0–49.7)
MCH: 20.4 pg — AB (ref 25.1–34.0)
MCHC: 32.7 g/dL (ref 31.5–36.0)
MCV: 62.3 fL — AB (ref 79.5–101.0)
MONO#: 0.5 10*3/uL (ref 0.1–0.9)
MONO%: 5.5 % (ref 0.0–14.0)
NEUT#: 4.3 10*3/uL (ref 1.5–6.5)
NEUT%: 52.9 % (ref 38.4–76.8)
Platelets: 288 10*3/uL (ref 145–400)
RBC: 5.39 10*6/uL (ref 3.70–5.45)
RDW: 15.4 % — ABNORMAL HIGH (ref 11.2–14.5)
WBC: 8.2 10*3/uL (ref 3.9–10.3)
lymph#: 3 10*3/uL (ref 0.9–3.3)
nRBC: 0 % (ref 0–0)

## 2014-10-02 LAB — COMPREHENSIVE METABOLIC PANEL (CC13)
ALT: 8 U/L (ref 0–55)
AST: 13 U/L (ref 5–34)
Albumin: 4.2 g/dL (ref 3.5–5.0)
Alkaline Phosphatase: 81 U/L (ref 40–150)
Anion Gap: 7 mEq/L (ref 3–11)
BILIRUBIN TOTAL: 0.4 mg/dL (ref 0.20–1.20)
BUN: 7.3 mg/dL (ref 7.0–26.0)
CO2: 25 mEq/L (ref 22–29)
CREATININE: 0.6 mg/dL (ref 0.6–1.1)
Calcium: 9 mg/dL (ref 8.4–10.4)
Chloride: 106 mEq/L (ref 98–109)
GLUCOSE: 98 mg/dL (ref 70–140)
Potassium: 3.8 mEq/L (ref 3.5–5.1)
Sodium: 139 mEq/L (ref 136–145)
Total Protein: 6.9 g/dL (ref 6.4–8.3)

## 2014-10-02 NOTE — Progress Notes (Signed)
Patient Care Team: No Pcp Per Patient as PCP - General (General Practice)  DIAGNOSIS: Breast cancer of lower-outer quadrant of left female breast   Staging form: Breast, AJCC 7th Edition     Clinical: No stage assigned - Unsigned     Pathologic: Stage IIA (T2, N3a, cM0) - Signed by Rulon Eisenmenger, MD on 09/11/2014   SUMMARY OF ONCOLOGIC HISTORY:   Breast cancer of lower-outer quadrant of left female breast   06/06/2014 Imaging Palpable Left breast mass at 12:00 position 1.9 cm, initial biopsy 06/12/2014 revealed fibrocystic changes.   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 The genes tested were ATM, BARD1, BRCA1, BRCA2, BRIP1, CDH1, CHEK2, MRE11A, MUTYH, NBN, NF1, PALB2, PTEN, RAD50, RAD51C, RAD51D, and TP53.    CHIEF COMPLIANT: Followup to discuss mastectomy results  INTERVAL HISTORY: [name] is a 28 year old Hispanic lady with above-mentioned history of HER-2 positive breast cancer who underwent left mastectomy. Final pathology revealed multifocal invasive adenocarcinoma with lymphovascular invasion. 18/21 lymph nodes were positive with extracapsular extension. She is here today to discuss the pathology report as well as to discuss adjuvant treatment options. She has recovered very well from surgery and is without any major discomfort. She has a problem with range of motion on the right thumb she cannot lift the arm or the shoulder.  REVIEW OF SYSTEMS:    Constitutional: Denies fevers, chills or abnormal weight loss Eyes: Denies blurriness of vision Ears, nose, mouth, throat, and face: Denies mucositis or sore throat Respiratory: Denies cough, dyspnea or wheezes Cardiovascular: Denies palpitation, chest discomfort or lower extremity swelling Gastrointestinal:  Denies nausea, heartburn or change in bowel habits Skin: Denies abnormal skin rashes Lymphatics: Denies new lymphadenopathy or easy bruising Neurological:Denies numbness, tingling or new weaknesses Behavioral/Psych: Mood is stable, no new changes  Breast: soreness from recent surgery All other systems were reviewed with the patient and are negative.  I have reviewed the past medical history, past surgical history, social history and family history with the patient and they are unchanged from previous note.  ALLERGIES:  has No Known Allergies.  MEDICATIONS:  Current Outpatient Prescriptions  Medication Sig Dispense Refill  . dexamethasone (DECADRON) 4 MG tablet Take 2 tablets (8 mg total) by mouth 2 (two) times daily. Start the day before Taxotere. Then again the day after chemo for 3 days. 30 tablet 1  . ibuprofen (ADVIL,MOTRIN) 600 MG tablet Take 600 mg by mouth every 6 (six) hours as needed.    . lidocaine-prilocaine (EMLA) cream Apply to affected area once 30 g 3  . lidocaine-prilocaine (EMLA) cream Apply to affected area once 30 g 3  . LORazepam (ATIVAN) 0.5 MG tablet Take 1 tablet (0.5 mg total) by mouth every 6 (six) hours as needed (Nausea or vomiting). 30 tablet 0  . methocarbamol (ROBAXIN) 500 MG tablet Take 1 tablet (500 mg total) by mouth every 8 (eight) hours as needed for muscle spasms. 20 tablet 0  . Multiple Vitamin (MULTIVITAMIN WITH MINERALS) TABS tablet Take 1 tablet by mouth daily.    . ondansetron (  ZOFRAN) 8 MG tablet Take 1 tablet (8 mg total) by mouth 2 (two) times daily. Start the day after chemo for 3 days. Then take as needed for nausea or vomiting. 30  tablet 1  . oxyCODONE-acetaminophen (PERCOCET/ROXICET) 5-325 MG per tablet Take 1-2 tablets by mouth every 4 (four) hours as needed for moderate pain. 30 tablet 0  . oxyCODONE-acetaminophen (ROXICET) 5-325 MG per tablet Take 1-2 tablets by mouth every 4 (four) hours as needed. 50 tablet 0  . prochlorperazine (COMPAZINE) 10 MG tablet Take 1 tablet (10 mg total) by mouth every 6 (six) hours as needed (Nausea or vomiting). 30 tablet 1   No current facility-administered medications for this visit.    PHYSICAL EXAMINATION: ECOG PERFORMANCE STATUS: 1 - Symptomatic but completely ambulatory  Filed Vitals:   10/02/14 1417  BP: 112/70  Pulse: 78  Temp: 97.8 F (36.6 C)  Resp: 18   Filed Weights   10/02/14 1417  Weight: 110 lb 3.2 oz (49.986 kg)    GENERAL:alert, no distress and comfortable SKIN: skin color, texture, turgor are normal, no rashes or significant lesions EYES: normal, Conjunctiva are pink and non-injected, sclera clear OROPHARYNX:no exudate, no erythema and lips, buccal mucosa, and tongue normal  NECK: supple, thyroid normal size, non-tender, without nodularity LYMPH:  no palpable lymphadenopathy in the cervical, axillary or inguinal LUNGS: clear to auscultation and percussion with normal breathing effort HEART: regular rate & rhythm and no murmurs and no lower extremity edema ABDOMEN:abdomen soft, non-tender and normal bowel sounds Musculoskeletal:no cyanosis of digits and no clubbing  NEURO: alert & oriented x 3 with fluent speech, no focal motor/sensory deficits  LABORATORY DATA:  I have reviewed the data as listed   Chemistry      Component Value Date/Time   NA 139 10/02/2014 1404   NA 140 09/04/2014 1532   K 3.8 10/02/2014 1404   K 3.7 09/04/2014 1532   CL 105 09/04/2014 1532   CO2 25 10/02/2014 1404   CO2 25 09/04/2014 1532   BUN 7.3 10/02/2014 1404   BUN 7 09/04/2014 1532   CREATININE 0.6 10/02/2014 1404   CREATININE 0.58 09/04/2014 1532      Component  Value Date/Time   CALCIUM 9.0 10/02/2014 1404   CALCIUM 9.5 09/04/2014 1532   ALKPHOS 81 10/02/2014 1404   AST 13 10/02/2014 1404   ALT 8 10/02/2014 1404   BILITOT 0.40 10/02/2014 1404       Lab Results  Component Value Date   WBC 8.2 10/02/2014   HGB 11.0* 10/02/2014   HCT 33.6* 10/02/2014   MCV 62.3* 10/02/2014   PLT 288 10/02/2014   NEUTROABS 4.3 10/02/2014     RADIOGRAPHIC STUDIES: I have personally reviewed the radiology reports and agreed with their findings. No results found.   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 Positive for lymphovascular invasion, extracapsular tumor extension grade 3 ER 53%, PR 30%, HER-2 positive ratio 5.95, Ki-67 33%, T2, N3, M0 stage IIIc with high-grade DCIS status post left mastectomy 09/06/2014  Pathology review: I discussed with her the details of pathology including the multifocal nature of her disease as well as extensive lymph node involvement. I believe her disease is very high risk. I would like to obtain a PET/CT scan for further evaluation and it reviewed to look at distant metastatic  disease.  Chemotherapy counseling: I discussed with her adjuvant systemic chemotherapy with Taxotere, carboplatin,Herceptin,, Perjeta once every 3 weeks for 6 cycles followed by Herceptin maintenance for one year. Her echocardiogram showed an ejection fraction of 55-60%. I recommended a three-month echocardiograms.I have discussed the risks and benefits of chemotherapy including the risks of nausea/ vomiting, risk of infection from low WBC count, fatigue due to chemo or anemia, bruising or bleeding due to low platelets, mouth sores, loss/ change in taste and decreased appetite. Liver and kidney function will be monitored through out chemotherapy as abnormalities in liver and  kidney function may be a side effect of treatment. Cardiac dysfunction due to Herceptin and Perjeta were discussed in detail. Risk of permanent bone marrow dysfunction due to chemo were also discussed.  I instructed the patient to buy Imodium as well as Claritin-D over-the-counter for prevention the last of related bone pain. Return to clinic one week after starting chemotherapy for toxicity check and follow up with the nurse practitioner.    Orders Placed This Encounter  Procedures  . NM PET Image Initial (PI) Skull Base To Thigh    Standing Status: Future     Number of Occurrences:      Standing Expiration Date: 10/02/2015    Order Specific Question:  Reason for Exam (SYMPTOM  OR DIAGNOSIS REQUIRED)    Answer:  Stage IIIc breast cancer initial staging    Order Specific Question:  Is the patient pregnant?    Answer:  No    Order Specific Question:  Preferred imaging location?    Answer:  [facility]   The patient has a good understanding of the overall plan. she agrees with it. She will call with any problems that may develop before her next visit here.    Rulon Eisenmenger, MD 10/02/2014 3:53 PM

## 2014-10-02 NOTE — Progress Notes (Signed)
Note created by Dr. Gudena during office visit. Copy to patient, original to scan. 

## 2014-10-02 NOTE — Assessment & Plan Note (Signed)
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 Positive for lymphovascular invasion, extracapsular tumor extension grade 3 ER 53%, PR 30%, HER-2 positive ratio 5.95, Ki-67 33%, T2, N3, M0 stage IIIc with high-grade DCIS status post left mastectomy 09/06/2014  Pathology review: I discussed with her the details of pathology including the multifocal nature of her disease as well as extensive lymph node involvement. I believe her disease is very high risk. I would like to obtain a PET/CT scan for further evaluation and it reviewed to look at distant metastatic disease.  Chemotherapy counseling: I discussed with her adjuvant systemic chemotherapy with Taxotere, carboplatin,Herceptin,, Perjeta once every 3 weeks for 6 cycles followed by Herceptin maintenance for one year. Her echocardiogram showed an ejection fraction of 55-60%. I recommended a three-month echocardiograms.I have discussed the risks and benefits of chemotherapy including the risks of nausea/ vomiting, risk of infection from low WBC count, fatigue due to chemo or anemia, bruising or bleeding due to low platelets, mouth sores, loss/ change in taste and decreased appetite. Liver and kidney function will be monitored through out chemotherapy as abnormalities in liver and kidney function may be a side effect of treatment. Cardiac dysfunction due to Herceptin and Perjeta were discussed in detail. Risk of permanent bone marrow dysfunction due to chemo were also discussed.  I instructed the patient to buy Imodium as well as Claritin-D over-the-counter for prevention the last of related bone pain. Return to clinic one week after starting chemotherapy for toxicity check and follow up with the nurse practitioner.

## 2014-10-03 ENCOUNTER — Telehealth: Payer: Self-pay | Admitting: Physical Therapy

## 2014-10-03 ENCOUNTER — Telehealth: Payer: Self-pay | Admitting: Hematology and Oncology

## 2014-10-03 ENCOUNTER — Other Ambulatory Visit: Payer: Self-pay

## 2014-10-03 DIAGNOSIS — C50512 Malignant neoplasm of lower-outer quadrant of left female breast: Secondary | ICD-10-CM

## 2014-10-03 MED ORDER — LORAZEPAM 0.5 MG PO TABS: 0.5000 mg | ORAL_TABLET | Freq: Four times a day (QID) | ORAL | Status: DC | PRN

## 2014-10-03 MED ORDER — PROCHLORPERAZINE MALEATE 10 MG PO TABS: 10.0000 mg | ORAL_TABLET | Freq: Four times a day (QID) | ORAL | Status: DC | PRN

## 2014-10-03 MED ORDER — DEXAMETHASONE 4 MG PO TABS: 8.0000 mg | ORAL_TABLET | Freq: Two times a day (BID) | ORAL | Status: DC

## 2014-10-03 MED ORDER — ONDANSETRON HCL 8 MG PO TABS: 8.0000 mg | ORAL_TABLET | Freq: Two times a day (BID) | ORAL | Status: DC

## 2014-10-03 MED ORDER — LIDOCAINE-PRILOCAINE 2.5-2.5 % EX CREA: TOPICAL_CREAM | CUTANEOUS | Status: DC

## 2014-10-03 NOTE — Telephone Encounter (Signed)
[email or web address] Free to let pt know to get sched tomorrow....julie will call pt

## 2014-10-03 NOTE — Telephone Encounter (Signed)
Talked to pt informed her that her 8 visit will be shouldered by [employer], she is also aware of appt tomorrow

## 2014-10-03 NOTE — Telephone Encounter (Signed)
Spoke with Aaron Edelman at [facility] - confirmed pt prescriptions.

## 2014-10-04 ENCOUNTER — Other Ambulatory Visit: Payer: Self-pay | Admitting: Hematology and Oncology

## 2014-10-04 ENCOUNTER — Ambulatory Visit: Payer: No Typology Code available for payment source | Admitting: Physical Therapy

## 2014-10-04 ENCOUNTER — Ambulatory Visit: Payer: No Typology Code available for payment source | Admitting: Hematology and Oncology

## 2014-10-04 ENCOUNTER — Other Ambulatory Visit: Payer: Self-pay | Admitting: *Deleted

## 2014-10-04 ENCOUNTER — Other Ambulatory Visit: Payer: No Typology Code available for payment source

## 2014-10-04 ENCOUNTER — Ambulatory Visit (HOSPITAL_BASED_OUTPATIENT_CLINIC_OR_DEPARTMENT_OTHER): Payer: Self-pay

## 2014-10-04 DIAGNOSIS — Z5111 Encounter for antineoplastic chemotherapy: Secondary | ICD-10-CM

## 2014-10-04 DIAGNOSIS — M25612 Stiffness of left shoulder, not elsewhere classified: Secondary | ICD-10-CM

## 2014-10-04 DIAGNOSIS — Z5112 Encounter for antineoplastic immunotherapy: Secondary | ICD-10-CM

## 2014-10-04 DIAGNOSIS — C50512 Malignant neoplasm of lower-outer quadrant of left female breast: Secondary | ICD-10-CM

## 2014-10-04 DIAGNOSIS — M25512 Pain in left shoulder: Secondary | ICD-10-CM

## 2014-10-04 DIAGNOSIS — C50519 Malignant neoplasm of lower-outer quadrant of unspecified female breast: Secondary | ICD-10-CM

## 2014-10-04 MED ORDER — TRASTUZUMAB CHEMO INJECTION 440 MG
8.0000 mg/kg | Freq: Once | INTRAVENOUS | Status: AC
  Administered 2014-10-04: 399 mg via INTRAVENOUS
  Filled 2014-10-04: qty 19

## 2014-10-04 MED ORDER — SODIUM CHLORIDE 0.9 % IV SOLN
840.0000 mg | Freq: Once | INTRAVENOUS | Status: AC
  Administered 2014-10-04: 840 mg via INTRAVENOUS
  Filled 2014-10-04: qty 28

## 2014-10-04 MED ORDER — ONDANSETRON 16 MG/50ML IVPB (CHCC)
INTRAVENOUS | Status: AC
  Filled 2014-10-04: qty 16

## 2014-10-04 MED ORDER — DIPHENHYDRAMINE HCL 25 MG PO CAPS
ORAL_CAPSULE | ORAL | Status: AC
  Filled 2014-10-04: qty 2

## 2014-10-04 MED ORDER — SODIUM CHLORIDE 0.9 % IJ SOLN
10.0000 mL | INTRAMUSCULAR | Status: DC | PRN
  Administered 2014-10-04: 10 mL
  Filled 2014-10-04: qty 10

## 2014-10-04 MED ORDER — ACETAMINOPHEN 325 MG PO TABS
ORAL_TABLET | ORAL | Status: AC
  Filled 2014-10-04: qty 2

## 2014-10-04 MED ORDER — DOCETAXEL CHEMO INJECTION 160 MG/16ML
75.0000 mg/m2 | Freq: Once | INTRAVENOUS | Status: AC
  Administered 2014-10-04: 110 mg via INTRAVENOUS
  Filled 2014-10-04: qty 11

## 2014-10-04 MED ORDER — HEPARIN SOD (PORK) LOCK FLUSH 100 UNIT/ML IV SOLN
500.0000 [IU] | Freq: Once | INTRAVENOUS | Status: AC | PRN
  Administered 2014-10-04: 500 [IU]
  Filled 2014-10-04: qty 5

## 2014-10-04 MED ORDER — ONDANSETRON 16 MG/50ML IVPB (CHCC)
16.0000 mg | Freq: Once | INTRAVENOUS | Status: AC
  Administered 2014-10-04: 16 mg via INTRAVENOUS

## 2014-10-04 MED ORDER — DIPHENHYDRAMINE HCL 25 MG PO CAPS
50.0000 mg | ORAL_CAPSULE | Freq: Once | ORAL | Status: AC
  Administered 2014-10-04: 50 mg via ORAL

## 2014-10-04 MED ORDER — DEXAMETHASONE SODIUM PHOSPHATE 20 MG/5ML IJ SOLN
INTRAMUSCULAR | Status: AC
  Filled 2014-10-04: qty 5

## 2014-10-04 MED ORDER — DEXAMETHASONE SODIUM PHOSPHATE 20 MG/5ML IJ SOLN
20.0000 mg | Freq: Once | INTRAMUSCULAR | Status: AC
  Administered 2014-10-04: 20 mg via INTRAVENOUS

## 2014-10-04 MED ORDER — SODIUM CHLORIDE 0.9 % IV SOLN
Freq: Once | INTRAVENOUS | Status: AC
  Administered 2014-10-04: 11:00:00 via INTRAVENOUS

## 2014-10-04 MED ORDER — SODIUM CHLORIDE 0.9 % IV SOLN
660.0000 mg | Freq: Once | INTRAVENOUS | Status: AC
  Administered 2014-10-04: 660 mg via INTRAVENOUS
  Filled 2014-10-04: qty 66

## 2014-10-04 MED ORDER — ACETAMINOPHEN 325 MG PO TABS
650.0000 mg | ORAL_TABLET | Freq: Once | ORAL | Status: AC
  Administered 2014-10-04: 650 mg via ORAL

## 2014-10-04 NOTE — Therapy (Signed)
Physical Therapy Treatment  Patient Details  Name: [name] MRN: [id number] Date of Birth: May 22, 1986  Encounter Date: 10/04/2014      PT End of Session - 10/04/14 0844    Visit Number 2   Number of Visits 9   Date for PT Re-Evaluation 10/30/14   PT Start Time 0803   PT Stop Time 0843   PT Time Calculation (min) 40 min   Activity Tolerance Patient limited by pain      Past Medical History  Diagnosis Date  . Cancer     left breast  . Malignant neoplasm of breast (female), unspecified site   . Anemia     Past Surgical History  Procedure Laterality Date  . Cesarean section    . Left breast biopsy    . Breast lumpectomy Left 07/10/2014    Procedure: LEFT BREAST LUMPECTOMY;  Surgeon: Merrie Roof, MD;  Location: [place];  Service: General;  Laterality: Left;  . Portacath placement Right 09/06/2014    Procedure: INSERTION PORT-A-CATH;  Surgeon: Autumn Messing III, MD;  Location: [place];  Service: General;  Laterality: Right;  . Mastectomy w/ sentinel node biopsy Left 09/06/2014    Procedure: LEFT MASTECTOMY WITH SENTINEL LYMPH NODE BIOPSY/NODE MAPPING;  Surgeon: Autumn Messing III, MD;  Location: [place];  Service: General;  Laterality: Left;    LMP 08/06/2014  Visit Diagnosis:  Stiffness of joint, shoulder region, left  Pain in shoulder region, left      Subjective Assessment - 10/04/14 0804    Symptoms Feeling nervous about chemotherapy later today.   Currently in Pain? No/denies  unless she tries to move her arm            [facility] Adult PT Treatment/Exercise - 10/04/14 0001    Exercises   Exercises Shoulder   Manual Therapy   Manual Therapy Passive ROM   Passive ROM Left shoulder PROM in supine with stretch to patient tolerance in ER, abduction, flexion, and horizontal abduction; gentle myofascial pulling left UE.   Shoulder Exercises: Stretch   Other Shoulder Stretches shoulder cane stretches for flexion and abduction, 5 counts x 3  each with HEP instruction          PT Education - 10/04/14 0843    Education provided Yes   Education Details shoulder ROM with dowel for flexion and abduction   Person(s) Educated Patient;Spouse   Methods Explanation;Demonstration;Handout  interpreter present and assisted   Comprehension Verbalized understanding;Returned demonstration              Plan - 10/04/14 1245    PT Next Visit Plan Review HEP given today and add to that as needed (consider doorway stretch).  Continue manual therapy for PROM.     PT Home Exercise Plan Shoulder dowel ROM exercises to be done 2-3x/day.        Problem List Patient Active Problem List   Diagnosis Date Noted  . Breast cancer of lower-outer quadrant of left female breast 07/26/2014           [name],[name], PT 10/04/2014, 12:47 PM

## 2014-10-04 NOTE — Patient Instructions (Signed)
[facility] Discharge Instructions for Patients Receiving Chemotherapy  Today you received the following chemotherapy agents Taxotere, Herceptin, Perjeta and Carboplatin.  To help prevent nausea and vomiting after your treatment, we encourage you to take your nausea medication.   If you develop nausea and vomiting that is not controlled by your nausea medication, call the clinic.   BELOW ARE SYMPTOMS THAT SHOULD BE REPORTED IMMEDIATELY:  *FEVER GREATER THAN 100.5 F  *CHILLS WITH OR WITHOUT FEVER  NAUSEA AND VOMITING THAT IS NOT CONTROLLED WITH YOUR NAUSEA MEDICATION  *UNUSUAL SHORTNESS OF BREATH  *UNUSUAL BRUISING OR BLEEDING  TENDERNESS IN MOUTH AND THROAT WITH OR WITHOUT PRESENCE OF ULCERS  *URINARY PROBLEMS  *BOWEL PROBLEMS  UNUSUAL RASH Items with * indicate a potential emergency and should be followed up as soon as possible.  Feel free to call the clinic you have any questions or concerns. The clinic phone number is [phone].

## 2014-10-04 NOTE — Patient Instructions (Signed)
Flexors Stick Stretch I   Stand or sit, dowel in palm of arm to be stretched. Other arm, holding dowel at side and behind body, pushes arm being stretched forward and upward until straight over head. Hold _5__ seconds. Repeat _5-10__ times per session. Do _2-3__ sessions per day.  Copyright  VHI. All rights reserved.  Inferior Capsule Stick Stretch I   Stand or sit, dowel in palm of arm to be stretched. Other arm, holding dowel in front of body, pushes outward and upward until arm being stretched is as high to the side as possible. Hold _5__ seconds. Repeat _5-10__ times per session. Do __2-3_ sessions per day.  Copyright  VHI. All rights reserved.

## 2014-10-05 ENCOUNTER — Ambulatory Visit (HOSPITAL_BASED_OUTPATIENT_CLINIC_OR_DEPARTMENT_OTHER): Payer: No Typology Code available for payment source

## 2014-10-05 DIAGNOSIS — Z5189 Encounter for other specified aftercare: Secondary | ICD-10-CM

## 2014-10-05 DIAGNOSIS — C50512 Malignant neoplasm of lower-outer quadrant of left female breast: Secondary | ICD-10-CM

## 2014-10-05 MED ORDER — PEGFILGRASTIM INJECTION 6 MG/0.6ML ~~LOC~~
6.0000 mg | PREFILLED_SYRINGE | Freq: Once | SUBCUTANEOUS | Status: AC
  Administered 2014-10-05: 6 mg via SUBCUTANEOUS
  Filled 2014-10-05: qty 0.6

## 2014-10-07 ENCOUNTER — Other Ambulatory Visit: Payer: Self-pay | Admitting: *Deleted

## 2014-10-07 ENCOUNTER — Telehealth: Payer: Self-pay | Admitting: *Deleted

## 2014-10-07 ENCOUNTER — Telehealth: Payer: Self-pay | Admitting: Hematology and Oncology

## 2014-10-07 NOTE — Telephone Encounter (Signed)
, °

## 2014-10-07 NOTE — Telephone Encounter (Signed)
Reports muscle aches unresponsive to Tylenol. Instructed her to try OTC ibuprofen 400 mg twice daily. Also still having nausea despite the Zofran bid and Decadron bid. Instructed her to add the compazine 10 mg every 6 hours in addition to the Zofran bid. Call back tomorrow if this does not help her nausea. Suggested bland diet and push clear liquids. She is also having trouble sleeping-instructed her to try her Ativan 0.5 mg at bedtime.

## 2014-10-08 ENCOUNTER — Ambulatory Visit: Payer: MEDICAID | Admitting: Physical Therapy

## 2014-10-08 ENCOUNTER — Ambulatory Visit: Payer: No Typology Code available for payment source

## 2014-10-11 ENCOUNTER — Encounter: Payer: Self-pay | Admitting: Adult Health

## 2014-10-11 ENCOUNTER — Ambulatory Visit: Payer: No Typology Code available for payment source

## 2014-10-11 ENCOUNTER — Other Ambulatory Visit: Payer: Self-pay | Admitting: Nurse Practitioner

## 2014-10-11 ENCOUNTER — Ambulatory Visit (HOSPITAL_COMMUNITY)
Admission: RE | Admit: 2014-10-11 | Discharge: 2014-10-11 | Disposition: A | Discharge status: Home / Self Care | Payer: MEDICAID | Source: Ambulatory Visit | Attending: Hematology and Oncology | Admitting: Hematology and Oncology

## 2014-10-11 ENCOUNTER — Other Ambulatory Visit (HOSPITAL_BASED_OUTPATIENT_CLINIC_OR_DEPARTMENT_OTHER): Payer: Self-pay

## 2014-10-11 ENCOUNTER — Ambulatory Visit (HOSPITAL_BASED_OUTPATIENT_CLINIC_OR_DEPARTMENT_OTHER): Payer: Self-pay | Admitting: Adult Health

## 2014-10-11 ENCOUNTER — Telehealth: Payer: Self-pay | Admitting: Hematology and Oncology

## 2014-10-11 VITALS — BP 96/74 | HR 100 | Temp 97.7°F | Resp 18 | Ht 61.0 in | Wt 106.8 lb

## 2014-10-11 DIAGNOSIS — R1013 Epigastric pain: Secondary | ICD-10-CM

## 2014-10-11 DIAGNOSIS — T50905A Adverse effect of unspecified drugs, medicaments and biological substances, initial encounter: Secondary | ICD-10-CM

## 2014-10-11 DIAGNOSIS — K1231 Oral mucositis (ulcerative) due to antineoplastic therapy: Secondary | ICD-10-CM

## 2014-10-11 DIAGNOSIS — Z9012 Acquired absence of left breast and nipple: Secondary | ICD-10-CM | POA: Insufficient documentation

## 2014-10-11 DIAGNOSIS — C50512 Malignant neoplasm of lower-outer quadrant of left female breast: Secondary | ICD-10-CM

## 2014-10-11 DIAGNOSIS — E860 Dehydration: Secondary | ICD-10-CM

## 2014-10-11 DIAGNOSIS — C773 Secondary and unspecified malignant neoplasm of axilla and upper limb lymph nodes: Secondary | ICD-10-CM

## 2014-10-11 DIAGNOSIS — M898X9 Other specified disorders of bone, unspecified site: Secondary | ICD-10-CM

## 2014-10-11 LAB — COMPREHENSIVE METABOLIC PANEL (CC13)
ALT: 13 U/L (ref 0–55)
ANION GAP: 9 meq/L (ref 3–11)
AST: 22 U/L (ref 5–34)
Albumin: 3.8 g/dL (ref 3.5–5.0)
Alkaline Phosphatase: 122 U/L (ref 40–150)
BILIRUBIN TOTAL: 0.24 mg/dL (ref 0.20–1.20)
BUN: 6.6 mg/dL — AB (ref 7.0–26.0)
CO2: 24 meq/L (ref 22–29)
Calcium: 9.1 mg/dL (ref 8.4–10.4)
Chloride: 108 mEq/L (ref 98–109)
Creatinine: 0.7 mg/dL (ref 0.6–1.1)
GLUCOSE: 89 mg/dL (ref 70–140)
Potassium: 3.9 mEq/L (ref 3.5–5.1)
SODIUM: 141 meq/L (ref 136–145)
TOTAL PROTEIN: 6.2 g/dL — AB (ref 6.4–8.3)

## 2014-10-11 LAB — CBC WITH DIFFERENTIAL/PLATELET
BASO%: 0.3 % (ref 0.0–2.0)
BASOS ABS: 0.1 10*3/uL (ref 0.0–0.1)
EOS ABS: 0.1 10*3/uL (ref 0.0–0.5)
EOS%: 0.2 % (ref 0.0–7.0)
HEMATOCRIT: 33.8 % — AB (ref 34.8–46.6)
HEMOGLOBIN: 10.5 g/dL — AB (ref 11.6–15.9)
LYMPH%: 10.9 % — AB (ref 14.0–49.7)
MCH: 19.5 pg — ABNORMAL LOW (ref 25.1–34.0)
MCHC: 31.2 g/dL — ABNORMAL LOW (ref 31.5–36.0)
MCV: 62.6 fL — AB (ref 79.5–101.0)
MONO#: 1.1 10*3/uL — AB (ref 0.1–0.9)
MONO%: 2.7 % (ref 0.0–14.0)
NEUT%: 85.9 % — AB (ref 38.4–76.8)
NEUTROS ABS: 33.8 10*3/uL — AB (ref 1.5–6.5)
Platelets: 308 10*3/uL (ref 145–400)
RBC: 5.4 10*6/uL (ref 3.70–5.45)
RDW: 16 % — ABNORMAL HIGH (ref 11.2–14.5)
WBC: 39.3 10*3/uL — AB (ref 3.9–10.3)
lymph#: 4.3 10*3/uL — ABNORMAL HIGH (ref 0.9–3.3)

## 2014-10-11 LAB — GLUCOSE, CAPILLARY: Glucose-Capillary: 84 mg/dL (ref 70–99)

## 2014-10-11 MED ORDER — FLUDEOXYGLUCOSE F - 18 (FDG) INJECTION
5.4000 | Freq: Once | INTRAVENOUS | Status: AC | PRN
  Administered 2014-10-11: 5.4 via INTRAVENOUS

## 2014-10-11 MED ORDER — VALACYCLOVIR HCL 500 MG PO TABS: 500.0000 mg | ORAL_TABLET | Freq: Two times a day (BID) | ORAL | Status: DC

## 2014-10-11 MED ORDER — HEPARIN SOD (PORK) LOCK FLUSH 100 UNIT/ML IV SOLN
500.0000 [IU] | Freq: Once | INTRAVENOUS | Status: DC | PRN
  Filled 2014-10-11: qty 5

## 2014-10-11 MED ORDER — ONDANSETRON 8 MG/50ML IVPB (CHCC)
8.0000 mg | Freq: Once | INTRAVENOUS | Status: DC
Start: 2014-10-11 — End: 2014-10-11

## 2014-10-11 MED ORDER — SODIUM CHLORIDE 0.9 % IV SOLN
Freq: Once | INTRAVENOUS | Status: AC
  Administered 2014-10-11: 14:00:00 via INTRAVENOUS

## 2014-10-11 MED ORDER — OXYCODONE HCL 5 MG PO TABS: 5.0000 mg | ORAL_TABLET | ORAL | Status: DC | PRN

## 2014-10-11 MED ORDER — OXYCODONE-ACETAMINOPHEN 5-325 MG PO TABS
1.0000 | ORAL_TABLET | Freq: Once | ORAL | Status: AC
  Administered 2014-10-11: 1 via ORAL

## 2014-10-11 MED ORDER — OXYCODONE-ACETAMINOPHEN 5-325 MG PO TABS
ORAL_TABLET | ORAL | Status: AC
  Filled 2014-10-11: qty 1

## 2014-10-11 MED ORDER — PANTOPRAZOLE SODIUM 40 MG PO TBEC: 40.0000 mg | DELAYED_RELEASE_TABLET | Freq: Every day | ORAL | Status: DC

## 2014-10-11 MED ORDER — SODIUM CHLORIDE 0.9 % IJ SOLN
10.0000 mL | INTRAMUSCULAR | Status: DC | PRN
  Filled 2014-10-11: qty 10

## 2014-10-11 MED ORDER — OXYCODONE-ACETAMINOPHEN 5-325 MG PO TABS: 1.0000 | ORAL_TABLET | Freq: Four times a day (QID) | ORAL | Status: DC | PRN

## 2014-10-11 NOTE — Patient Instructions (Signed)
Deshidratacin, Adulto (Dehydration, Adult) Se denomina deshidratacin cuando se pierden ms lquidos de los que se ingieren. Los rganos vitales como los riones, el cerebro y el corazn no pueden funcionar sin la adecuada cantidad de lquidos y sales. Cualquier prdida de lquidos del organismo puede causar deshidratacin.  CAUSAS  Vmitos.  Diarrea.  Sudoracin excesiva.  Eliminacin excesiva de orina.  Fiebre. SNTOMAS Deshidratacin leve  Sed.  Labios resecos.  Sequedad leve de la mucosa bucal. Deshidratacin moderada   La boca est muy seca.  Ojos hundidos.  La piel no vuelve rpidamente a su lugar cuando se suelta luego de pellizcarla ligeramente.  Orina oscura y disminucin de la produccin de orina.  Disminucin de la cantidad de lgrimas.  Dolor de cabeza. Deshidratacin grave  La boca est muy seca.  Sed extrema.  Pulso rpido y dbil (ms de 100 por minuto en reposo).  Manos y pies fros.  Prdida de la capacidad para transpirar, independientemente del calor y la temperatura.  Respiracin rpida.  Labios azulados.  Confusin y aletargamiento.  Dificultad para despertarse.  Poca produccin de orina.  Falta de lgrimas. DIAGNSTICO El profesional diagnosticar deshidratacin basndose en los sntomas y el examen fsico. Las pruebas de sangre y orina ayudarn a confirmar el diagnstico. La evaluacin diagnstica suele identificar tambin las causas de la deshidratacin. TRATAMIENTO El tratamiento de la deshidratacin leve o moderada generalmente puede hacerse en el hogar mediante el aumento de la cantidad de los lquidos que se beben. Es mejor beber pequeas cantidades de lquidos con mayor frecuencia. Beber demasiado de una sola vez puede hacer que el vmito empeore. Siga las instrucciones para el cuidado en el hogar que se indican a continuacin.  La deshidratacin grave debe tratarse en el hospital en el que probablemente le administren  lquidos por va intravenosa (IV) que contienen agua y electrolitos.  INSTRUCCIONES PARA EL CUIDADO DOMICILIARIO  Pida instrucciones especficas a su mdico con respecto a la rehidratacin.  Debe ingerir gran cantidad de lquido para mantener la orina de tono claro o color amarillo plido.  Beba pequeas cantidades con frecuencia si tiene nuseas y vmitos.  Alimntese como lo hace normalmente.  Evite:  Alimentos o bebidas que contengan mucha azcar.  Gaseosas.  Jugos.  Lquidos muy calientes o fros.  Bebidas con cafena.  Alimentos muy grasos.  Alcohol.  Tabaco.  Comer demasiado a la vez.  Postres de gelatina.  Lave bien sus manos para evitar las bacterias o los virus se diseminen.  Slo tome medicamentos de venta libre o recetados para calmar el dolor, las molestias o bajar la fiebre segn las indicaciones de su mdico.  Consulte a su mdico si puede seguir tomando todos sus medicamentos recetados o de venta libre.  Concurra puntualmente a las citas de control con el mdico. SOLICITE ATENCIN MDICA SI:  Tiene dolor abdominal y este aumenta o permanece en un rea (se localiza).  Aparece una erupcin, rigidez en el cuello o dolor de cabeza intensos.  Est irritable, somnoliento o le cuesta despertarse.  Se siente dbil, mareado tiene mucha sed. SOLICITE ATENCIN MDICA DE INMEDIATO SI:  No puede retener lquidos o empeora a pesar del tratamiento.  Tiene episodios frecuentes de vmitos o diarrea.  Observa sangre o una sustancia verde (bilis) en el vmito.  Observa sangre en la materia fecal, o las heces son negras y de aspecto alquitranado.  No ha orinado durante 6 a 8 horas, o slo ha orinado una cantidad pequea de orina oscura.  Tiene fiebre.    Se desmaya. ASEGRESE QUE:  Comprende estas instrucciones.  Controlar su enfermedad.  Solicitar ayuda inmediatamente si no mejora o si empeora. Document Released: 11/01/2005 Document Revised:  01/24/2012 ExitCare Patient Information 2015 [employer]. This information is not intended to replace advice given to you by your health care provider. Make sure you discuss any questions you have with your health care provider.  

## 2014-10-11 NOTE — Telephone Encounter (Signed)
pt given appt schedule while getting fluids in exam room, including tomorrow.

## 2014-10-11 NOTE — Addendum Note (Signed)
Addended by: Marcelino Duster on: 10/11/2014 04:04 PM   Modules accepted: Orders

## 2014-10-11 NOTE — Patient Instructions (Signed)
Acetaminophen; Oxycodone tablets Qu es este medicamento? El compuesto ACETAMINOFENO; OXICODONA es un analgsico. Se utiliza para tratar los dolores leves a moderados. Este medicamento puede ser utilizado para otros usos; si tiene alguna pregunta consulte con su proveedor de atencin mdica o con su farmacutico. MARCAS COMERCIALES DISPONIBLES: Endocet, Magnacet, Narvox, Percocet, Perloxx, Primalev, Primlev, Roxicet, Xolox [employer] debo informar a mi profesional de la salud antes de tomar este medicamento? Necesita saber si usted presenta alguno de los [employer] o situaciones: -tumor cerebral -enfermedad de Crohn, enfermedad intestinal inflamatoria o colitis ulcerativa -abuso de drogas o drogadiccin -lesin de la cabeza -problemas cardiacos o circulatorios -si consume alcohol con frecuencia -enfermedad renal o problemas al orinar -enfermedad heptica -enfermedad pulmonar, asma o dificultades al respirar -una reaccin alrgica o inusual al acetaminofeno, a la oxicodona, a otros analgsicos opiceos, a otros medicamentos, alimentos, colorantes o conservantes -si est embarazada o buscando quedar embarazada -si est amamantando a un beb Cmo debo utilizar este medicamento? Tome este medicamento por va oral con un vaso lleno de agua. Siga las instrucciones de la etiqueta del [place]. Tome sus dosis a intervalos regulares. No tome su medicamento con una frecuencia mayor que la indicada. Hable con su pediatra para informarse acerca del uso de este medicamento en nios. Puede requerir Sales executive. Los pacientes de ms de 65 aos de edad pueden presentar reacciones ms fuertes a Fish farm manager y Designer, industrial/product dosis menores. Sobredosis: Pngase en contacto inmediatamente con un centro toxicolgico o una sala de urgencia si usted cree que haya tomado demasiado medicamento. ATENCIN: [employer] es solo para usted. No comparta este medicamento con nadie. Qu sucede si me  olvido de una dosis? Si olvida una dosis, tmela lo antes posible. Si es casi la hora de la prxima dosis, tome slo esa dosis. No tome dosis adicionales o dobles. Qu puede interactuar con este medicamento? -alcohol -antihistamnicos -barbitricos tales como el amobarbital, butalbital, butabarbital, metohexital, pentobarbital, fenobarbital, tiopental y secobarbital -benztropina -medicamentos para problemas de vejiga, tales como solifenacina, trospium, oxibutinina, tolterodina, hiosciamina y metscopolamina -medicamentos para problemas respiratorios, tales como ipratropio y tiotropio -medicamentos para ciertos problemas estomacales o intestinales, tales como propantelina, homatropina metilbromuro, Sports administrator, atropina, belladona y diciclomina -anestsicos generales, tales como etomidato, [place], xido nitroso, propofol, desflurano, enflurano, halotano, isoflurano y sevoflurano -medicamentos para la depresin, ansiedad o trastornos psicticos -medicamentos para dormir -relajantes musculares -naltrexona -medicamentos narcticos (opiceos) para Conservation officer, historic buildings -fenotiazinas, tales como perfenacina, tioridazina, clorpromacina, mesoridazina, flufenazina, proclorperazina, promazina y trifluoperazina -escopolamina -tramadol -trihexifenidilo Puede ser que esta lista no menciona todas las posibles interacciones. Informe a su profesional de [employer]	[place] de [employer] productos a base de hierbas, medicamentos de [place] o suplementos nutritivos que est tomando. Si usted fuma, consume bebidas alcohlicas o si utiliza drogas ilegales, indqueselo tambin a su profesional de [employer]	[place]. Algunas sustancias pueden interactuar con su medicamento. A qu debo estar atento al usar [employer]? Si el dolor no desaparece, si empeora o si experimenta un dolor nuevo o de tipo diferente, consulte a su mdico o a su profesional de [employer]	[place]. Usted puede desarrollar tolerancia al medicamento. La tolerancia significa  que necesitar una dosis ms alta para Best boy. Tolerancia es normal y esperada cuando est tomando este medicamento por un largo perodo de [place]. No suspenda el uso de su medicamento repentinamente debido a que puede Engineer, materials reaccin severa. Su cuerpo se acostumbra a Fish farm manager. Esto NO significa que sea adicto. La adiccin es un comportamiento que hace referencia a  la obtencin y utilizacin de un medicamento con fines que no son mdicos. Si tiene Social research officer, government, existe una razn mdica para que usted tome un analgsico. Su mdico le indicar la cantidad de medicamento que Tree surgeon. Si su mdico desea que [employer], la dosis ser reducida gradualmente para Research officer, political party secundarios. Puede experimentar somnolencia o mareos. No conduzca ni utilice maquinaria ni haga nada que Associate Professor en estado de alerta hasta que sepa cmo le afecta este medicamento. No se siente ni se ponga de pie con rapidez, especialmente si es un paciente de edad avanzada. Esto reduce el riesgo de mareos o [employer]. El alcohol puede interferir con el efecto de este medicamento. Evite consumir bebidas alcohlicas. Hay distintos tipos de medicamentos narcticos (opiceos) para Conservation officer, historic buildings. Si usted toma ms que un tipo a la [employer], podr tener ms [employer]. Dar a su proveedor de atencin medica una lista de todos los medicamentos que usted [place]. Su mdico le informar la cantidad de medicamento que Aeronautical engineer. No tome ms medicamento que lo indicado. Comunquese con emergencia para ayuda si tiene problemas para respirar. Este medicamento causar estreimiento. Trate de evacuar los intestinos al menos cada 2  3 das. Si no evacua los intestinos durante 3 das, comunquese con su mdico o con su profesional de [employer]	[place]. No tome Tylenol (acetaminofeno) u otros medicamentos que contienen acetaminofeno con este medicamento. Tomando mucho acetaminofeno puede ser muy peligroso. Muchos  medicamentos de venta libre contienen acetaminofeno. Lea siempre las etiquetas cuidadosamente para evitar el tomar ms acetaminofeno. Qu efectos secundarios puedo tener al [employer] este medicamento? Efectos secundarios que debe informar a su mdico o a Barrister's clerk de la salud tan pronto como sea posible: -Scientist, clinical (histocompatibility and immunogenetics), tales como erupcin cutnea, picazn o urticarias, hinchazn de la cara, labios o lengua -dificultades respiratorias, sibilancias -confusin -sensacin de desmayos o aturdimiento -dolor de estmago severo -cansancio o debilidad inusual -color amarillento de los ojos o la piel Efectos secundarios que, por lo general, no requieren atencin mdica (debe informarlos a su mdico o a su profesional de la salud si persisten o si son molestos): -mareos -somnolencia -nuseas -vmitos Puede ser que esta lista no menciona todos los posibles efectos secundarios. Comunquese a su mdico por asesoramiento mdico [employer]. Usted puede informar los efectos secundarios a la FDA por telfono al 1-800-FDA-1088. Dnde debo guardar mi medicina? Mantngala fuera del alcance de los nios. Este medicamento puede ser abusado. Mantenga su medicamento en un lugar seguro para protegerlo contra robos. No comparta este medicamento con nadie. Es peligroso vender o ceder este medicamento y est prohibido por la ley. Gurdelo a [employer], entre 20 y 54 grados C (33 y 86 grados F). Mantenga el envase bien cerrado. Protjalo de Naval architect. Este medicamento puede causar muerte y sobredosis accidental si es tomado por otros adultos, nios o Copy. Tire los medicamentos que no haya utilizado al inodoro para reducir la posibilidad de dao. No use el medicamento despus de la fecha de vencimiento. ATENCIN: Este folleto es un resumen. Puede ser que no cubra toda la posible informacin. Si usted tiene preguntas acerca de esta medicina, consulte con su mdico, su farmacutico o  su profesional de Technical sales engineer.  2015, Elsevier/Gold Standard. (2013-07-16 19:44:45)  Mucositis oral  (Oral Mucositis) La mucositis oral es una afeccin de la boca que puede desarrollarse a partir del tratamiento que se [place] para Veterinary surgeon. En esta afeccin, aparecen llagas en los labios, las encas, la  lengua y el techo o el piso de la boca.   CAUSAS  La mucositis oral puede aparecer en cualquier persona que est en tratamiento para el cncer, por ejemplo:   Medicamentos para combatir el cncer (quimioterapia).  La radiacin (rayos X u otros rayos de alta energa) para el cncer de cabeza o cuello.  Transplante de clulas madre y de mdula sea. La causa de la mucositis oral no es una infeccin. Sin embargo, las llagas pueden infectarse despus de que se formen. La infeccin puede hacer que la mucositis oral empeore.  Sin embargo, los siguientes factores pueden aumentar el riesgo de Actor mucositis oral.   Comptroller.  Problemas dentales o enfermedades bucales.  El hbito de fumar.  [place] alcohol.  Sufrir otras enfermedades como diabetes, el virus de nmunodeficiencia humana (VIH), sndrome de inmunodeficiencia adquirida (SIDA), o enfermedad renal.  No beber suficiente agua.  Tener dentadura postiza que no se [place].  Ser un nio. Los nios tienen ms probabilidades que los adultos de desarrollar mucositis oral, pero suelen curarse ms rpidamente.  Ser personas de [place]. [place] [employer] son ms propensos a desarrollar mucositis oral.  SNTOMAS  Los sntomas varan. Pueden ser leves o graves. Por lo general aparecen de 7 a 10 das despus de Biochemist, clinical. Los sntomas pueden incluir:   lceras que sangran en la boca.  Cambios de color dentro de la boca. Aparecen zonas rojas y brillantes.  Manchas blancas o pus en la boca.  Dolor en la boca y en la garganta.  Dolor al hablar.  Sequedad y  sensacin de ardor en la boca.  Saliva seca y espesa.  Dificultad para comer, beber y tragar.  Prdida de peso y desnutricin. Ocurre debido a la dificultad para comer. DIAGNSTICO  Un mdico le revisar la boca. Luego, evaluar la afeccin y Magazine features editor. Este sistema de clasificacin ayudar a su mdico a decidir el tratamiento:   Grado 1: El interior de la boca est dolorido y rojo.  Alan Ripper 2: Hay enrojecimiento en la boca. Hay lceras abiertas. Hay molestias para ingerir los alimentos.  Grado 3: Hay llagas abiertas. La boca est muy roja. Es muy difcil de Programmer, applications.  Grado 4: No puede tragar ninguna bebida ni alimento. TRATAMIENTO  La mucositis oral suele curar sin tratamiento. A veces, algunos cambios en el tratamiento para el cncer pueden ayudar. Es muy importante mantener la boca tan limpia y [place] de grmenes como sea posible.   Algunos medicamentos pueden Animator. Pueden ser necesarios diferentes tipos de medicamentos, por ejemplo:  Un antibitico para combatir la infeccin, si la hubiere.  Medicamentos para ayudar a que las clulas de las mucosas curen ms rpidamente.  Una crema hidratante a base de agua para los labios, si estn afectados.  Los mtodos para Financial controller pueden ser:  Consulting civil engineer boca hmeda. Puede chupar cubitos de hielo o helados de agua sin azcar.  Analgsicos para aplicar alrededor de la boca. Adormecern la boca y Proofreader (anestsicos tpicos).  Enjuagues bucales especficos.  Gel medicinal recetado. El gel recubre la boca. Protege las terminaciones nerviosas y Conservation officer, historic buildings disminuye.  Medicamentos narcticos para Conservation officer, historic buildings. Estos son medicamentos fuertes. Pueden ser utilizados si el dolor es muy intenso.  El cuidado de la boca permite mantener la boca lo ms sana posible y [place] a prevenir la infeccin. El cuidado de la boca incluye:  Un control dental. El  odontlogo se asegurar de que usted no tiene  [employer] dientes que puedan causar infeccin. Trate de hacer una revisin dental antes de comenzar el tratamiento para Science writer.  Cepllese los dientes varias veces al da. Use un cepillo de cerdas suaves. Cambie el cepillo con frecuencia. Utilice nicamente las cremas dentales suaves. Pregntele a su mdico qu producto sera el mejor para usted. Use tambin el hilo dental.  Enjuague su boca despus de cada comida. Enjuague nuevamente la hora de acostarse. No use enjuagues bucales que contengan alcohol. Pregntele a su mdico cul es el mejor para usted. INSTRUCCIONES PARA EL CUIDADO EN EL HOGAR   Solo tome medicamentos de venta libre o recetados para Conservation officer, historic buildings, Tree surgeon o fiebre, segn las indicaciones del [place] indicaciones.  No fume.  No consuma alcohol.  Consuma slo alimentos blandos, suaves, hasta que las llagas se curen. Evite alimentos y bebidas muy azucarados y cidos.  Consulte a su mdico si debe aadir batidos de protenas a su dieta para evitar la desnutricin y la prdida de [place].  Debe ingerir gran cantidad de lquido para mantener la orina de tono claro o color amarillo plido.  Si tiene dentadura postiza, squesela con frecuencia.  Contine revisando la boca todos los das en busca de signos de mucositis oral.  Cumpla con todas las visitas de control. SOLICITE ATENCIN MDICA SI:   Nota enrojecimiento, dolor o sequedad en la boca.  Siente dolor en la boca o en la garganta que le hace difcil tragar o hablar. SOLICITE ATENCIN MDICA DE INMEDIATO SI:   El dolor en la boca o en la garganta empeora y no mejora con analgsicos.  Tiene un sangrado importante en la boca.  Tiene nuevas llagas abiertas en la boca.  Nota que se forman zonas de pus en la boca.  Usted no puede tragar alimentos slidos ni lquidos.  Tiene fiebre. Document Released: 10/18/2012 [facility] Patient Information 2015 [place]. This information is not  intended to replace advice given to you by your health care provider. Make sure you discuss any questions you have with your health care provider.

## 2014-10-11 NOTE — Progress Notes (Signed)
Patient Care Team: No Pcp Per Patient as PCP - General (General Practice)  DIAGNOSIS: Breast cancer of lower-outer quadrant of left female breast   Staging form: Breast, AJCC 7th Edition     Clinical: No stage assigned - Unsigned     Pathologic: Stage IIIC (T2, N3a, cM0) - Signed by Rulon Eisenmenger, MD on 09/11/2014   SUMMARY OF ONCOLOGIC HISTORY:   Breast cancer of lower-outer quadrant of left female breast   06/06/2014 Imaging Palpable Left breast mass at 12:00 position 1.9 cm, initial biopsy 06/12/2014 revealed fibrocystic changes.   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 The genes tested were ATM, BARD1, BRCA1, BRCA2, BRIP1, CDH1, CHEK2, MRE11A, MUTYH, NBN, NF1, PALB2, PTEN, RAD50, RAD51C, RAD51D, and TP53.   10/04/2014 -  Chemotherapy Patient was started on adjuvant chemotherapy with Taxotere, Carboplatin, Herceptin, Perjeta.      CHIEF COMPLIANT: adjuvant chemotherapy  INTERVAL HISTORY: [name] is a 28 year old Hispanic lady with above-mentioned history of HER-2 positive breast cancer who underwent left mastectomy. Final pathology revealed multifocal invasive adenocarcinoma with lymphovascular invasion. 18/21 lymph nodes were positive with extracapsular extension.  She is accompanied by her husband, children and a Spanish interpreter. She is here for evaluation following chemotherapy with Taxotere, carboplatin, herceptin, perjeta.  She is feeling very poorly.  She has  blisters on her lips and mouth.  She is having a lot of difficulty swallowing and isn't eating very much either.  She denies dizziness.  She does have nausea and she takes her anti-emetics.  For the past 3 days she has notice a burning sensation when food does hit her stomach.  She has taken Tylenol for the bone pain that she has been experiencing.  It has not helped her very much at all.    REVIEW OF SYSTEMS:   A 10 point review of systems was conducted and is otherwise negative except for what is noted above.    Past Medical History  Diagnosis Date  . Cancer     left breast  . Malignant neoplasm of breast (female), unspecified site   . Anemia    Past Surgical History  Procedure Laterality Date  . Cesarean section    . Left breast biopsy    . Breast lumpectomy Left 07/10/2014    Procedure: LEFT BREAST LUMPECTOMY;  Surgeon: Merrie Roof, MD;  Location: [place];  Service: General;  Laterality: Left;  . Portacath placement Right 09/06/2014    Procedure: INSERTION PORT-A-CATH;  Surgeon: Autumn Messing III, MD;  Location: [place];  Service: General;  Laterality: Right;  . Mastectomy w/ sentinel node biopsy Left 09/06/2014    Procedure: LEFT MASTECTOMY WITH SENTINEL LYMPH NODE BIOPSY/NODE MAPPING;  Surgeon: Autumn Messing III, MD;  Location: [place];  Service: General;  Laterality: Left;   History   Social History  . Marital Status: Married    Spouse Name: N/A    Number of Children: N/A  . Years of Education: N/A   Social History Main Topics  . Smoking status: Never Smoker   .  Smokeless tobacco: Never Used  . Alcohol Use: No  . Drug Use: No  . Sexual Activity: Yes    Birth Control/ Protection: Condom   Other Topics Concern  . None   Social History Narrative   Family History  Problem Relation Age of Onset  . Diabetes Mother   . Hypertension Mother   . Diabetes Maternal Grandmother   . Hypertension Maternal Grandmother   . Stomach cancer Maternal Grandmother   .  Stomach cancer Maternal Grandfather     ALLERGIES:  has No Known Allergies.  MEDICATIONS:  Current Outpatient Prescriptions  Medication Sig Dispense Refill  . dexamethasone (DECADRON) 4 MG tablet Take 2 tablets (8 mg total) by mouth 2 (two) times daily. Start the day before Taxotere. Then again the day after chemo for 3 days. 30 tablet 1  . ibuprofen (ADVIL,MOTRIN) 600 MG tablet Take 600 mg by mouth every 6 (six) hours as needed.    . lidocaine-prilocaine (EMLA) cream Apply to affected area once 30 g 3  . lidocaine-prilocaine (EMLA) cream Apply to affected area once 30 g 3  . LORazepam (ATIVAN) 0.5 MG tablet Take 1 tablet (0.5 mg total) by mouth every 6 (six) hours as needed (Nausea or vomiting). 30 tablet 0  . methocarbamol (ROBAXIN) 500 MG tablet Take 1 tablet (500 mg total) by mouth every 8 (eight) hours as needed for muscle spasms. 20 tablet 0  . Multiple Vitamin (MULTIVITAMIN WITH MINERALS) TABS tablet Take 1 tablet by mouth daily.    . ondansetron (ZOFRAN) 8 MG tablet Take 1 tablet (8 mg total) by mouth 2 (two) times daily. Start the day after chemo for 3 days. Then take as needed for nausea or vomiting. 30 tablet 1  . oxyCODONE (OXY IR/ROXICODONE) 5 MG immediate release tablet Take 1 tablet (5 mg total) by mouth every 4 (four) hours as needed for severe pain. 30 tablet 0  . pantoprazole (PROTONIX) 40 MG tablet Take 1 tablet (40 mg total) by mouth daily. 30 tablet 0  . prochlorperazine (COMPAZINE) 10 MG tablet Take 1 tablet (10 mg total) by mouth every 6 (six) hours as needed (Nausea or vomiting). 30 tablet 1  . valACYclovir (VALTREX) 500 MG tablet Take 1 tablet (500 mg total) by mouth 2 (two) times daily. 60 tablet 2   Current Facility-Administered Medications  Medication Dose Route Frequency Provider Last Rate Last Dose  . heparin lock flush 100 unit/mL  500 Units Intracatheter Once PRN Veronica Headland, NP      . ondansetron (ZOFRAN) IVPB 8 mg  8 mg Intravenous Once Veronica Headland, NP      . sodium chloride 0.9 % injection 10 mL  10 mL Intracatheter PRN Veronica Headland, NP        PHYSICAL EXAMINATION: ECOG PERFORMANCE STATUS: 1 - Symptomatic but completely ambulatory  Filed Vitals:   10/11/14 1314  BP: 96/74  Pulse: 100  Temp: 97.7 F (36.5 C)  Resp: 18   Filed Weights   10/11/14 1314  Weight: 106 lb 12.8 oz (48.444 kg)    GENERAL: Patient is an ill appearing female in discomfort HEENT:  Sclerae anicteric.  Severe mucositis and ulcerations on , lips are also inflamed.    NODES:  No cervical, supraclavicular, or axillary lymphadenopathy palpated.  BREAST EXAM:  Deferred. LUNGS:  Clear to auscultation bilaterally.  No wheezes or rhonchi. HEART:  Regular rate and rhythm. No murmur appreciated. ABDOMEN:  Soft, nontender.  Positive, normoactive bowel sounds. No  organomegaly palpated. MSK:  No focal spinal tenderness to palpation. Full range of motion bilaterally in the upper extremities. EXTREMITIES:  No peripheral edema.   SKIN:  Clear with no obvious rashes or skin changes. No nail dyscrasia. NEURO:  Nonfocal. Well oriented.  Appropriate affect.    LABORATORY DATA:  I have reviewed the data as listed   Chemistry      Component Value Date/Time   NA 141 10/11/2014 1304   NA 140 09/04/2014 1532   K 3.9 10/11/2014 1304   K 3.7 09/04/2014 1532   CL 105 09/04/2014 1532   CO2 24 10/11/2014 1304   CO2 25 09/04/2014 1532   BUN 6.6* 10/11/2014 1304   BUN 7 09/04/2014 1532   CREATININE 0.7 10/11/2014 1304   CREATININE 0.58 09/04/2014 1532      Component Value Date/Time   CALCIUM 9.1 10/11/2014 1304   CALCIUM 9.5 09/04/2014 1532   ALKPHOS 122 10/11/2014 1304   AST 22 10/11/2014 1304   ALT 13 10/11/2014 1304   BILITOT 0.24 10/11/2014 1304       Lab Results  Component Value Date   WBC 39.3* 10/11/2014   HGB 10.5* 10/11/2014   HCT 33.8* 10/11/2014   MCV 62.6* 10/11/2014   PLT 308 10/11/2014   NEUTROABS 33.8* 10/11/2014      RADIOGRAPHIC STUDIES: I have personally reviewed the radiology reports and agreed with their findings. Nm Pet Image Initial (pi) Skull Base To Thigh  10/11/2014   CLINICAL DATA:  Initial treatment strategy for breast carcinoma in lower outer quadrant of left breast.  EXAM: NUCLEAR MEDICINE PET SKULL BASE TO THIGH  TECHNIQUE: 5.4 mCi F-18 FDG was injected intravenously. Full-ring PET imaging was performed from the skull base to thigh after the radiotracer. CT data was obtained and used for attenuation correction and anatomic localization.  FASTING BLOOD GLUCOSE:  Value: 84 mg/dl  COMPARISON:  None.  FINDINGS: NECK  No hypermetabolic lymph nodes in the neck.  CHEST  No hypermetabolic mediastinal or hilar nodes. No suspicious pulmonary nodules on the CT scan. Expected postop changes seen from previous left mastectomy and axillary lymph node dissection.  ABDOMEN/PELVIS  No abnormal hypermetabolic activity within the liver, pancreas, adrenal glands, or spleen. No hypermetabolic lymph nodes in the abdomen or pelvis.  SKELETON  Diffuse hypermetabolic activity seen throughout skeletal bone marrow is most likely due to post treatment metabolic response.  IMPRESSION: No definite evidence of residual or metastatic carcinoma.  Diffuse hypermetabolic activity throughout skeletal bone marrow, most likely due to post treatment response.   Electronically Signed   By: Earle Gell M.D.   On: 10/11/2014 11:58     ASSESSMENT: 28 year old woman with T2N3a M0 stage IIIC, grade III,  invasive ductal carcinoma, ER 58%, PR 13%, Ki-67 33%, HER-2/neu positive.    1. Patient underwent surgery on 09/06/14 that demonstrated T2N3 disease.  2. Genetic testing was negative.  3. PET/CT didn't show any signs of metastases.  It was done on 10/11/2014.  PLAN:   Palak looks pitiful after receiving chemotherapy.  She will get IV fluids today and tomorrow of 1 Liter normal saline.  Her labs demonstrate elevated WBC due to the  Neulasta and treatment related anemia that we will monitor.  For her chemotherapy induced mucositis I ordered magic mouthwash and Valtrex BID.    For her dehydration and decreased intake due to mucositis she will receive IV fluids today and tomorrow of one liter each.  She also has anti-emetics  ordered if she would like them.    For the bone pain due to [facility] I ordered Oxycodone.  She will receive a percocet today while in clinic.    For her dyspepsia, I prescribed Protonix for her to take daily.    [name] was given detailed instructions to contact us should she feel worse or need anything whatsoever.   She was recommended f/u with Dr. Lindi Adie next week to review her toxicities along with on 10/25/14 for labs, evaluation and cycle 2 of chemotherapy.     The patient has a good understanding of the overall plan. she agrees with it. She will call with any problems that may develop before her next visit here.  I spent 40 minutes counseling the patient face to face.  The total time spent in the appointment was 50 minutes.  [name], [place] [phone] 10/11/2014 2:22 PM

## 2014-10-12 ENCOUNTER — Ambulatory Visit (HOSPITAL_BASED_OUTPATIENT_CLINIC_OR_DEPARTMENT_OTHER): Payer: No Typology Code available for payment source

## 2014-10-12 VITALS — BP 101/66 | HR 85 | Temp 98.3°F | Resp 18

## 2014-10-12 DIAGNOSIS — C50512 Malignant neoplasm of lower-outer quadrant of left female breast: Secondary | ICD-10-CM

## 2014-10-12 DIAGNOSIS — E860 Dehydration: Secondary | ICD-10-CM

## 2014-10-12 DIAGNOSIS — K1231 Oral mucositis (ulcerative) due to antineoplastic therapy: Secondary | ICD-10-CM

## 2014-10-12 MED ORDER — HEPARIN SOD (PORK) LOCK FLUSH 100 UNIT/ML IV SOLN
500.0000 [IU] | Freq: Once | INTRAVENOUS | Status: AC | PRN
  Administered 2014-10-12: 500 [IU]
  Filled 2014-10-12: qty 5

## 2014-10-12 MED ORDER — SODIUM CHLORIDE 0.9 % IJ SOLN
10.0000 mL | INTRAMUSCULAR | Status: DC | PRN
  Administered 2014-10-12: 10 mL
  Filled 2014-10-12: qty 10

## 2014-10-12 MED ORDER — SODIUM CHLORIDE 0.9 % IV SOLN
INTRAVENOUS | Status: DC
  Administered 2014-10-12: 10:00:00 via INTRAVENOUS

## 2014-10-12 NOTE — Patient Instructions (Signed)
Dehydration, Adult Dehydration is when you lose more fluids from the body than you take in. Vital organs like the kidneys, brain, and heart cannot function without a proper amount of fluids and salt. Any loss of fluids from the body can cause dehydration.  CAUSES   Vomiting.  Diarrhea.  Excessive sweating.  Excessive urine output.  Fever. SYMPTOMS  Mild dehydration  Thirst.  Dry lips.  Slightly dry mouth. Moderate dehydration  Very dry mouth.  Sunken eyes.  Skin does not bounce back quickly when lightly pinched and released.  Dark urine and decreased urine production.  Decreased tear production.  Headache. Severe dehydration  Very dry mouth.  Extreme thirst.  Rapid, weak pulse (more than 100 beats per minute at rest).  Cold hands and feet.  Not able to sweat in spite of heat and temperature.  Rapid breathing.  Blue lips.  Confusion and lethargy.  Difficulty being awakened.  Minimal urine production.  No tears. DIAGNOSIS  Your caregiver will diagnose dehydration based on your symptoms and your exam. Blood and urine tests will help confirm the diagnosis. The diagnostic evaluation should also identify the cause of dehydration. TREATMENT  Treatment of mild or moderate dehydration can often be done at home by increasing the amount of fluids that you drink. It is best to drink small amounts of fluid more often. Drinking too much at one time can make vomiting worse. Refer to the home care instructions below. Severe dehydration needs to be treated at the hospital where you will probably be given intravenous (IV) fluids that contain water and electrolytes. HOME CARE INSTRUCTIONS   Ask your caregiver about specific rehydration instructions.  Drink enough fluids to keep your urine clear or pale yellow.  Drink small amounts frequently if you have nausea and vomiting.  Eat as you normally do.  Avoid:  Foods or drinks high in sugar.  Carbonated  drinks.  Juice.  Extremely hot or cold fluids.  Drinks with caffeine.  Fatty, greasy foods.  Alcohol.  Tobacco.  Overeating.  Gelatin desserts.  Wash your hands well to avoid spreading bacteria and viruses.  Only take over-the-counter or prescription medicines for pain, discomfort, or fever as directed by your caregiver.  Ask your caregiver if you should continue all prescribed and over-the-counter medicines.  Keep all follow-up appointments with your caregiver. SEEK MEDICAL CARE IF:  You have abdominal pain and it increases or stays in one area (localizes).  You have a rash, stiff neck, or severe headache.  You are irritable, sleepy, or difficult to awaken.  You are weak, dizzy, or extremely thirsty. SEEK IMMEDIATE MEDICAL CARE IF:   You are unable to keep fluids down or you get worse despite treatment.  You have frequent episodes of vomiting or diarrhea.  You have blood or green matter (bile) in your vomit.  You have blood in your stool or your stool looks black and tarry.  You have not urinated in 6 to 8 hours, or you have only urinated a small amount of very dark urine.  You have a fever.  You faint. MAKE SURE YOU:   Understand these instructions.  Will watch your condition.  Will get help right away if you are not doing well or get worse. Document Released: 11/01/2005 Document Revised: 01/24/2012 Document Reviewed: 06/21/2011 ExitCare Patient Information 2015 [employer]. This information is not intended to replace advice given to you by your health care provider. Make sure you discuss any questions you have with your health care   provider.  

## 2014-10-14 ENCOUNTER — Ambulatory Visit: Payer: No Typology Code available for payment source | Admitting: Physical Therapy

## 2014-10-14 DIAGNOSIS — M25612 Stiffness of left shoulder, not elsewhere classified: Secondary | ICD-10-CM

## 2014-10-14 DIAGNOSIS — Z9012 Acquired absence of left breast and nipple: Secondary | ICD-10-CM

## 2014-10-14 DIAGNOSIS — IMO0001 Reserved for inherently not codable concepts without codable children: Secondary | ICD-10-CM

## 2014-10-14 NOTE — Patient Instructions (Signed)
Flexion   Stand with hands clasped over head. Extend arms upward as far as possible. Hold __3__ seconds. Repeat __10__ times. Do __3__ sessions per day.  Copyright  VHI. All rights reserved.  Closed Chain: Shoulder Abduction / Adduction - on Wall   One hand on wall, step to side and return. Stepping causes shoulder to abduct and adduct. Step _10__ times, __3_ times per day.  http://ss.exer.us/267   Copyright  VHI. All rights reserved.

## 2014-10-14 NOTE — Therapy (Signed)
Physical Therapy Treatment  Patient Details  Name: [name] MRN: [id number] Date of Birth: 09-01-1986  Encounter Date: 10/14/2014      PT End of Session - 10/14/14 1153    Visit Number 3   Number of Visits 9   Date for PT Re-Evaluation 10/30/14   PT Start Time 1115   PT Stop Time 1158   PT Time Calculation (min) 43 min   Activity Tolerance Patient tolerated treatment well   Behavior During Therapy [facility] for tasks assessed/performed      Past Medical History  Diagnosis Date  . Cancer     left breast  . Malignant neoplasm of breast (female), unspecified site   . Anemia     Past Surgical History  Procedure Laterality Date  . Cesarean section    . Left breast biopsy    . Breast lumpectomy Left 07/10/2014    Procedure: LEFT BREAST LUMPECTOMY;  Surgeon: Merrie Roof, MD;  Location: [place];  Service: General;  Laterality: Left;  . Portacath placement Right 09/06/2014    Procedure: INSERTION PORT-A-CATH;  Surgeon: Autumn Messing III, MD;  Location: [place];  Service: General;  Laterality: Right;  . Mastectomy w/ sentinel node biopsy Left 09/06/2014    Procedure: LEFT MASTECTOMY WITH SENTINEL LYMPH NODE BIOPSY/NODE MAPPING;  Surgeon: Autumn Messing III, MD;  Location: [place];  Service: General;  Laterality: Left;    LMP 10/11/2014  Visit Diagnosis:  Decreased range of motion of left shoulder  Status post mastectomy, left  History of left breast cancer , known active      Subjective Assessment - 10/14/14 1118    Symptoms No complaints of pain.  I feel like my shoulder can reach a little higher   Pertinent History Mastectomy 09/06/14 on left.   Patient Stated Goals Raise left arm up   Currently in Pain? No/denies            [facility] Adult PT Treatment/Exercise - 10/14/14 0001    Shoulder Exercises: Supine   Flexion AAROM  Finger ladder flexion and abduction x5 each   Shoulder Exercises: Pulleys   Flexion 3 minutes   ABduction 3 minutes   with verbal and tactile cues for technique   Manual Therapy   Manual Therapy Passive ROM   Passive ROM Left shoulder passive ROM in supine all planes ot patient tolerance; passive neural stretch to left upper extremity   Shoulder Exercises: Therapy Ball   Flexion 10 reps   ABduction 10 reps   Shoulder Exercises: ROM/Strengthening   "W" Arms With back against wall reaching into abduction x5          PT Education - 10/14/14 1149    Education provided Yes   Education Details Shoulder AAROM flexion and abduction on wall home exercises   Person(s) Educated Patient   Methods Explanation;Demonstration;Tactile cues;Verbal cues;Handout   Comprehension Verbalized understanding;Returned demonstration              Plan - 10/14/14 1159    Clinical Impression Statement Patient progressed well during treatment today.  ROM increased as she worked on exercises.  She appeared apprehensive about doing the exercises but then pleasantly surprised with her increased ROM.  She will benefit from continued PT to increase ROM.   Pt will benefit from skilled therapeutic intervention in order to improve on the following deficits Increased fascial restricitons;Impaired UE functional use;Decreased range of motion;Pain   Rehab Potential Excellent   PT Frequency 2x /  week   PT Treatment/Interventions Therapeutic exercise;Patient/family education;Manual techniques   PT Next Visit Plan Review HEP given today and contune with PROM and exercise to increase mobility.  Measure left shoulder AROM.   PT Home Exercise Plan New HEP given today for active assist flexion ROM and abduction on wall.  To be done 3x/day.   Consulted and Agree with Plan of Care Patient;Other (Comment)  Interpreter present for treatment.        Problem List Patient Active Problem List   Diagnosis Date Noted  . Breast cancer of lower-outer quadrant of left female breast 07/26/2014      Nixon Sparr,MARTI COOPER , PT  10/14/2014, 12:04  PM

## 2014-10-16 ENCOUNTER — Ambulatory Visit: Payer: Self-pay | Attending: General Surgery | Admitting: Physical Therapy

## 2014-10-16 DIAGNOSIS — Z5189 Encounter for other specified aftercare: Secondary | ICD-10-CM | POA: Insufficient documentation

## 2014-10-16 DIAGNOSIS — Z9012 Acquired absence of left breast and nipple: Secondary | ICD-10-CM | POA: Insufficient documentation

## 2014-10-16 DIAGNOSIS — M25512 Pain in left shoulder: Secondary | ICD-10-CM | POA: Insufficient documentation

## 2014-10-16 DIAGNOSIS — IMO0001 Reserved for inherently not codable concepts without codable children: Secondary | ICD-10-CM

## 2014-10-16 DIAGNOSIS — C50512 Malignant neoplasm of lower-outer quadrant of left female breast: Secondary | ICD-10-CM | POA: Insufficient documentation

## 2014-10-16 DIAGNOSIS — M25612 Stiffness of left shoulder, not elsewhere classified: Secondary | ICD-10-CM | POA: Insufficient documentation

## 2014-10-16 NOTE — Therapy (Signed)
[place], [place], [address] Phone: [phone]   Fax:  [phone]  Physical Therapy Treatment  Patient Details  Name: [name] MRN: [id number] Date of Birth: July 29, 1986  Encounter Date: 10/16/2014      PT End of Session - 10/16/14 1158    Visit Number 4   Number of Visits 9   Date for PT Re-Evaluation 10/30/14   PT Start Time 1115   PT Stop Time 1155   PT Time Calculation (min) 40 min   Activity Tolerance Patient tolerated treatment well   Behavior During Therapy [facility] for tasks assessed/performed      Past Medical History  Diagnosis Date  . Cancer     left breast  . Malignant neoplasm of breast (female), unspecified site   . Anemia     Past Surgical History  Procedure Laterality Date  . Cesarean section    . Left breast biopsy    . Breast lumpectomy Left 07/10/2014    Procedure: LEFT BREAST LUMPECTOMY;  Surgeon: Merrie Roof, MD;  Location: [place];  Service: General;  Laterality: Left;  . Portacath placement Right 09/06/2014    Procedure: INSERTION PORT-A-CATH;  Surgeon: Autumn Messing III, MD;  Location: [place];  Service: General;  Laterality: Right;  . Mastectomy w/ sentinel node biopsy Left 09/06/2014    Procedure: LEFT MASTECTOMY WITH SENTINEL LYMPH NODE BIOPSY/NODE MAPPING;  Surgeon: Autumn Messing III, MD;  Location: [place];  Service: General;  Laterality: Left;    LMP 10/11/2014  Visit Diagnosis:  Decreased range of motion of left shoulder  History of left breast cancer , known active  Stiffness of joint, shoulder region, left  Pain in shoulder region, left  Status post mastectomy, left      Subjective Assessment - 10/16/14 1120    Symptoms She fells likesshe she is doing well.  She reports she is doing home exercise 3 times a day.   Pertinent History Mastectomy 09/06/14 on left.   Patient Stated Goals Raise left arm up   Currently in Pain? Yes   Pain  Score 6    Pain Location Arm   Pain Orientation Left;Upper;Medial   Pain Descriptors / Indicators Sore   Pain Type Acute pain   Pain Onset 1 to 4 weeks ago   Pain Frequency Intermittent   Aggravating Factors  when arm is touched or stretched across elbow and wrist, with passive range of motion            [facility] Adult PT Treatment/Exercise - 10/16/14 1234    Neck Exercises: Stretches   Neck Stretch 3 reps   Lower Cervical/Upper Thoracic Stretch 3 reps   Other Neck Stretches neural glide with and without neck rotation   Shoulder Exercises: Supine   Protraction AROM;10 reps;Other (comment)  pain in upper arm with this active motion, no weight   Flexion AAROM  dowel   Other Supine Exercises elbow extension 2 sets of 10 with 2 #   Other Supine Exercises elbow flexion 2 sets of 10 with 2#   Shoulder Exercises: Sidelying   External Rotation AROM;Left;10 reps;Weights  2 #   Shoulder Exercises: Standing   Other Standing Exercises neural stretch against the wall   Manual Therapy   Manual Therapy Passive ROM                Plan - 10/16/14 1158    Clinical Impression Statement pt complained of pain at upper  arm and possible "guitar string" axillary webs noted in antecubital fossa. She also had tightness in left pec major muscle and is tender to touch. Interpreter present duing treatment today.  Issued Tg soft for skin  desensitizaion and to possibly help with axillary webbing   Pt will benefit from skilled therapeutic intervention in order to improve on the following deficits Increased fascial restricitons;Impaired UE functional use;Decreased range of motion;Pain   Rehab Potential Excellent   PT Frequency 2x / week   PT Duration 4 weeks   PT Treatment/Interventions Therapeutic exercise;Patient/family education;Manual techniques   PT Next Visit Plan Continue with exercise with attention to scapular moblity, manual techniques as indeicated.  Remeasure left shoulder AROM    Consulted and Agree with Plan of Care Patient                               Problem List Patient Active Problem List   Diagnosis Date Noted  . Breast cancer of lower-outer quadrant of left female breast 07/26/2014  Donato Heinz. Owens Shark, PT 10/16/2014, 12:38 PM

## 2014-10-17 ENCOUNTER — Ambulatory Visit: Payer: No Typology Code available for payment source | Admitting: Nurse Practitioner

## 2014-10-21 ENCOUNTER — Ambulatory Visit: Payer: Self-pay | Admitting: Physical Therapy

## 2014-10-23 ENCOUNTER — Ambulatory Visit: Payer: Self-pay | Admitting: Physical Therapy

## 2014-10-23 DIAGNOSIS — M25612 Stiffness of left shoulder, not elsewhere classified: Secondary | ICD-10-CM

## 2014-10-23 DIAGNOSIS — I972 Postmastectomy lymphedema syndrome: Secondary | ICD-10-CM

## 2014-10-23 DIAGNOSIS — M25512 Pain in left shoulder: Secondary | ICD-10-CM

## 2014-10-23 DIAGNOSIS — IMO0001 Reserved for inherently not codable concepts without codable children: Secondary | ICD-10-CM

## 2014-10-23 DIAGNOSIS — Z9012 Acquired absence of left breast and nipple: Secondary | ICD-10-CM

## 2014-10-24 NOTE — Therapy (Signed)
[place], [place], [address] Phone: [phone]   Fax:  [phone]  Physical Therapy Treatment  Patient Details  Name: [name] MRN: [id number] Date of Birth: 02/16/1986  Encounter Date: 10/23/2014      PT End of Session - 10/23/14 1425    Visit Number 5   Number of Visits 9   Date for PT Re-Evaluation 10/30/14   PT Start Time 1300   PT Stop Time 1345   PT Time Calculation (min) 45 min      Past Medical History  Diagnosis Date  . Cancer     left breast  . Malignant neoplasm of breast (female), unspecified site   . Anemia     Past Surgical History  Procedure Laterality Date  . Cesarean section    . Left breast biopsy    . Breast lumpectomy Left 07/10/2014    Procedure: LEFT BREAST LUMPECTOMY;  Surgeon: Merrie Roof, MD;  Location: [facility];  Service: General;  Laterality: Left;  . Portacath placement Right 09/06/2014    Procedure: INSERTION PORT-A-CATH;  Surgeon: Autumn Messing III, MD;  Location: [facility];  Service: General;  Laterality: Right;  . Mastectomy w/ sentinel node biopsy Left 09/06/2014    Procedure: LEFT MASTECTOMY WITH SENTINEL LYMPH NODE BIOPSY/NODE MAPPING;  Surgeon: Autumn Messing III, MD;  Location: [facility];  Service: General;  Laterality: Left;    LMP 10/11/2014  Visit Diagnosis:  Decreased range of motion of left shoulder  History of left breast cancer , known active  Stiffness of joint, shoulder region, left  Pain in shoulder region, left  Status post mastectomy, left  Lymphedema syndrome, postmastectomy      Subjective Assessment - 10/23/14 1304    Symptoms pt reports pain in left right chest that is swollen and goes from the front of the chest to the back.  started on Sunday  She is concerned that there may  be something more wrong.  She reports that she has been doing housework and home and her nephew was roughhousing with her. She also has a  toddler that was observed to be pulling on her arm   Currently in Pain? Yes   Pain Score 5    Pain Location Chest   Pain Orientation Left   Pain Descriptors / Indicators Pins and needles;Stabbing   Pain Type Acute pain   Pain Radiating Towards from the front toward the back and also down toward elbow   Pain Onset In the past 7 days   Pain Frequency Intermittent            Plan - 10/23/14 1425    Clinical Impression Statement Edema present in anterior chest, axilla and posterior axilla.  Interpreter present. Discussed possible use of long sleeved compression shirt, vs. compression bra and arm sleeve.  Pt to have another chemo treatment on Friday and will check in with [employer] to see if she would be eligible for their financial assist. I wil contact Rexford Maus to see if she would be able to assist with garments   PT Next Visit Plan manual lymph drainage for symptom management. continue with AROM, Work toward compression garments            [place] - 10/24/14 1806    CC Long Term Goal  #1   Title Active left shoulder flexion at least 140 degrees for improved overhead reach.   Baseline 76 degrees  Time 4   Period Weeks   Status On-going   CC Long Term Goal  #2   Title Active left shoulder abduction at least 140 degrees for improved ADLs.   Baseline 63 degrees   Time 4   Period Weeks   Status On-going   CC Long Term Goal  #3   Title Independent with home exercise program.   Baseline No knowledge   Time 4   Period Weeks   Status On-going   CC Long Term Goal  #4   Title Report pain decrease to 3/10 or less.   Baseline 5/10 at rest to 10/10 with movement of left arm.   Time 4   Period Weeks   Status On-going         Problem List Patient Active Problem List   Diagnosis Date Noted  . Breast cancer of lower-outer quadrant of left female breast 07/26/2014   Donato Heinz. Owens Shark, PT  10/24/2014, 6:07 PM

## 2014-10-25 ENCOUNTER — Other Ambulatory Visit (HOSPITAL_BASED_OUTPATIENT_CLINIC_OR_DEPARTMENT_OTHER): Payer: Self-pay

## 2014-10-25 ENCOUNTER — Telehealth: Payer: Self-pay | Admitting: Hematology and Oncology

## 2014-10-25 ENCOUNTER — Ambulatory Visit (HOSPITAL_BASED_OUTPATIENT_CLINIC_OR_DEPARTMENT_OTHER): Payer: Self-pay | Admitting: Hematology and Oncology

## 2014-10-25 ENCOUNTER — Ambulatory Visit (HOSPITAL_BASED_OUTPATIENT_CLINIC_OR_DEPARTMENT_OTHER): Payer: Self-pay

## 2014-10-25 VITALS — BP 103/55 | HR 92 | Temp 98.6°F | Resp 18 | Ht 61.0 in | Wt 110.5 lb

## 2014-10-25 DIAGNOSIS — L659 Nonscarring hair loss, unspecified: Secondary | ICD-10-CM

## 2014-10-25 DIAGNOSIS — C50512 Malignant neoplasm of lower-outer quadrant of left female breast: Secondary | ICD-10-CM

## 2014-10-25 DIAGNOSIS — N920 Excessive and frequent menstruation with regular cycle: Secondary | ICD-10-CM

## 2014-10-25 DIAGNOSIS — R1013 Epigastric pain: Secondary | ICD-10-CM

## 2014-10-25 DIAGNOSIS — K1231 Oral mucositis (ulcerative) due to antineoplastic therapy: Secondary | ICD-10-CM

## 2014-10-25 DIAGNOSIS — Z5111 Encounter for antineoplastic chemotherapy: Secondary | ICD-10-CM

## 2014-10-25 DIAGNOSIS — E876 Hypokalemia: Secondary | ICD-10-CM

## 2014-10-25 DIAGNOSIS — C773 Secondary and unspecified malignant neoplasm of axilla and upper limb lymph nodes: Secondary | ICD-10-CM

## 2014-10-25 DIAGNOSIS — Z5112 Encounter for antineoplastic immunotherapy: Secondary | ICD-10-CM

## 2014-10-25 DIAGNOSIS — M898X9 Other specified disorders of bone, unspecified site: Secondary | ICD-10-CM

## 2014-10-25 DIAGNOSIS — D6481 Anemia due to antineoplastic chemotherapy: Secondary | ICD-10-CM

## 2014-10-25 LAB — CBC WITH DIFFERENTIAL/PLATELET
BASO%: 0.8 % (ref 0.0–2.0)
BASOS ABS: 0.1 10*3/uL (ref 0.0–0.1)
EOS%: 0.2 % (ref 0.0–7.0)
Eosinophils Absolute: 0 10*3/uL (ref 0.0–0.5)
HCT: 33.3 % — ABNORMAL LOW (ref 34.8–46.6)
HEMOGLOBIN: 10.3 g/dL — AB (ref 11.6–15.9)
LYMPH#: 2.2 10*3/uL (ref 0.9–3.3)
LYMPH%: 21.9 % (ref 14.0–49.7)
MCH: 19.7 pg — ABNORMAL LOW (ref 25.1–34.0)
MCHC: 31.1 g/dL — AB (ref 31.5–36.0)
MCV: 63.5 fL — AB (ref 79.5–101.0)
MONO#: 0.6 10*3/uL (ref 0.1–0.9)
MONO%: 5.9 % (ref 0.0–14.0)
NEUT#: 7.2 10*3/uL — ABNORMAL HIGH (ref 1.5–6.5)
NEUT%: 71.2 % (ref 38.4–76.8)
Platelets: 447 10*3/uL — ABNORMAL HIGH (ref 145–400)
RBC: 5.24 10*6/uL (ref 3.70–5.45)
RDW: 16.7 % — ABNORMAL HIGH (ref 11.2–14.5)
WBC: 10.1 10*3/uL (ref 3.9–10.3)

## 2014-10-25 LAB — COMPREHENSIVE METABOLIC PANEL (CC13)
ALBUMIN: 4.2 g/dL (ref 3.5–5.0)
ALT: 20 U/L (ref 0–55)
AST: 19 U/L (ref 5–34)
Alkaline Phosphatase: 95 U/L (ref 40–150)
Anion Gap: 10 mEq/L (ref 3–11)
BILIRUBIN TOTAL: 0.36 mg/dL (ref 0.20–1.20)
BUN: 6.8 mg/dL — ABNORMAL LOW (ref 7.0–26.0)
CALCIUM: 9.4 mg/dL (ref 8.4–10.4)
CHLORIDE: 107 meq/L (ref 98–109)
CO2: 25 meq/L (ref 22–29)
Creatinine: 0.6 mg/dL (ref 0.6–1.1)
GLUCOSE: 86 mg/dL (ref 70–140)
Potassium: 3.9 mEq/L (ref 3.5–5.1)
SODIUM: 143 meq/L (ref 136–145)
TOTAL PROTEIN: 6.7 g/dL (ref 6.4–8.3)

## 2014-10-25 MED ORDER — DEXAMETHASONE SODIUM PHOSPHATE 20 MG/5ML IJ SOLN
20.0000 mg | Freq: Once | INTRAMUSCULAR | Status: AC
  Administered 2014-10-25: 20 mg via INTRAVENOUS

## 2014-10-25 MED ORDER — DIPHENHYDRAMINE HCL 25 MG PO CAPS
ORAL_CAPSULE | ORAL | Status: AC
  Filled 2014-10-25: qty 2

## 2014-10-25 MED ORDER — ACETAMINOPHEN 325 MG PO TABS
650.0000 mg | ORAL_TABLET | Freq: Once | ORAL | Status: AC
  Administered 2014-10-25: 650 mg via ORAL

## 2014-10-25 MED ORDER — SODIUM CHLORIDE 0.9 % IV SOLN
6.0000 mg/kg | Freq: Once | INTRAVENOUS | Status: AC
  Administered 2014-10-25: 315 mg via INTRAVENOUS
  Filled 2014-10-25: qty 15

## 2014-10-25 MED ORDER — SODIUM CHLORIDE 0.9 % IJ SOLN
10.0000 mL | INTRAMUSCULAR | Status: DC | PRN
  Administered 2014-10-25: 10 mL
  Filled 2014-10-25: qty 10

## 2014-10-25 MED ORDER — SODIUM CHLORIDE 0.9 % IV SOLN
660.0000 mg | Freq: Once | INTRAVENOUS | Status: AC
  Administered 2014-10-25: 660 mg via INTRAVENOUS
  Filled 2014-10-25: qty 66

## 2014-10-25 MED ORDER — DOCETAXEL CHEMO INJECTION 160 MG/16ML
75.0000 mg/m2 | Freq: Once | INTRAVENOUS | Status: AC
  Administered 2014-10-25: 110 mg via INTRAVENOUS
  Filled 2014-10-25: qty 11

## 2014-10-25 MED ORDER — PERTUZUMAB CHEMO INJECTION 420 MG/14ML
420.0000 mg | Freq: Once | INTRAVENOUS | Status: AC
  Administered 2014-10-25: 420 mg via INTRAVENOUS
  Filled 2014-10-25: qty 14

## 2014-10-25 MED ORDER — ONDANSETRON 16 MG/50ML IVPB (CHCC)
INTRAVENOUS | Status: AC
  Filled 2014-10-25: qty 16

## 2014-10-25 MED ORDER — SODIUM CHLORIDE 0.9 % IV SOLN
Freq: Once | INTRAVENOUS | Status: AC
  Administered 2014-10-25: 14:00:00 via INTRAVENOUS

## 2014-10-25 MED ORDER — HEPARIN SOD (PORK) LOCK FLUSH 100 UNIT/ML IV SOLN
500.0000 [IU] | Freq: Once | INTRAVENOUS | Status: AC | PRN
  Administered 2014-10-25: 500 [IU]
  Filled 2014-10-25: qty 5

## 2014-10-25 MED ORDER — ACETAMINOPHEN 325 MG PO TABS
ORAL_TABLET | ORAL | Status: AC
  Filled 2014-10-25: qty 2

## 2014-10-25 MED ORDER — DEXAMETHASONE SODIUM PHOSPHATE 20 MG/5ML IJ SOLN
INTRAMUSCULAR | Status: AC
  Filled 2014-10-25: qty 5

## 2014-10-25 MED ORDER — GOSERELIN ACETATE 3.6 MG ~~LOC~~ IMPL
3.6000 mg | DRUG_IMPLANT | SUBCUTANEOUS | Status: DC
  Administered 2014-10-25: 3.6 mg via SUBCUTANEOUS
  Filled 2014-10-25: qty 3.6

## 2014-10-25 MED ORDER — ONDANSETRON 16 MG/50ML IVPB (CHCC)
16.0000 mg | Freq: Once | INTRAVENOUS | Status: AC
  Administered 2014-10-25: 16 mg via INTRAVENOUS

## 2014-10-25 MED ORDER — DIPHENHYDRAMINE HCL 25 MG PO CAPS
50.0000 mg | ORAL_CAPSULE | Freq: Once | ORAL | Status: AC
  Administered 2014-10-25: 50 mg via ORAL

## 2014-10-25 NOTE — Assessment & Plan Note (Signed)
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 Today is cycle 2 day 1. Plan is for 6 cycles  Chemotherapy toxicities: 1. Chemotherapy-induced mucositis: Improved with Magic mouthwash and Valtrex 2. Dehydration 3. Bone pain related to Neulasta: Given prescription for Percocets 4. Dyspepsia treated with Protonix 5. Alopecia 6. Twice a month heavy menstrual bleeding: Will start Zoladex injections every 4 weeks. 7. Chemotherapy-induced anemia  Monitoring closely for toxicities. I reviewed her blood work from today which is adequate for treatment.  Return to clinic in 3 weeks for cycle #3

## 2014-10-25 NOTE — Patient Instructions (Signed)
Goserelin injection Qu es este medicamento? El GOSERELN es un medicamento similar a la hormona natural que se encuentra en el cuerpo. Este medicamento disminuye la cantidad de hormonas sexuales que produce el cuerpo. Los hombres tendrn niveles bajos de [place] y las mujeres tendrn niveles bajos de estrgeno mientras usan este medicamento. En los hombres, este medicamento se [place] para Lawyer de prstata, la inyeccin se administra una vez al mes o una vez cada 12 semanas. La inyeccin que se administra una vez al mes (solamente) se [place] para tratar a las mujeres con la endometriosis, sangrado uterino disfuncional o cncer de mama avanzada. Este medicamento puede ser utilizado para otros usos; si tiene alguna pregunta consulte con su proveedor de atencin mdica o con su farmacutico. MARCAS COMERCIALES DISPONIBLES: Zoladex Qu le debo informar a mi profesional de la salud antes de tomar este medicamento? Necesita saber si usted presenta alguno de los siguientes problemas o situaciones (algunos se aplican slo a mujeres): -diabetes -enfermedad cardiaca o ataque cardiaco previo -alta presin sangunea -colesterol alto -enfermedad renal -osteoporosis o baja densidad sea -problemas para orinar -lesin en la mdula espinal -derrame cerebral -fuma tabaco -una reaccin alrgica o inusual al gosereln, terapia hormonal, a otros medicamentos, alimentos, colorantes o conservantes -si est embarazada o buscando quedar embarazada -si est amamantando a un beb Cmo debo utilizar este medicamento? Este medicamento se administra mediante inyeccin por va subcutnea. Lo administra un profesional de Technical sales engineer en un hospital o en un entorno clnico. Los hombres reciben esta inyeccin cada 4 semanas o una vez cada 12 semanas. Las mujeres reciben slo las inyecciones que se administran cada 4 semanas. Hable con su pediatra para informarse acerca del uso de este medicamento en nios.  Puede requerir atencin especial. Sobredosis: Pngase en contacto inmediatamente con un centro toxicolgico o una sala de urgencia si usted cree que haya tomado demasiado medicamento. ATENCIN: [employer] es solo para usted. No comparta este medicamento con nadie. Qu sucede si me olvido de una dosis? Es importante no olvidar ninguna dosis. Informe a su mdico o a su profesional de la salud si no puede asistir a Photographer. Qu puede interactuar con este medicamento? -hormonas femeninas, como el estrgeno -suplementos a base de hierbas o dietticos, como cohosh negro, vitex o DHEA -hormonas masculinas, como la testosterona -prasterona Puede ser que esta lista no menciona todas las posibles interacciones. Informe a su profesional de [employer]	[place] de [employer] productos a base de hierbas, medicamentos de [place] o suplementos nutritivos que est tomando. Si usted fuma, consume bebidas alcohlicas o si utiliza drogas ilegales, indqueselo tambin a su profesional de [employer]	[place]. Algunas sustancias pueden interactuar con su medicamento. A qu debo estar atento al usar [employer]? Visite a su mdico o su profesional de la salud para chequear su evolucin peridicamente. Sus sntomas pueden aparecer a Engineer, manufacturing las primeras semanas de este medicamento. Si los sntomas no comienzan a mejorar o si empeoran despus de [employer], informe a su mdico o con su profesional de [employer]	[place]. El uso prolongado este medicamento puede disminuir la densidad sea. Si fuma tabaco o bebe alcohol con frecuencia puede aumentar su riesgo de prdida sea. Los antecedentes familiares de osteoporosis, el uso de medicamentos para convulsiones o de corticosteroides tambin pueden aumentar el riesgo de prdida sea. Consulte a su mdico para informarse sobre lo que puede hacer para ayudar a [employer]. Este medicamento debe detener los perodos menstruales regulares en mujeres. Si continua  a tener  perodos menstruales, informe a su mdico. Las mujeres no deben quedarse embarazadas mientras recibe [employer] o durante 12 semanas despus de dejar de usar Coca[employer]Cola. Las mujeres deben informar a su mdico si estn buscando quedar embarazadas o si creen que estn embarazadas. Existe la posibilidad de efectos secundarios graves a un beb sin nacer. Para ms informacin hable con su profesional de la salud o su farmacutico. No debe amamantar a un beb mientras recibe este medicamento. Los hombres deben informar a su mdico si quieren tener nios. Este medicamento puede reducir el conteo de esperma. Para ms informacin hable con su profesional de la salud o su farmacutico. Qu efectos secundarios puedo tener al utilizar este medicamento? Efectos secundarios que debe informar a su mdico o a Barrister's clerk de la salud tan pronto como sea posible: -Chief of Staff como erupcin cutnea, picazn o urticarias, hinchazn de la cara, labios o lengua -dolor de huesos -problemas respiratorios -cambios en la visin -dolor en el pecho -sensacin de desmayos o mareos, cadas -fiebre, escalofros -dolor, hinchazn, sensacin de calor en la pierna -dolor, hormigueo, entumecimiento de las manos o pies -signos y sntomas de baja presin sangunea, tales como mareos; sensacin de Youth worker o aturdimiento, cadas; cansancio o debilidad inusual -dolor de estmago -hinchazn de los tobillos, pies, manos -dificultad para orinar o cambios en el volumen de orina -presin sangunea inusualmente alta o baja -cansancio o debilidad inusual Efectos secundarios que, por lo general, no requieren atencin mdica (debe informarlos a su mdico o a su profesional de la salud si persisten o si son molestos): -cambios en el deseo sexual o capacidad -cambios del tamao de los pechos tanto en hombres como mujeres -cambios de emociones o de humor -dolor de Pensions consultant -sofocos -irritacin en el lugar de la  inyeccin -prdida del apetito -problemas de la piel, acn -resequedad vaginal Puede ser que esta lista no menciona todos los posibles efectos secundarios. Comunquese a su mdico por asesoramiento mdico [employer] Usted puede informar los efectos secundarios a la FDA por telfono al 1-800-FDA-1088. Dnde debo guardar mi medicina? Este medicamento se administra en hospitales o clnicas y no necesitar guardarlo en su domicilio. ATENCIN: Este folleto es un resumen. Puede ser que no cubra toda la posible informacin. Si usted tiene preguntas acerca de esta medicina, consulte con su mdico, su farmacutico o su profesional de Technical sales engineer.  2015, Elsevier/Gold Standard. (2014-02-04 17:35:04)

## 2014-10-25 NOTE — Telephone Encounter (Signed)
, °

## 2014-10-25 NOTE — Addendum Note (Signed)
Addended by: Prentiss Bells on: 10/25/2014 04:06 PM   Modules accepted: Orders

## 2014-10-25 NOTE — Progress Notes (Signed)
Patient Care Team: No Pcp Per Patient as PCP - General (General Practice)  DIAGNOSIS: Breast cancer of lower-outer quadrant of left female breast   Staging form: Breast, AJCC 7th Edition     Clinical: No stage assigned - Unsigned     Pathologic: Stage IIA (T2, N3a, cM0) - Signed by Rulon Eisenmenger, MD on 09/11/2014   SUMMARY OF ONCOLOGIC HISTORY:   Breast cancer of lower-outer quadrant of left female breast   06/06/2014 Imaging Palpable Left breast mass at 12:00 position 1.9 cm, initial biopsy 06/12/2014 revealed fibrocystic changes.   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 The genes tested were ATM, BARD1, BRCA1, BRCA2, BRIP1, CDH1, CHEK2, MRE11A, MUTYH, NBN, NF1, PALB2, PTEN, RAD50, RAD51C, RAD51D, and TP53.   10/04/2014 -  Chemotherapy Patient was started on adjuvant chemotherapy with Taxotere, Carboplatin, Herceptin, Perjeta.      CHIEF COMPLIANT: today's cycle 2 of adjuvant chemotherapy with Taxotere, carboplatin, Herceptin, Perjeta  INTERVAL HISTORY: [name] is a 28 year old Caucasian with above-mentioned history of left-sided breast cancer treated with mastectomy followed by adjuvant chemotherapy with TCH Perjeta. Today's cycle 2 of chemotherapy. After cycling once patient experienced alopecia, mucositis, dehydration. She was given IV fluids as well as treated for mucositis with Valtrex and Magic mouthwash. Her symptoms have improved. She is here today for cycle 2 of  treatment.  REVIEW OF SYSTEMS:   Constitutional: Denies fevers, chills or abnormal weight loss; alopecia Eyes: Denies blurriness of vision Ears, nose, mouth, throat, and face: Denies mucositis or sore throat Respiratory: Denies cough, dyspnea or wheezes Cardiovascular: Denies palpitation, chest discomfort or lower extremity swelling Gastrointestinal:  Denies nausea, heartburn or change in bowel habits Skin: Denies abnormal skin rashes Lymphatics: Denies new lymphadenopathy or easy bruising Neurological:Denies numbness, tingling or new weaknesses Behavioral/Psych: Mood is stable, no new changes  Breast: left breast lymphedema All other systems were reviewed with the patient and are negative.  I have reviewed the past medical history, past surgical history, social history and family history with the patient and they are unchanged from previous note.  ALLERGIES:  has No Known Allergies.  MEDICATIONS:  Current Outpatient Prescriptions  Medication Sig Dispense Refill  . dexamethasone (DECADRON) 4 MG tablet Take 2 tablets (8 mg total) by mouth 2 (two) times daily. Start the day before Taxotere. Then again the day after chemo for 3 days. 30 tablet 1  . ibuprofen (ADVIL,MOTRIN) 600 MG tablet Take 600 mg by mouth every 6 (six) hours as needed.    . lidocaine-prilocaine (EMLA) cream Apply to affected area once 30 g 3  . lidocaine-prilocaine (EMLA) cream Apply to affected area once 30 g 3  . LORazepam (ATIVAN) 0.5 MG tablet Take 1 tablet (0.5 mg total) by mouth every 6 (six) hours as needed (Nausea or vomiting). 30 tablet 0  . methocarbamol (ROBAXIN) 500 MG tablet Take 1 tablet (500 mg total) by mouth every 8 (eight) hours as needed for muscle spasms. 20 tablet 0  . Multiple Vitamin (MULTIVITAMIN WITH MINERALS) TABS tablet Take 1 tablet by mouth daily.    Marland Kitchen  ondansetron (ZOFRAN) 8 MG tablet Take 1 tablet (8 mg total) by mouth 2 (two) times daily. Start the day after chemo for 3 days. Then take as  needed for nausea or vomiting. 30 tablet 1  . oxyCODONE (OXY IR/ROXICODONE) 5 MG immediate release tablet Take 1 tablet (5 mg total) by mouth every 4 (four) hours as needed for severe pain. 30 tablet 0  . oxyCODONE-acetaminophen (PERCOCET/ROXICET) 5-325 MG per tablet Take 1 tablet by mouth every 6 (six) hours as needed for severe pain. 30 tablet 0  . pantoprazole (PROTONIX) 40 MG tablet Take 1 tablet (40 mg total) by mouth daily. 30 tablet 0  . prochlorperazine (COMPAZINE) 10 MG tablet Take 1 tablet (10 mg total) by mouth every 6 (six) hours as needed (Nausea or vomiting). 30 tablet 1  . valACYclovir (VALTREX) 500 MG tablet Take 1 tablet (500 mg total) by mouth 2 (two) times daily. 60 tablet 2   No current facility-administered medications for this visit.   Facility-Administered Medications Ordered in Other Visits  Medication Dose Route Frequency Provider Last Rate Last Dose  . CARBOplatin (PARAPLATIN) 660 mg in sodium chloride 0.9 % 250 mL chemo infusion  660 mg Intravenous Once Rulon Eisenmenger, MD      . DOCEtaxel (TAXOTERE) 110 mg in dextrose 5 % 250 mL chemo infusion  75 mg/m2 (Treatment Plan Actual) Intravenous Once Rulon Eisenmenger, MD      . heparin lock flush 100 unit/mL  500 Units Intracatheter Once PRN Rulon Eisenmenger, MD      . ondansetron (ZOFRAN) IVPB 16 mg  16 mg Intravenous Once Rulon Eisenmenger, MD   16 mg at 10/25/14 1354  . pertuzumab (PERJETA) 420 mg in sodium chloride 0.9 % 250 mL chemo infusion  420 mg Intravenous Once Rulon Eisenmenger, MD      . sodium chloride 0.9 % injection 10 mL  10 mL Intracatheter PRN Rulon Eisenmenger, MD      . trastuzumab (HERCEPTIN) 315 mg in sodium chloride 0.9 % 250 mL chemo infusion  6 mg/kg (Treatment Plan Actual) Intravenous Once Rulon Eisenmenger, MD        PHYSICAL EXAMINATION: ECOG PERFORMANCE STATUS: 1 - Symptomatic but completely ambulatory  Filed Vitals:   10/25/14 1205  BP: 103/55  Pulse: 92  Temp: 98.6 F (37 C)  Resp: 18   Filed Weights    10/25/14 1205  Weight: 110 lb 8 oz (50.122 kg)    GENERAL:alert, no distress and comfortable SKIN: skin color, texture, turgor are normal, no rashes or significant lesions EYES: normal, Conjunctiva are pink and non-injected, sclera clear OROPHARYNX:no exudate, no erythema and lips, buccal mucosa, and tongue normal  NECK: supple, thyroid normal size, non-tender, without nodularity LYMPH:  no palpable lymphadenopathy in the cervical, axillary or inguinal LUNGS: clear to auscultation and percussion with normal breathing effort HEART: regular rate & rhythm and no murmurs and no lower extremity edema ABDOMEN:abdomen soft, non-tender and normal bowel sounds Musculoskeletal:no cyanosis of digits and no clubbing  NEURO: alert & oriented x 3 with fluent speech, no focal motor/sensory deficits  LABORATORY DATA:  I have reviewed the data as listed   Chemistry      Component Value Date/Time   NA 143 10/25/2014 1144   NA 140 09/04/2014 1532   K 3.9 10/25/2014 1144   K 3.7 09/04/2014 1532   CL 105 09/04/2014 1532   CO2 25 10/25/2014 1144   CO2 25 09/04/2014 1532  BUN 6.8* 10/25/2014 1144   BUN 7 09/04/2014 1532   CREATININE 0.6 10/25/2014 1144   CREATININE 0.58 09/04/2014 1532      Component Value Date/Time   CALCIUM 9.4 10/25/2014 1144   CALCIUM 9.5 09/04/2014 1532   ALKPHOS 95 10/25/2014 1144   AST 19 10/25/2014 1144   ALT 20 10/25/2014 1144   BILITOT 0.36 10/25/2014 1144       Lab Results  Component Value Date   WBC 10.1 10/25/2014   HGB 10.3* 10/25/2014   HCT 33.3* 10/25/2014   MCV 63.5* 10/25/2014   PLT 447* 10/25/2014   NEUTROABS 7.2* 10/25/2014     ASSESSMENT & PLAN:  Breast cancer of lower-outer quadrant of left female breast 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 Positive for lymphovascular invasion, extracapsular tumor extension grade 3 ER 53%, PR 30%,  HER-2 positive ratio 5.95, Ki-67 33%, T2, N3, M0 stage IIIc with high-grade DCIS status post left mastectomy 09/06/2014  Current treatment: Adjuvant chemotherapy with Taxotere, carboplatin, Herceptin and Perjeta. Today is cycle 2 day 1. Plan is for 6 cycles  Chemotherapy toxicities: 1. Chemotherapy-induced mucositis: Improved with Magic mouthwash and Valtrex 2. Dehydration 3. Bone pain related to Neulasta: Given prescription for Percocets 4. Dyspepsia treated with Protonix 5. Alopecia 6. Twice a month heavy menstrual bleeding: Will start Zoladex injections every 4 weeks. 7. Chemotherapy-induced anemia  Monitoring closely for toxicities. I reviewed her blood work from today which is adequate for treatment.  Return to clinic in 3 weeks for cycle #3    No orders of the defined types were placed in this encounter.   The patient has a good understanding of the overall plan. she agrees with it. She will call with any problems that may develop before her next visit here.   Rulon Eisenmenger, MD 10/25/2014 1:58 PM

## 2014-10-26 ENCOUNTER — Ambulatory Visit (HOSPITAL_BASED_OUTPATIENT_CLINIC_OR_DEPARTMENT_OTHER): Payer: No Typology Code available for payment source

## 2014-10-26 DIAGNOSIS — Z5189 Encounter for other specified aftercare: Secondary | ICD-10-CM

## 2014-10-26 DIAGNOSIS — C50512 Malignant neoplasm of lower-outer quadrant of left female breast: Secondary | ICD-10-CM

## 2014-10-26 MED ORDER — PEGFILGRASTIM INJECTION 6 MG/0.6ML ~~LOC~~
6.0000 mg | PREFILLED_SYRINGE | Freq: Once | SUBCUTANEOUS | Status: AC
  Administered 2014-10-26: 6 mg via SUBCUTANEOUS

## 2014-10-28 ENCOUNTER — Ambulatory Visit: Payer: Self-pay | Admitting: Physical Therapy

## 2014-10-28 ENCOUNTER — Inpatient Hospital Stay (HOSPITAL_COMMUNITY): Payer: No Typology Code available for payment source

## 2014-10-28 ENCOUNTER — Other Ambulatory Visit: Payer: Self-pay | Admitting: *Deleted

## 2014-10-28 ENCOUNTER — Encounter (HOSPITAL_COMMUNITY): Payer: Self-pay | Admitting: *Deleted

## 2014-10-28 ENCOUNTER — Inpatient Hospital Stay (HOSPITAL_COMMUNITY)
Admit: 2014-10-28 | Discharge: 2014-10-28 | Disposition: A | Discharge status: Home / Self Care | Payer: Self-pay | Source: Ambulatory Visit | Attending: Family Medicine | Admitting: Family Medicine

## 2014-10-28 DIAGNOSIS — C50912 Malignant neoplasm of unspecified site of left female breast: Secondary | ICD-10-CM | POA: Insufficient documentation

## 2014-10-28 DIAGNOSIS — C50919 Malignant neoplasm of unspecified site of unspecified female breast: Secondary | ICD-10-CM

## 2014-10-28 DIAGNOSIS — N939 Abnormal uterine and vaginal bleeding, unspecified: Secondary | ICD-10-CM | POA: Insufficient documentation

## 2014-10-28 LAB — CBC
HEMATOCRIT: 29.2 % — AB (ref 36.0–46.0)
HEMOGLOBIN: 9.5 g/dL — AB (ref 12.0–15.0)
MCH: 20.9 pg — AB (ref 26.0–34.0)
MCHC: 32.5 g/dL (ref 30.0–36.0)
MCV: 64.2 fL — ABNORMAL LOW (ref 78.0–100.0)
Platelets: 252 10*3/uL (ref 150–400)
RBC: 4.55 MIL/uL (ref 3.87–5.11)
RDW: 17.7 % — ABNORMAL HIGH (ref 11.5–15.5)
WBC: 47.6 10*3/uL — AB (ref 4.0–10.5)

## 2014-10-28 LAB — POCT PREGNANCY, URINE: PREG TEST UR: NEGATIVE

## 2014-10-28 LAB — URINALYSIS, ROUTINE W REFLEX MICROSCOPIC
BILIRUBIN URINE: NEGATIVE
GLUCOSE, UA: NEGATIVE mg/dL
Ketones, ur: NEGATIVE mg/dL
Nitrite: NEGATIVE
Protein, ur: NEGATIVE mg/dL
Specific Gravity, Urine: 1.01 (ref 1.005–1.030)
Urobilinogen, UA: 0.2 mg/dL (ref 0.0–1.0)
pH: 7.5 (ref 5.0–8.0)

## 2014-10-28 LAB — WET PREP, GENITAL
CLUE CELLS WET PREP: NONE SEEN
Trich, Wet Prep: NONE SEEN
Yeast Wet Prep HPF POC: NONE SEEN

## 2014-10-28 LAB — URINE MICROSCOPIC-ADD ON

## 2014-10-28 MED ORDER — OXYCODONE-ACETAMINOPHEN 5-325 MG PO TABS
1.0000 | ORAL_TABLET | Freq: Once | ORAL | Status: AC
  Administered 2014-10-28: 1 via ORAL
  Filled 2014-10-28: qty 1

## 2014-10-28 NOTE — [facility] Note (Signed)
Patient is being treated for breast cancer and was sent to [facility] from the [facility] for evaluation of heavy vaginal bleeding after an injection to her abdomen to abate the bleeding failed. Denies pregnancy but has lower abdominal and back pain.

## 2014-10-28 NOTE — MAU Provider Note (Signed)
Chief Complaint: Vaginal Bleeding; Abdominal Pain; and Back Pain   First Provider Initiated Contact with Patient 10/28/14 1710     SUBJECTIVE HPI: [name] is a 28 y.o. G2P2002 who presents to maternity admissions sent from oncology office reporting menstrual-like vaginal bleeding x 2 this month, with onset of second bleed 4 days ago.  She describes the bleeding as bright red, moderate in amount, requiring pad change every 3 hours.  She is dizzy with standing and fatigued.  She had an injection of Zoladex [place] on Friday at the [facility] but was told to follow up at [facility] if bleeding did not resolve.  She denies abdominal pain, vaginal itching/burning, urinary symptoms, h/a, n/v, or fever/chills.     Past Medical History  Diagnosis Date  . Cancer     left breast  . Malignant neoplasm of breast (female), unspecified site   . Anemia    Past Surgical History  Procedure Laterality Date  . Cesarean section    . Left breast biopsy    . Breast lumpectomy Left 07/10/2014    Procedure: LEFT BREAST LUMPECTOMY;  Surgeon: Merrie Roof, MD;  Location: [place];  Service: General;  Laterality: Left;  . Portacath placement Right 09/06/2014    Procedure: INSERTION PORT-A-CATH;  Surgeon: Autumn Messing III, MD;  Location: [place];  Service: General;  Laterality: Right;  . Mastectomy w/ sentinel node biopsy Left 09/06/2014    Procedure: LEFT MASTECTOMY WITH SENTINEL LYMPH NODE BIOPSY/NODE MAPPING;  Surgeon: Autumn Messing III, MD;  Location: [place];  Service: General;  Laterality: Left;   History   Social History  . Marital Status: Married    Spouse Name: N/A    Number of Children: N/A  . Years of Education: N/A   Occupational History  . Not on file.   Social History Main Topics  . Smoking status: Never Smoker   . Smokeless tobacco: Never Used  . Alcohol Use: No  . Drug Use: No  . Sexual Activity: Yes    Birth Control/ Protection: Condom   Other Topics Concern   . Not on file   Social History Narrative   No current facility-administered medications on file prior to encounter.   Current Outpatient Prescriptions on File Prior to Encounter  Medication Sig Dispense Refill  . dexamethasone (DECADRON) 4 MG tablet Take 2 tablets (8 mg total) by mouth 2 (two) times daily. Start the day before Taxotere. Then again the day after chemo for 3 days. 30 tablet 1  . ibuprofen (ADVIL,MOTRIN) 600 MG tablet Take 600 mg by mouth every 6 (six) hours as needed.    . lidocaine-prilocaine (EMLA) cream Apply to affected area once 30 g 3  . methocarbamol (ROBAXIN) 500 MG tablet Take 1 tablet (500 mg total) by mouth every 8 (eight) hours as needed for muscle spasms. 20 tablet 0  . Multiple Vitamin (MULTIVITAMIN WITH MINERALS) TABS tablet Take 1 tablet by mouth daily.    Marland Kitchen oxyCODONE-acetaminophen (PERCOCET/ROXICET) 5-325 MG per tablet Take 1 tablet by mouth every 6 (six) hours as needed for severe pain. 30 tablet 0  . pantoprazole (PROTONIX) 40 MG tablet Take 1 tablet (40 mg total) by mouth daily. 30 tablet 0  . valACYclovir (VALTREX) 500 MG tablet Take 1 tablet (500 mg total) by mouth 2 (two) times daily. 60 tablet 2  . lidocaine-prilocaine (EMLA) cream Apply to affected area once (Patient not taking: Reported on 10/28/2014) 30 g 3  . LORazepam (ATIVAN)  0.5 MG tablet Take 1 tablet (0.5 mg total) by mouth every 6 (six) hours as needed (Nausea or vomiting). (Patient not taking: Reported on 10/28/2014) 30 tablet 0  . ondansetron (ZOFRAN) 8 MG tablet Take 1 tablet (8 mg total) by mouth 2 (two) times daily. Start the day after chemo for 3 days. Then take as needed for nausea or vomiting. (Patient not taking: Reported on 10/28/2014) 30 tablet 1  . oxyCODONE (OXY IR/ROXICODONE) 5 MG immediate release tablet Take 1 tablet (5 mg total) by mouth every 4 (four) hours as needed for severe pain. (Patient not taking: Reported on 10/28/2014) 30 tablet 0  . prochlorperazine (COMPAZINE) 10 MG  tablet Take 1 tablet (10 mg total) by mouth every 6 (six) hours as needed (Nausea or vomiting). (Patient not taking: Reported on 10/28/2014) 30 tablet 1   No Known Allergies  ROS: Pertinent items in HPI  OBJECTIVE Blood pressure 86/51, pulse 102, temperature 98.9 F (37.2 C), temperature source Oral, resp. rate 16, height 5' (1.524 m), weight 51.256 kg (113 lb), last menstrual period 10/11/2014, not currently breastfeeding. GENERAL: Well-developed, well-nourished female in no acute distress.  HEENT: Normocephalic HEART: normal rate RESP: normal effort ABDOMEN: Soft, non-tender EXTREMITIES: Nontender, no edema NEURO: Alert and oriented Pelvic exam: Cervix pink, visually closed, without lesion, small amount dark red bleeding, vaginal walls and external genitalia normal Bimanual exam: Cervix 0/long/high, firm, anterior, neg CMT, uterus mildly tender, nonenlarged, adnexa without tenderness, enlargement, or mass  LAB RESULTS Results for orders placed or performed during the hospital encounter of 10/28/14 (from the past 24 hour(s))  Urinalysis, Routine w reflex microscopic     Status: Abnormal   Collection Time: 10/28/14  4:09 PM  Result Value Ref Range   Color, Urine STRAW (A) YELLOW   APPearance CLEAR CLEAR   Specific Gravity, Urine 1.010 1.005 - 1.030   pH 7.5 5.0 - 8.0   Glucose, UA NEGATIVE NEGATIVE mg/dL   Hgb urine dipstick LARGE (A) NEGATIVE   Bilirubin Urine NEGATIVE NEGATIVE   Ketones, ur NEGATIVE NEGATIVE mg/dL   Protein, ur NEGATIVE NEGATIVE mg/dL   Urobilinogen, UA 0.2 0.0 - 1.0 mg/dL   Nitrite NEGATIVE NEGATIVE   Leukocytes, UA SMALL (A) NEGATIVE  Urine microscopic-add on     Status: Abnormal   Collection Time: 10/28/14  4:09 PM  Result Value Ref Range   Squamous Epithelial / LPF FEW (A) RARE   WBC, UA 3-6 <3 WBC/hpf   RBC / HPF 0-2 <3 RBC/hpf  Pregnancy, urine POC     Status: None   Collection Time: 10/28/14  4:42 PM  Result Value Ref Range   Preg Test, Ur  NEGATIVE NEGATIVE  CBC     Status: Abnormal   Collection Time: 10/28/14  4:54 PM  Result Value Ref Range   WBC 47.6 (H) 4.0 - 10.5 K/uL   RBC 4.55 3.87 - 5.11 MIL/uL   Hemoglobin 9.5 (L) 12.0 - 15.0 g/dL   HCT 29.2 (L) 36.0 - 46.0 %   MCV 64.2 (L) 78.0 - 100.0 fL   MCH 20.9 (L) 26.0 - 34.0 pg   MCHC 32.5 30.0 - 36.0 g/dL   RDW 17.7 (H) 11.5 - 15.5 %   Platelets 252 150 - 400 K/uL    IMAGING Pelvic ultrasound results pending  ASSESSMENT 1. Abnormal uterine bleeding (AUB)   2. Breast cancer     PLAN Consult Dr Nehemiah Settle   Pelvic U/S in MAU Discharge home F/U in clinic tomorrow.  Appt at 2:00.  Provider to contact oncologist to discuss safe treatment of AUB with pt current chemotherapy medications. Return to MAU as needed for emergencies    Medication List    ASK your doctor about these medications        dexamethasone 4 MG tablet  Commonly known as:  DECADRON  Take 2 tablets (8 mg total) by mouth 2 (two) times daily. Start the day before Taxotere. Then again the day after chemo for 3 days.     diphenhydramine-acetaminophen 25-500 MG Tabs  Commonly known as:  TYLENOL PM  Take 2 tablets by mouth at bedtime as needed.     ibuprofen 600 MG tablet  Commonly known as:  ADVIL,MOTRIN  Take 600 mg by mouth every 6 (six) hours as needed.     lidocaine-prilocaine cream  Commonly known as:  EMLA  Apply to affected area once     lidocaine-prilocaine cream  Commonly known as:  EMLA  Apply to affected area once     LORazepam 0.5 MG tablet  Commonly known as:  ATIVAN  Take 1 tablet (0.5 mg total) by mouth every 6 (six) hours as needed (Nausea or vomiting).     methocarbamol 500 MG tablet  Commonly known as:  ROBAXIN  Take 1 tablet (500 mg total) by mouth every 8 (eight) hours as needed for muscle spasms.     multivitamin with minerals Tabs tablet  Take 1 tablet by mouth daily.     ondansetron 8 MG tablet  Commonly known as:  ZOFRAN  Take 1 tablet (8 mg total) by mouth  2 (two) times daily. Start the day after chemo for 3 days. Then take as needed for nausea or vomiting.     oxyCODONE 5 MG immediate release tablet  Commonly known as:  Oxy IR/ROXICODONE  Take 1 tablet (5 mg total) by mouth every 4 (four) hours as needed for severe pain.     oxyCODONE-acetaminophen 5-325 MG per tablet  Commonly known as:  PERCOCET/ROXICET  Take 1 tablet by mouth every 6 (six) hours as needed for severe pain.     pantoprazole 40 MG tablet  Commonly known as:  PROTONIX  Take 1 tablet (40 mg total) by mouth daily.     prochlorperazine 10 MG tablet  Commonly known as:  COMPAZINE  Take 1 tablet (10 mg total) by mouth every 6 (six) hours as needed (Nausea or vomiting).     valACYclovir 500 MG tablet  Commonly known as:  VALTREX  Take 1 tablet (500 mg total) by mouth 2 (two) times daily.         Fatima Blank Certified Nurse-Midwife 10/28/2014  6:40 PM

## 2014-10-28 NOTE — Discharge Instructions (Signed)
Sangrado uterino anormal (Abnormal Uterine Bleeding) El sangrado uterino anormal puede afectar a las mujeres que estn en diversas etapas de la vida, desde adolescentes, mujeres frtiles y mujeres embarazadas, hasta mujeres que han llegado a la menopausia. Hay diversas clases de sangrado uterino que se consideran anormales, entre ellas:  Prdidas de sangre o hemorragias entre los perodos.  Hemorragias luego de mantener relaciones sexuales.  Sangrado abundante o ms que lo habitual.  Perodos que duran ms que lo normal.  Sangrado luego de la menopausia. Muchos casos de sangrado uterino anormal son leves y simples de tratar, mientras que otros son ms graves. El mdico debe evaluar cualquier clase de sangrado anormal. El tratamiento depender de la causa del sangrado. INSTRUCCIONES PARA EL CUIDADO EN EL HOGAR Controle su afeccin para ver si hay cambios. Las siguientes indicaciones ayudarn a aliviar cualquier molestia que pueda sentir:  Evite las duchas vaginales y el uso de tampones segn las indicaciones del mdico.  Cmbiese las compresas con frecuencia. Deber hacerse exmenes plvicos regulares y pruebas de Papanicolaou. Cumpla con todas las visitas de control y exmanes diagnsticos, segn le indique su mdico.  SOLICITE ATENCIN MDICA SI:   El sangrado dura ms de 1 semana.  Se siente mareada por momentos. SOLICITE ATENCIN MDICA DE INMEDIATO SI:   Se desmaya.  Debe cambiarse la compresa cada 15 a 30 minutos.  Siente dolor abdominal.  Tiene fiebre.  Se siente dbil o presenta sudoracin.  Elimina cogulos grandes por la vagina.  Comienza a sentir nuseas y vomita. ASEGRESE DE QUE:   Comprende estas instrucciones.  Controlar su afeccin.  Recibir ayuda de inmediato si no mejora o si empeora. Document Released: 11/01/2005 Document Revised: 11/06/2013 ExitCare Patient Information 2015 [employer]. This information is not intended to replace advice given  to you by your health care provider. Make sure you discuss any questions you have with your health care provider.  

## 2014-10-28 NOTE — MAU Note (Signed)
Pt states she started having vaginal bleeding on Friday and then the injection was given that day.  Pt states she has been bleeding every since.  Pt states she is needing to change her sanitary napkin every 2-3 hours with a normal size pad.  Pad not completely full.

## 2014-10-29 ENCOUNTER — Ambulatory Visit (INDEPENDENT_AMBULATORY_CARE_PROVIDER_SITE_OTHER): Payer: Self-pay | Admitting: Obstetrics and Gynecology

## 2014-10-29 ENCOUNTER — Encounter: Payer: Self-pay | Admitting: Obstetrics and Gynecology

## 2014-10-29 ENCOUNTER — Other Ambulatory Visit (HOSPITAL_COMMUNITY)
Admission: RE | Admit: 2014-10-29 | Discharge: 2014-10-29 | Disposition: A | Discharge status: Home / Self Care | Payer: MEDICAID | Source: Ambulatory Visit | Attending: Obstetrics and Gynecology | Admitting: Obstetrics and Gynecology

## 2014-10-29 VITALS — BP 93/55 | HR 98 | Temp 98.7°F | Ht 60.0 in | Wt 108.6 lb

## 2014-10-29 DIAGNOSIS — Z853 Personal history of malignant neoplasm of breast: Secondary | ICD-10-CM | POA: Insufficient documentation

## 2014-10-29 DIAGNOSIS — N939 Abnormal uterine and vaginal bleeding, unspecified: Secondary | ICD-10-CM | POA: Insufficient documentation

## 2014-10-29 DIAGNOSIS — Z01812 Encounter for preprocedural laboratory examination: Secondary | ICD-10-CM

## 2014-10-29 LAB — HIV ANTIBODY (ROUTINE TESTING W REFLEX): HIV 1&2 Ab, 4th Generation: NONREACTIVE

## 2014-10-29 LAB — GC/CHLAMYDIA PROBE AMP
CT Probe RNA: NEGATIVE
GC PROBE AMP APTIMA: NEGATIVE

## 2014-10-29 LAB — POCT PREGNANCY, URINE: Preg Test, Ur: NEGATIVE

## 2014-10-29 NOTE — Progress Notes (Signed)
Patient ID: [name], female   DOB: [birth year], 28 y.o.   MRN: [id number] 28 yo G2P2 with h/o breast cancer currently receiving Zoladex ([place] agonist) for treatment presenting today for evaluation of vaginal bleeding. Patient reports experiencing 2 menses over the course of the past 3 weeks. She had a normal 7-day period on 11/22 and started bleeding again 12/11 and is till bleeding today. Patient describes her bleeding as a normal period. She denies passage of clots or dysmenorrhea  Past Medical History  Diagnosis Date  . Cancer     left breast  . Malignant neoplasm of breast (female), unspecified site   . Anemia    Past Surgical History  Procedure Laterality Date  . Cesarean section    . Left breast biopsy    . Breast lumpectomy Left 07/10/2014    Procedure: LEFT BREAST LUMPECTOMY;  Surgeon: Merrie Roof, MD;  Location: [place];  Service: General;  Laterality: Left;  . Portacath placement Right 09/06/2014    Procedure: INSERTION PORT-A-CATH;  Surgeon: Autumn Messing III, MD;  Location: [place];  Service: General;  Laterality: Right;  . Mastectomy w/ sentinel node biopsy Left 09/06/2014    Procedure: LEFT MASTECTOMY WITH SENTINEL LYMPH NODE BIOPSY/NODE MAPPING;  Surgeon: Autumn Messing III, MD;  Location: [place];  Service: General;  Laterality: Left;   Family History  Problem Relation Age of Onset  . Diabetes Mother   . Hypertension Mother   . Diabetes Maternal Grandmother   . Hypertension Maternal Grandmother   . Stomach cancer Maternal Grandmother   . Stomach cancer Maternal Grandfather    History  Substance Use Topics  . Smoking status: Never Smoker   . Smokeless tobacco: Never Used  . Alcohol Use: No   GENERAL: Well-developed, well-nourished female in no acute distress.  ABDOMEN: Soft, nontender, nondistended. No organomegaly. PELVIC: Normal external female genitalia. Vagina is pink and rugated.  Normal discharge, small amount of blood in vaginal  vault. Normal appearing cervix. Uterus is normal in size. No adnexal mass or tenderness. EXTREMITIES: No cyanosis, clubbing, or edema, 2+ distal pulses.  12/14 Ultrasound FINDINGS: Uterus  Measurements: 7.5 by 2.8 by 4.6 cm. No fibroids or other mass visualized.  Endometrium  Thickness: 3 mm. No focal abnormality visualized.  Right ovary  Measurements: 3.0 by 1.1 by 1.5 cm. Normal appearance/no adnexal mass.  Left ovary  Measurements: 2.6 by 1.0 by 1.7 cm. 1.4 by 0.9 cm marginal cyst, follicle, or small adnexal cyst below the left ovary.  Other findings  Trace free pelvic fluid adjacent to the uterine fundus.  IMPRESSION: 1. Trace free pelvic fluid-possibly physiologic. Normal appearance the endometrium. 2. Small cyst or follicle along the inferior margin of the left Ovary.  A/P 28 yo with h/o breast cancer with abnormal uterine bleeding - Discussed the need to perform an endometrial biopsy - ENDOMETRIAL BIOPSY     The indications for endometrial biopsy were reviewed.   Risks of the biopsy including cramping, bleeding, infection, uterine perforation, inadequate specimen and need for additional procedures  were discussed. The patient states she understands and agrees to undergo procedure today. Consent was signed. Time out was performed. Urine HCG was negative. A sterile speculum was placed in the patient's vagina and the cervix was prepped with Betadine. A single-toothed tenaculum was placed on the anterior lip of the cervix to stabilize it. The uterine cavity was sounded to a depth of 8 cm using the uterine sound. The 3 mm pipelle  was introduced into the endometrial cavity without difficulty, 2 passes were made.  A  moderate amount of tissue was  sent to pathology. The instruments were removed from the patient's vagina. Minimal bleeding from the cervix was noted. The patient tolerated the procedure well.  Routine post-procedure instructions were given to the patient.  The patient will follow up in two weeks to review the results and for further management.

## 2014-10-30 ENCOUNTER — Ambulatory Visit: Payer: Self-pay | Admitting: Physical Therapy

## 2014-10-30 DIAGNOSIS — IMO0001 Reserved for inherently not codable concepts without codable children: Secondary | ICD-10-CM

## 2014-10-30 DIAGNOSIS — M25512 Pain in left shoulder: Secondary | ICD-10-CM

## 2014-10-30 DIAGNOSIS — I972 Postmastectomy lymphedema syndrome: Secondary | ICD-10-CM

## 2014-10-30 DIAGNOSIS — M25612 Stiffness of left shoulder, not elsewhere classified: Secondary | ICD-10-CM

## 2014-10-30 DIAGNOSIS — Z9012 Acquired absence of left breast and nipple: Secondary | ICD-10-CM

## 2014-10-30 NOTE — Patient Instructions (Signed)
Pt instructed to clasp hands together and raise arms overhead.

## 2014-10-30 NOTE — Therapy (Signed)
[place], [place], [address] Phone: [phone]   Fax:  [phone]  Physical Therapy Treatment  Patient Details  Name: [name] MRN: [id number] Date of Birth: [birth year]  Encounter Date: 10/30/2014      PT End of Session - 10/30/14 1237    Visit Number 6   Number of Visits 15   Date for PT Re-Evaluation 11/27/14   PT Start Time 0850   PT Stop Time 0930   PT Time Calculation (min) 40 min      Past Medical History  Diagnosis Date  . Cancer     left breast  . Malignant neoplasm of breast (female), unspecified site   . Anemia     Past Surgical History  Procedure Laterality Date  . Cesarean section    . Left breast biopsy    . Breast lumpectomy Left 07/10/2014    Procedure: LEFT BREAST LUMPECTOMY;  Surgeon: Merrie Roof, MD;  Location: [place];  Service: General;  Laterality: Left;  . Portacath placement Right 09/06/2014    Procedure: INSERTION PORT-A-CATH;  Surgeon: Autumn Messing III, MD;  Location: [place];  Service: General;  Laterality: Right;  . Mastectomy w/ sentinel node biopsy Left 09/06/2014    Procedure: LEFT MASTECTOMY WITH SENTINEL LYMPH NODE BIOPSY/NODE MAPPING;  Surgeon: Autumn Messing III, MD;  Location: [place];  Service: General;  Laterality: Left;    LMP 10/11/2014  Visit Diagnosis:  Decreased range of motion of left shoulder  History of left breast cancer , known active  Stiffness of joint, shoulder region, left  Pain in shoulder region, left  Status post mastectomy, left  Lymphedema syndrome, postmastectomy      Subjective Assessment - 10/30/14 0858    Symptoms pt report she is having swelling in her chest. It stays the same all the time, but was better for the rest of the day after the last treatment  She has not been able to find a compression tshirt that is small  enough. s   Currently in Pain? Yes   Pain Score 4    Pain Location Arm   Pain  Orientation Left   Pain Descriptors / Indicators Tightness   Pain Type Acute pain   Pain Radiating Towards down arm    Aggravating Factors  movement    Treatment: Manual lymph drainage in supine as follows: short neck, right axillary nodes, left inguinal nodes, superficial and deep abdominals; anterior inter-axillary anastamoses, left axillo-inguinal anastamoses. Left upper extremity from fingers and dorsal hand to lateral shoulder redirecting along pathways. Discussed getting 'bodyshaper" compression tank top, Reviewed AAROM exercise      [facility] PT Assessment - 10/30/14 1244    PROM   Left Shoulder Flexion 139 Degrees   Left Shoulder ABduction 118 Degrees            PT Education - 10/30/14 1237    Education provided Yes   Education Details clasp hands together and raise arms overhead   Person(s) Educated Patient   Methods Explanation;Demonstration   Comprehension Returned demonstration              Plan - 10/30/14 1240    Clinical Impression Statement Interpreter present.  pt contines with swelling in arm with decreased range of motion, especailly in left shoulder abduction.  She will check on alight and I will pursue getting sleeve through [facility]. talked with patient about getting a "bodyshaper" tank top at [facility] [place] .  Pt needs continued range of motion in abduction.   Pt will benefit from skilled therapeutic intervention in order to improve on the following deficits Increased fascial restricitons;Impaired UE functional use;Decreased range of motion;Pain;Increased edema;Decreased strength;Decreased activity tolerance   Rehab Potential Good   PT Frequency 2x / week   PT Duration 4 weeks   PT Treatment/Interventions Therapeutic exercise;Patient/family education;Manual techniques;Manual lymph drainage   PT Next Visit Plan manual lymph drainage for symptom management. continue with AROM,especially in shoulder abduction. Work toward compression garments    Consulted and Agree with Plan of Care Patient           [facility] Goals - 10/30/14 1244    CC Long Term Goal  #1   Title Active left shoulder flexion at least 140 degrees for improved overhead reach.   Time 4   Period Weeks   Status On-going   CC Long Term Goal  #2   Title Active left shoulder abduction at least 140 degrees for improved ADLs.   Time 4   Period Weeks   Status On-going   CC Long Term Goal  #3   Title Independent with home exercise program.   Time 4   Period Weeks   Status On-going   CC Long Term Goal  #4   Title Report pain decrease to 3/10 or less.   Time 4   Period Weeks   Status On-going         Problem List Patient Active Problem List   Diagnosis Date Noted  . Breast cancer of lower-outer quadrant of left female breast 07/26/2014   Donato Heinz. Owens Shark, PT  10/30/2014, 12:46 PM

## 2014-11-01 ENCOUNTER — Encounter: Payer: Self-pay | Admitting: *Deleted

## 2014-11-01 ENCOUNTER — Ambulatory Visit: Payer: Self-pay | Admitting: Physical Therapy

## 2014-11-04 ENCOUNTER — Telehealth: Payer: Self-pay | Admitting: General Practice

## 2014-11-04 NOTE — Telephone Encounter (Signed)
Called patient with [name] and informed her of results and recommendations. Patient verbalized understanding and had no questions

## 2014-11-04 NOTE — Telephone Encounter (Signed)
-----   Message from Mora Bellman, MD sent at 10/31/2014  5:16 PM EST ----- Please inform patient of negative endometrial biopsy results. Her abnormal bleeding is likely in response to the new medication and chemotherapy. Please advise patient to keep track of a menstrual calendar and to return if her bleeding becomes excessive or happens twice monthly again.   Thanks  [employer]

## 2014-11-05 ENCOUNTER — Ambulatory Visit: Payer: Self-pay

## 2014-11-12 ENCOUNTER — Encounter (HOSPITAL_COMMUNITY): Payer: Self-pay | Admitting: Emergency Medicine

## 2014-11-12 ENCOUNTER — Ambulatory Visit: Payer: No Typology Code available for payment source | Admitting: Physical Therapy

## 2014-11-12 ENCOUNTER — Emergency Department (HOSPITAL_COMMUNITY)
Admission: EM | Admit: 2014-11-12 | Discharge: 2014-11-12 | Disposition: A | Discharge status: Home / Self Care | Payer: No Typology Code available for payment source | Attending: Emergency Medicine | Admitting: Emergency Medicine

## 2014-11-12 ENCOUNTER — Emergency Department (HOSPITAL_COMMUNITY): Payer: No Typology Code available for payment source

## 2014-11-12 DIAGNOSIS — Z862 Personal history of diseases of the blood and blood-forming organs and certain disorders involving the immune mechanism: Secondary | ICD-10-CM | POA: Insufficient documentation

## 2014-11-12 DIAGNOSIS — I972 Postmastectomy lymphedema syndrome: Secondary | ICD-10-CM

## 2014-11-12 DIAGNOSIS — M25612 Stiffness of left shoulder, not elsewhere classified: Secondary | ICD-10-CM

## 2014-11-12 DIAGNOSIS — B9789 Other viral agents as the cause of diseases classified elsewhere: Secondary | ICD-10-CM

## 2014-11-12 DIAGNOSIS — R059 Cough, unspecified: Secondary | ICD-10-CM

## 2014-11-12 DIAGNOSIS — M25512 Pain in left shoulder: Secondary | ICD-10-CM

## 2014-11-12 DIAGNOSIS — Z853 Personal history of malignant neoplasm of breast: Secondary | ICD-10-CM | POA: Insufficient documentation

## 2014-11-12 DIAGNOSIS — R05 Cough: Secondary | ICD-10-CM

## 2014-11-12 DIAGNOSIS — J069 Acute upper respiratory infection, unspecified: Secondary | ICD-10-CM | POA: Insufficient documentation

## 2014-11-12 DIAGNOSIS — Z79899 Other long term (current) drug therapy: Secondary | ICD-10-CM | POA: Insufficient documentation

## 2014-11-12 NOTE — ED Notes (Signed)
Pt reports that she started dry coughing 12/24. C/o headache and chest pain with cough. Denies fever. Has not been seen by PCP. Pt currently treated at cancer center.Pt is alert , oriented and appropriate.

## 2014-11-12 NOTE — ED Provider Notes (Signed)
CSN: [id number]     Arrival date & time 11/12/14  1639 History  This chart was scribed for Veronica Bible, PA-C with [name], MD by Edison Simon, ED Scribe. This patient was seen in room WTR5/WTR5 and the patient's care was started at 5:36 PM.    Chief Complaint  Patient presents with  . Cough    dry cough  . Sore Throat  . Headache  . Cancer    portacath in place   The history is provided by the patient and a relative. No language interpreter was used.    HPI Comments: [name] is a 28 y.o. female with history of Breast Cancer currently receiving chemotherapy who presents to the Emergency Department complaining of cough with onset 5 days ago, worsening gradually.   She has associated sore throat and headache. She states the headache is intermittent and is worse at night. She reports fever measured at 100 4 days ago that has resolved. She states the cough keeps her awake at night. She notes contact with her sister who was sick. She states she had an antibiotic called in which she started taking yesterday; she is not able to name the antibiotic. She states she is not taking any other medications. She notes she has been receiving chemotherapy every 3 weeks, her next appointment is on 1/4. She denies SOB, chest pain, hemoptysis, LE edema, nausea, vomiting, or vision changes.  Past Medical History  Diagnosis Date  . Cancer     left breast  . Malignant neoplasm of breast (female), unspecified site   . Anemia    Past Surgical History  Procedure Laterality Date  . Cesarean section    . Left breast biopsy    . Breast lumpectomy Left 07/10/2014    Procedure: LEFT BREAST LUMPECTOMY;  Surgeon: Merrie Roof, MD;  Location: [facility];  Service: General;  Laterality: Left;  . Portacath placement Right 09/06/2014    Procedure: INSERTION PORT-A-CATH;  Surgeon: Autumn Messing III, MD;  Location: [facility];  Service: General;  Laterality: Right;  . Mastectomy w/  sentinel node biopsy Left 09/06/2014    Procedure: LEFT MASTECTOMY WITH SENTINEL LYMPH NODE BIOPSY/NODE MAPPING;  Surgeon: Autumn Messing III, MD;  Location: [facility];  Service: General;  Laterality: Left;   Family History  Problem Relation Age of Onset  . Diabetes Mother   . Hypertension Mother   . Diabetes Maternal Grandmother   . Hypertension Maternal Grandmother   . Stomach cancer Maternal Grandmother   . Stomach cancer Maternal Grandfather    History  Substance Use Topics  . Smoking status: Never Smoker   . Smokeless tobacco: Never Used  . Alcohol Use: No   OB History    Gravida Para Term Preterm AB TAB SAB Ectopic Multiple Living   2 2 2       2      Review of Systems  HENT: Positive for sore throat.   Eyes: Negative for visual disturbance.  Respiratory: Positive for cough. Negative for shortness of breath.   Gastrointestinal: Negative for nausea and vomiting.  Neurological: Positive for headaches.  Psychiatric/Behavioral: Positive for sleep disturbance.      Allergies  Review of patient's allergies indicates no known allergies.  Home Medications   Prior to Admission medications   Medication Sig Start Date End Date Taking? Authorizing Provider  dexamethasone (DECADRON) 4 MG tablet Take 2 tablets (8 mg total) by mouth 2 (two) times daily. Start the day before  Taxotere. Then again the day after chemo for 3 days. Patient not taking: Reported on 10/29/2014 10/03/14   Rulon Eisenmenger, MD  diphenhydramine-acetaminophen (TYLENOL PM) 25-500 MG TABS Take 2 tablets by mouth at bedtime as needed.    Historical Provider, MD  ibuprofen (ADVIL,MOTRIN) 600 MG tablet Take 600 mg by mouth every 6 (six) hours as needed.    Historical Provider, MD  lidocaine-prilocaine (EMLA) cream Apply to affected area once 09/25/14   Rulon Eisenmenger, MD  LORazepam (ATIVAN) 0.5 MG tablet Take 0.5 mg by mouth every 6 (six) hours as needed for anxiety.    Historical Provider, MD  methocarbamol (ROBAXIN) 500 MG  tablet Take 1 tablet (500 mg total) by mouth every 8 (eight) hours as needed for muscle spasms. Patient not taking: Reported on 10/29/2014 09/08/14   Rolm Bookbinder, MD  Multiple Vitamin (MULTIVITAMIN WITH MINERALS) TABS tablet Take 1 tablet by mouth daily.    Historical Provider, MD  ondansetron (ZOFRAN) 8 MG tablet Take by mouth every 8 (eight) hours as needed for nausea or vomiting.    Historical Provider, MD  oxyCODONE-acetaminophen (PERCOCET/ROXICET) 5-325 MG per tablet Take 1 tablet by mouth every 6 (six) hours as needed for severe pain. 10/11/14   Marcelino Duster, NP  pantoprazole (PROTONIX) 40 MG tablet Take 1 tablet (40 mg total) by mouth daily. 10/11/14   Minette Headland, NP  prochlorperazine (COMPAZINE) 10 MG tablet Take 10 mg by mouth every 6 (six) hours as needed for nausea or vomiting.    Historical Provider, MD  valACYclovir (VALTREX) 500 MG tablet Take 1 tablet (500 mg total) by mouth 2 (two) times daily. 10/11/14   Minette Headland, NP   BP 91/58 mmHg  Pulse 88  Temp(Src) 98.2 F (36.8 C) (Oral)  Resp 20  SpO2 99%  LMP  Physical Exam  Constitutional: She is oriented to person, place, and time. She appears well-developed and well-nourished.  HENT:  Head: Normocephalic and atraumatic.  Right Ear: Tympanic membrane and ear canal normal.  Left Ear: Tympanic membrane and ear canal normal.  Nose: Right sinus exhibits frontal sinus tenderness. Left sinus exhibits frontal sinus tenderness.  Mouth/Throat: Oropharynx is clear and moist.  Eyes: Conjunctivae and EOM are normal. Pupils are equal, round, and reactive to light.  Neck: Normal range of motion. Neck supple.  Cardiovascular: Normal rate and regular rhythm.   No murmur heard. Pulmonary/Chest: Effort normal and breath sounds normal. No respiratory distress. She has no wheezes. She has no rales.  Musculoskeletal: Normal range of motion.  Neurological: She is alert and oriented to person, place, and time. She has  normal strength. No cranial nerve deficit or sensory deficit. Coordination and gait normal.  Muscle strength normal  Skin: Skin is warm and dry.  Psychiatric: She has a normal mood and affect.  Nursing note and vitals reviewed.   ED Course  Procedures (including critical care time)  DIAGNOSTIC STUDIES: Oxygen Saturation is 99% on room air, normal by my interpretation.    COORDINATION OF CARE: 5:43 PM Discussed treatment plan with patient at beside, the patient agrees with the plan and has no further questions at this time.   Labs Review Labs Reviewed - No data to display  Imaging Review Dg Chest 2 View  11/12/2014   CLINICAL DATA:  Cough for 5 days. History of breast cancer on chemotherapy.  EXAM: CHEST  2 VIEW  COMPARISON:  PET-CT 10/11/2014  FINDINGS: Right chest port in place, tip at  the atrial caval junction. The cardiomediastinal contours are normal. The lungs are clear. Pulmonary vasculature is normal. No consolidation, pleural effusion, or pneumothorax. Surgical clips noted in the left axilla. No acute osseous abnormalities are seen.  IMPRESSION: No acute pulmonary process.  No evidence of pneumonia.   Electronically Signed   By: Jeb Levering M.D.   On: 11/12/2014 18:29     EKG Interpretation None      MDM   Final diagnoses:  Cough   Patient with a history of Cancer currently undergoing Chemotherapy presents today with a cough.  She denies chest pain, SOB, or hemoptysis.  She is afebrile.  VSS.  She is slightly hypotensive, but this appears to be baseline for her when chart was reviewed.  Pulse ox 99 on RA.  Non toxic appearing.  CXR is negative.  Feel that the patient is stable for discharge.  Instructed to follow up with PCP.  Strict return precautions given.     Veronica Bible, PA-C 11/13/14 [place], PA-C 11/13/14 [place], PA-C 11/13/14 [place], MD 11/14/14 [phone]

## 2014-11-12 NOTE — Therapy (Signed)
[place], [place], [address] Phone: [phone]   Fax:  [phone]  Physical Therapy Treatment  Patient Details  Name: [name] MRN: [id number] Date of Birth: 18-Aug-1986  Encounter Date: 11/12/2014      PT End of Session - 11/12/14 1701    Visit Number 7   Number of Visits 15   Date for PT Re-Evaluation 11/27/14   PT Start Time 1440   PT Stop Time 1520   PT Time Calculation (min) 40 min   Activity Tolerance Patient limited by pain      Past Medical History  Diagnosis Date  . Cancer     left breast  . Malignant neoplasm of breast (female), unspecified site   . Anemia     Past Surgical History  Procedure Laterality Date  . Cesarean section    . Left breast biopsy    . Breast lumpectomy Left 07/10/2014    Procedure: LEFT BREAST LUMPECTOMY;  Surgeon: Merrie Roof, MD;  Location: [facility];  Service: General;  Laterality: Left;  . Portacath placement Right 09/06/2014    Procedure: INSERTION PORT-A-CATH;  Surgeon: Autumn Messing III, MD;  Location: [facility];  Service: General;  Laterality: Right;  . Mastectomy w/ sentinel node biopsy Left 09/06/2014    Procedure: LEFT MASTECTOMY WITH SENTINEL LYMPH NODE BIOPSY/NODE MAPPING;  Surgeon: Autumn Messing III, MD;  Location: [facility];  Service: General;  Laterality: Left;    There were no vitals taken for this visit.  Visit Diagnosis:  Decreased range of motion of left shoulder  Stiffness of joint, shoulder region, left  Pain in shoulder region, left  Lymphedema syndrome, postmastectomy      Subjective Assessment - 11/12/14 1440    Symptoms Got a compression sleeve but now the hand swells some; I'm not sure why.  Got a sleeve on [place], but somebody came to measure me anyway.   Currently in Pain? Yes   Pain Score 5    Pain Location Chest   Pain Orientation Left   Aggravating Factors  nothing   Pain Relieving  Factors nothing          [facility] PT Assessment - 11/12/14 0001    AROM   Left Shoulder Flexion 134 Degrees   Left Shoulder ABduction 103 Degrees           LYMPHEDEMA/ONCOLOGY QUESTIONNAIRE - 11/12/14 1449    Left Upper Extremity Lymphedema   10 cm Proximal to Olecranon Process 24.2 cm   Olecranon Process 21.6 cm   10 cm Proximal to Ulnar Styloid Process 20.1 cm   Just Proximal to Ulnar Styloid Process 14.2 cm   Across Hand at [employer] 17.5 cm   At [place] of 2nd Digit 5.9 cm               [facility] Adult PT Treatment/Exercise - 11/12/14 0001    Manual Therapy   Manual Therapy Manual Lymphatic Drainage (MLD);Passive ROM   Manual Lymphatic Drainage (MLD) Manual lymph drainage including short neck, right axilla and anterior interaxillary anastomosis, left groin and axillo-inguinal anastomosis, and left UE from dorsal hand to shoulder.   Passive ROM Left shoulder PROM with stretch to tolerance into ER, abduction, and flexion; gentle left UE myofascial pulling into abduction.     Also:  1/2 inch foam pieces were cut and placed in stockinette, one to fit at left anterolateral chest around to anterior axilla and one at  posterior axilla to be worn between skin and bra to provide compression to areas where pt. perceives swelling.           PT Education - 11/12/14 1700    Education provided Yes   Education Details using 1/2 inch foam pads between bra and skin at left chest and axilla for compression of areas that feel swollen   Person(s) Educated Patient;Other (comment)  interpreter assisted   Methods Explanation;Verbal cues   Comprehension Verbalized understanding                [facility] Goals - 11/12/14 1705    CC Long Term Goal  #1   Status On-going   CC Long Term Goal  #2   Status On-going   CC Long Term Goal  #3   Status On-going   CC Long Term Goal  #4   Status On-going            Plan - 11/12/14 1701    Clinical Impression  Statement Pt. now has a compression sleeve but feel her left hand swells when she wears it.  Circumference measurements taken of left arm and hand today do not show increases.  Left shoulder AROM has improved, but remains below normal.  Pt. continues to perceive swelling at left chest and axilla.   Pt will benefit from skilled therapeutic intervention in order to improve on the following deficits Decreased range of motion;Increased edema;Decreased strength;Impaired perceived functional ability;Pain   Rehab Potential Good   Clinical Impairments Affecting Rehab Potential currently undergoing chemotherapy   PT Frequency 2x / week   PT Duration 4 weeks   PT Treatment/Interventions Therapeutic exercise;Patient/family education;Manual techniques;Manual lymph drainage   PT Next Visit Plan Left shoulder P/AA/AROM; manual lymph drainage; assess benefit of foam for compression at chest and axilla; explore glove option   Consulted and Agree with Plan of Care Patient        Problem List Patient Active Problem List   Diagnosis Date Noted  . Breast cancer of lower-outer quadrant of left female breast 07/26/2014    Sylvio Weatherall 11/12/2014, 5:06 PM  [place], [place], [address] Phone: [phone]   Fax:  [phone]  Serafina Royals, [place]

## 2014-11-18 ENCOUNTER — Ambulatory Visit (HOSPITAL_BASED_OUTPATIENT_CLINIC_OR_DEPARTMENT_OTHER): Payer: Self-pay | Admitting: Hematology and Oncology

## 2014-11-18 ENCOUNTER — Telehealth: Payer: Self-pay | Admitting: Hematology and Oncology

## 2014-11-18 ENCOUNTER — Telehealth: Payer: Self-pay | Admitting: *Deleted

## 2014-11-18 ENCOUNTER — Other Ambulatory Visit: Payer: Self-pay | Admitting: *Deleted

## 2014-11-18 ENCOUNTER — Other Ambulatory Visit (HOSPITAL_BASED_OUTPATIENT_CLINIC_OR_DEPARTMENT_OTHER): Payer: Self-pay

## 2014-11-18 ENCOUNTER — Ambulatory Visit (HOSPITAL_BASED_OUTPATIENT_CLINIC_OR_DEPARTMENT_OTHER): Payer: Self-pay

## 2014-11-18 DIAGNOSIS — C50312 Malignant neoplasm of lower-inner quadrant of left female breast: Secondary | ICD-10-CM

## 2014-11-18 DIAGNOSIS — C50512 Malignant neoplasm of lower-outer quadrant of left female breast: Secondary | ICD-10-CM

## 2014-11-18 DIAGNOSIS — R110 Nausea: Secondary | ICD-10-CM

## 2014-11-18 DIAGNOSIS — Z5112 Encounter for antineoplastic immunotherapy: Secondary | ICD-10-CM

## 2014-11-18 DIAGNOSIS — Z5111 Encounter for antineoplastic chemotherapy: Secondary | ICD-10-CM

## 2014-11-18 DIAGNOSIS — D6481 Anemia due to antineoplastic chemotherapy: Secondary | ICD-10-CM

## 2014-11-18 DIAGNOSIS — K1231 Oral mucositis (ulcerative) due to antineoplastic therapy: Secondary | ICD-10-CM

## 2014-11-18 DIAGNOSIS — L959 Vasculitis limited to the skin, unspecified: Secondary | ICD-10-CM

## 2014-11-18 DIAGNOSIS — R1013 Epigastric pain: Secondary | ICD-10-CM

## 2014-11-18 DIAGNOSIS — E860 Dehydration: Secondary | ICD-10-CM

## 2014-11-18 DIAGNOSIS — M899 Disorder of bone, unspecified: Secondary | ICD-10-CM

## 2014-11-18 LAB — CBC WITH DIFFERENTIAL/PLATELET
BASO%: 0.6 % (ref 0.0–2.0)
Basophils Absolute: 0 10*3/uL (ref 0.0–0.1)
EOS ABS: 0.1 10*3/uL (ref 0.0–0.5)
EOS%: 1.2 % (ref 0.0–7.0)
HCT: 30.5 % — ABNORMAL LOW (ref 34.8–46.6)
HGB: 9.6 g/dL — ABNORMAL LOW (ref 11.6–15.9)
LYMPH%: 26.8 % (ref 14.0–49.7)
MCH: 20.5 pg — ABNORMAL LOW (ref 25.1–34.0)
MCHC: 31.5 g/dL (ref 31.5–36.0)
MCV: 65 fL — ABNORMAL LOW (ref 79.5–101.0)
MONO#: 0.6 10*3/uL (ref 0.1–0.9)
MONO%: 8.6 % (ref 0.0–14.0)
NEUT%: 62.8 % (ref 38.4–76.8)
NEUTROS ABS: 4.1 10*3/uL (ref 1.5–6.5)
NRBC: 0 % (ref 0–0)
Platelets: 283 10*3/uL (ref 145–400)
RBC: 4.69 10*6/uL (ref 3.70–5.45)
RDW: 19.8 % — AB (ref 11.2–14.5)
WBC: 6.5 10*3/uL (ref 3.9–10.3)
lymph#: 1.8 10*3/uL (ref 0.9–3.3)

## 2014-11-18 LAB — COMPREHENSIVE METABOLIC PANEL (CC13)
ALT: 18 U/L (ref 0–55)
ANION GAP: 7 meq/L (ref 3–11)
AST: 19 U/L (ref 5–34)
Albumin: 4 g/dL (ref 3.5–5.0)
Alkaline Phosphatase: 97 U/L (ref 40–150)
BUN: 6.4 mg/dL — ABNORMAL LOW (ref 7.0–26.0)
CHLORIDE: 108 meq/L (ref 98–109)
CO2: 28 meq/L (ref 22–29)
Calcium: 9.2 mg/dL (ref 8.4–10.4)
Creatinine: 0.7 mg/dL (ref 0.6–1.1)
Glucose: 69 mg/dl — ABNORMAL LOW (ref 70–140)
POTASSIUM: 4 meq/L (ref 3.5–5.1)
SODIUM: 143 meq/L (ref 136–145)
Total Bilirubin: 0.48 mg/dL (ref 0.20–1.20)
Total Protein: 6.6 g/dL (ref 6.4–8.3)

## 2014-11-18 LAB — TECHNOLOGIST REVIEW

## 2014-11-18 MED ORDER — DEXAMETHASONE SODIUM PHOSPHATE 20 MG/5ML IJ SOLN
INTRAMUSCULAR | Status: AC
  Filled 2014-11-18: qty 5

## 2014-11-18 MED ORDER — DEXAMETHASONE SODIUM PHOSPHATE 20 MG/5ML IJ SOLN
20.0000 mg | Freq: Once | INTRAMUSCULAR | Status: AC
  Administered 2014-11-18: 20 mg via INTRAVENOUS

## 2014-11-18 MED ORDER — CARBOPLATIN CHEMO INJECTION 600 MG/60ML
660.0000 mg | Freq: Once | INTRAVENOUS | Status: AC
  Administered 2014-11-18: 660 mg via INTRAVENOUS
  Filled 2014-11-18: qty 66

## 2014-11-18 MED ORDER — HEPARIN SOD (PORK) LOCK FLUSH 100 UNIT/ML IV SOLN
500.0000 [IU] | Freq: Once | INTRAVENOUS | Status: AC | PRN
  Administered 2014-11-18: 500 [IU]
  Filled 2014-11-18: qty 5

## 2014-11-18 MED ORDER — SODIUM CHLORIDE 0.9 % IV SOLN
420.0000 mg | Freq: Once | INTRAVENOUS | Status: AC
  Administered 2014-11-18: 420 mg via INTRAVENOUS
  Filled 2014-11-18: qty 14

## 2014-11-18 MED ORDER — PALONOSETRON HCL INJECTION 0.25 MG/5ML
0.2500 mg | Freq: Once | INTRAVENOUS | Status: AC
  Administered 2014-11-18: 0.25 mg via INTRAVENOUS

## 2014-11-18 MED ORDER — ACETAMINOPHEN 325 MG PO TABS
ORAL_TABLET | ORAL | Status: AC
  Filled 2014-11-18: qty 2

## 2014-11-18 MED ORDER — ACETAMINOPHEN 325 MG PO TABS
650.0000 mg | ORAL_TABLET | Freq: Once | ORAL | Status: AC
  Administered 2014-11-18: 650 mg via ORAL

## 2014-11-18 MED ORDER — TRASTUZUMAB CHEMO INJECTION 440 MG
6.0000 mg/kg | Freq: Once | INTRAVENOUS | Status: AC
  Administered 2014-11-18: 315 mg via INTRAVENOUS
  Filled 2014-11-18: qty 15

## 2014-11-18 MED ORDER — DIPHENHYDRAMINE HCL 25 MG PO CAPS
ORAL_CAPSULE | ORAL | Status: AC
  Filled 2014-11-18: qty 2

## 2014-11-18 MED ORDER — PALONOSETRON HCL INJECTION 0.25 MG/5ML
INTRAVENOUS | Status: AC
  Filled 2014-11-18: qty 5

## 2014-11-18 MED ORDER — DOCETAXEL CHEMO INJECTION 160 MG/16ML
75.0000 mg/m2 | Freq: Once | INTRAVENOUS | Status: AC
  Administered 2014-11-18: 110 mg via INTRAVENOUS
  Filled 2014-11-18: qty 11

## 2014-11-18 MED ORDER — DIPHENHYDRAMINE HCL 25 MG PO CAPS
50.0000 mg | ORAL_CAPSULE | Freq: Once | ORAL | Status: AC
  Administered 2014-11-18: 50 mg via ORAL

## 2014-11-18 MED ORDER — SODIUM CHLORIDE 0.9 % IV SOLN
Freq: Once | INTRAVENOUS | Status: AC
  Administered 2014-11-18: 13:00:00 via INTRAVENOUS

## 2014-11-18 MED ORDER — SODIUM CHLORIDE 0.9 % IJ SOLN
10.0000 mL | INTRAMUSCULAR | Status: DC | PRN
  Administered 2014-11-18: 10 mL
  Filled 2014-11-18: qty 10

## 2014-11-18 MED ORDER — MAGIC MOUTHWASH: 5.0000 mL | Freq: Four times a day (QID) | ORAL | Status: DC | PRN

## 2014-11-18 NOTE — Assessment & Plan Note (Signed)
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 Today is cycle 3 day 1. Plan is for 6 cycles  Chemotherapy toxicities: 1. Chemotherapy-induced mucositis: Improved with Magic mouthwash and Valtrex 2. Dehydration 3. Bone pain related to Neulasta: Given prescription for Percocets 4. Dyspepsia treated with Protonix 5. Alopecia 6. Twice a month heavy menstrual bleeding: The bleeding symptoms have stopped and hence she would like to discontinue Zoladex treatments. 7. Chemotherapy-induced anemia 8. Chemotherapy-induced nausea: Since Zofran was causing her headaches, she is unable to take the Zofran. She also reports Zofran does not relieve her symptoms. According to her Decadron is being most affected. I discussed with her the Decadron is mostly prophylactic and does not treat symptoms. From this cycle I will change her from IV Zofran to Aloxi as premedication. Monitoring closely for toxicities Return to clinic in 3 weeks for cycle 4   Monitoring closely for toxicities. I reviewed her blood work from today which is adequate for treatment.  Return to clinic in 3 weeks for cycle #3

## 2014-11-18 NOTE — Patient Instructions (Signed)
Cancer Center Discharge Instructions for Patients Receiving Chemotherapy  Today you received the following chemotherapy agents Herceptin, Perjeta, Taxotere, and Carboplatin.  To help prevent nausea and vomiting after your treatment, we encourage you to take your nausea medication as prescribed.    If you develop nausea and vomiting that is not controlled by your nausea medication, call the clinic.   BELOW ARE SYMPTOMS THAT SHOULD BE REPORTED IMMEDIATELY:  *FEVER GREATER THAN 100.5 F  *CHILLS WITH OR WITHOUT FEVER  NAUSEA AND VOMITING THAT IS NOT CONTROLLED WITH YOUR NAUSEA MEDICATION  *UNUSUAL SHORTNESS OF BREATH  *UNUSUAL BRUISING OR BLEEDING  TENDERNESS IN MOUTH AND THROAT WITH OR WITHOUT PRESENCE OF ULCERS  *URINARY PROBLEMS  *BOWEL PROBLEMS  UNUSUAL RASH Items with * indicate a potential emergency and should be followed up as soon as possible.  Feel free to call the clinic you have any questions or concerns. The clinic phone number is [phone].    

## 2014-11-18 NOTE — Progress Notes (Signed)
Patient Care Team: No Pcp Per Patient as PCP - General (General Practice)  DIAGNOSIS: Breast cancer of lower-outer quadrant of left female breast   Staging form: Breast, AJCC 7th Edition     Clinical: No stage assigned - Unsigned     Pathologic: Stage IIA (T2, N3a, cM0) - Signed by Rulon Eisenmenger, MD on 09/11/2014   SUMMARY OF ONCOLOGIC HISTORY:   Breast cancer of lower-outer quadrant of left female breast   06/06/2014 Imaging Palpable Left breast mass at 12:00 position 1.9 cm, initial biopsy 06/12/2014 revealed fibrocystic changes.   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 The genes tested were ATM, BARD1, BRCA1, BRCA2, BRIP1, CDH1, CHEK2, MRE11A, MUTYH, NBN, NF1, PALB2, PTEN, RAD50, RAD51C, RAD51D, and TP53.   10/04/2014 -  Chemotherapy Patient was started on adjuvant chemotherapy with Taxotere, Carboplatin, Herceptin, Perjeta.      CHIEF COMPLIANT: Cycle 3 TCH Perjeta  INTERVAL HISTORY: [name] is a 29 year old lady with above-mentioned history of HER-2 positive breast cancer currently on adjuvant chemotherapy with TCH Perjeta. After the last cycle of chemotherapy, she had a lot of nausea and taste changes as well as mild sore and severe fatigue. She denies any fevers or chills. She has angular stomatitis. Denies any diarrhea. Did have nausea for which she tried taking Zofran but it caused her to have headaches as well as not being effective and has discontinued it. She  reports that she continue the dexamethasone might help her with nausea issues.  REVIEW OF SYSTEMS:   Constitutional: Denies fevers, chills or abnormal weight loss Eyes: Denies blurriness of vision Ears, nose, mouth, throat, and face: Denies mucositis or sore throat Respiratory: Denies cough, dyspnea or wheezes Cardiovascular: Denies palpitation, chest discomfort or lower extremity swelling Gastrointestinal:  Denies nausea, heartburn or change in bowel habits Skin: Denies abnormal skin rashes Lymphatics: Denies new lymphadenopathy or easy bruising Neurological:Denies numbness, tingling or new weaknesses Behavioral/Psych: Mood is stable, no new changes  Breast: Discomfort in the left axillary and arm related to lymphedema All other systems were reviewed with the patient and are negative.  I have reviewed the past medical history, past surgical history, social history and family history with the patient and they are unchanged from previous note.  ALLERGIES:  has No Known Allergies.  MEDICATIONS:  Current Outpatient Prescriptions  Medication Sig Dispense Refill  . dexamethasone (DECADRON) 4 MG tablet Take 2 tablets (8 mg total) by mouth 2 (two) times daily. Start the day before Taxotere. Then again the day after chemo for 3 days. 30 tablet 1  . diphenhydramine-acetaminophen (TYLENOL PM) 25-500 MG TABS Take 2 tablets by mouth at bedtime as needed.    Marland Kitchen ibuprofen (ADVIL,MOTRIN) 600 MG tablet Take 600 mg by mouth every 6 (six) hours as needed.    . lidocaine-prilocaine (EMLA) cream Apply to affected area once 30 g 3  . LORazepam (ATIVAN) 0.5 MG tablet Take 0.5 mg by mouth every 6 (six) hours as needed for anxiety.    . methocarbamol (ROBAXIN) 500 MG tablet Take 1 tablet (500 mg  total) by mouth every 8 (eight) hours as needed for muscle spasms. 20 tablet 0  . Multiple Vitamin (MULTIVITAMIN WITH MINERALS) TABS tablet Take 1 tablet by mouth daily.    . ondansetron (ZOFRAN) 8 MG tablet Take by  mouth every 8 (eight) hours as needed for nausea or vomiting.    . oxyCODONE-acetaminophen (PERCOCET/ROXICET) 5-325 MG per tablet Take 1 tablet by mouth every 6 (six) hours as needed for severe pain. 30 tablet 0  . pantoprazole (PROTONIX) 40 MG tablet Take 1 tablet (40 mg total) by mouth daily. 30 tablet 0  . prochlorperazine (COMPAZINE) 10 MG tablet Take 10 mg by mouth every 6 (six) hours as needed for nausea or vomiting.    . valACYclovir (VALTREX) 500 MG tablet Take 1 tablet (500 mg total) by mouth 2 (two) times daily. 60 tablet 2  . Alum & Mag Hydroxide-Simeth (MAGIC MOUTHWASH) SOLN Take 5 mLs by mouth 4 (four) times daily as needed for mouth pain. Swish, gargle and spit one to two teaspoonfuls every six hours as needed. May be swallowed if esophageal involvement. Shake well before using 120 mL 0   No current facility-administered medications for this visit.    PHYSICAL EXAMINATION: ECOG PERFORMANCE STATUS: 1 - Symptomatic but completely ambulatory  Filed Vitals:   11/18/14 1111  BP: 97/57  Pulse: 78  Temp: 98 F (36.7 C)  Resp: 18   Filed Weights   11/18/14 1111  Weight: 111 lb 1.6 oz (50.395 kg)    GENERAL:alert, no distress and comfortable; alopecia SKIN: skin color, texture, turgor are normal, no rashes or significant lesions EYES: normal, Conjunctiva are pink and non-injected, sclera clear OROPHARYNX:no exudate, no erythema and lips, buccal mucosa, and tongue normal  NECK: supple, thyroid normal size, non-tender, without nodularity LYMPH:  no palpable lymphadenopathy in the cervical, axillary or inguinal LUNGS: clear to auscultation and percussion with normal breathing effort HEART: regular rate & rhythm and no murmurs and no lower extremity edema ABDOMEN:abdomen soft, non-tender and normal bowel sounds Musculoskeletal:no cyanosis of digits and no clubbing  NEURO: alert & oriented x 3 with fluent speech, no focal motor/sensory deficits  LABORATORY DATA:  I have  reviewed the data as listed   Chemistry      Component Value Date/Time   NA 143 11/18/2014 1046   NA 140 09/04/2014 1532   K 4.0 11/18/2014 1046   K 3.7 09/04/2014 1532   CL 105 09/04/2014 1532   CO2 28 11/18/2014 1046   CO2 25 09/04/2014 1532   BUN 6.4* 11/18/2014 1046   BUN 7 09/04/2014 1532   CREATININE 0.7 11/18/2014 1046   CREATININE 0.58 09/04/2014 1532      Component Value Date/Time   CALCIUM 9.2 11/18/2014 1046   CALCIUM 9.5 09/04/2014 1532   ALKPHOS 97 11/18/2014 1046   AST 19 11/18/2014 1046   ALT 18 11/18/2014 1046   BILITOT 0.48 11/18/2014 1046       Lab Results  Component Value Date   WBC 6.5 11/18/2014   HGB 9.6* 11/18/2014   HCT 30.5* 11/18/2014   MCV 65.0* 11/18/2014   PLT 283 11/18/2014   NEUTROABS 4.1 11/18/2014   ASSESSMENT & PLAN:  Breast cancer of lower-outer quadrant of left female breast 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 Today is cycle 3 day 1. Plan is for 6 cycles  Chemotherapy toxicities: 1. Chemotherapy-induced mucositis: Improved with Magic mouthwash and Valtrex 2. Dehydration 3. Bone pain related to Neulasta: Given prescription for Percocets 4. Dyspepsia treated with Protonix 5. Alopecia 6. Twice a month heavy menstrual bleeding: The bleeding symptoms have stopped and hence she would like to discontinue Zoladex treatments. 7. Chemotherapy-induced anemia 8. Chemotherapy-induced nausea: Since Zofran was causing her headaches, she is unable to take the Zofran. She also reports Zofran does not relieve her symptoms. According to her  Decadron is being most affected. I discussed with her the Decadron is mostly prophylactic and does not treat symptoms. From this cycle I will change her from IV Zofran to Aloxi as premedication. Monitoring closely for toxicities Return to clinic in 3 weeks for cycle 4  Reg Sex: I instructed her husband to use a condom and not to have sex during the week of chemotherapy.  Monitoring closely for toxicities. I reviewed her blood work from today which is adequate for treatment.  Return to clinic in 3 weeks for cycle #3   Orders Placed This Encounter  Procedures  . CBC with Differential    Standing Status: Future     Number of Occurrences:      Standing Expiration Date: 11/18/2015  . Comprehensive metabolic panel (Cmet) - [facility]    Standing Status: Future     Number of Occurrences:      Standing Expiration Date: 11/18/2015   The patient has a good understanding of the overall plan. she agrees with it. She will call with any problems that may develop before her next visit here.   Gudena, Vinay K, MD 11/18/2014 12:35 PM   

## 2014-11-18 NOTE — Telephone Encounter (Signed)
Per staff phone call and POF I have schedueld appts. Scheduler advised of appts.  JMW  

## 2014-11-19 ENCOUNTER — Ambulatory Visit (HOSPITAL_BASED_OUTPATIENT_CLINIC_OR_DEPARTMENT_OTHER): Payer: Self-pay

## 2014-11-19 DIAGNOSIS — Z5189 Encounter for other specified aftercare: Secondary | ICD-10-CM

## 2014-11-19 DIAGNOSIS — C50512 Malignant neoplasm of lower-outer quadrant of left female breast: Secondary | ICD-10-CM

## 2014-11-19 MED ORDER — PEGFILGRASTIM INJECTION 6 MG/0.6ML ~~LOC~~
6.0000 mg | PREFILLED_SYRINGE | Freq: Once | SUBCUTANEOUS | Status: AC
  Administered 2014-11-19: 6 mg via SUBCUTANEOUS
  Filled 2014-11-19: qty 0.6

## 2014-11-19 NOTE — Patient Instructions (Signed)
Pegfilgrastim injection What is this medicine? PEGFILGRASTIM (peg fil GRA stim) is a long-acting granulocyte colony-stimulating factor that stimulates the growth of neutrophils, a type of white blood cell important in the body's fight against infection. It is used to reduce the incidence of fever and infection in patients with certain types of cancer who are receiving chemotherapy that affects the bone marrow. This medicine may be used for other purposes; ask your health care provider or pharmacist if you have questions. COMMON BRAND NAME(S): Neulasta What should I tell my health care provider before I take this medicine? They need to know if you have any of these conditions: -latex allergy -ongoing radiation therapy -sickle cell disease -skin reactions to acrylic adhesives (On-Body Injector only) -an unusual or allergic reaction to pegfilgrastim, filgrastim, other medicines, foods, dyes, or preservatives -pregnant or trying to get pregnant -breast-feeding How should I use this medicine? This medicine is for injection under the skin. If you get this medicine at home, you will be taught how to prepare and give the pre-filled syringe or how to use the On-body Injector. Refer to the patient Instructions for Use for detailed instructions. Use exactly as directed. Take your medicine at regular intervals. Do not take your medicine more often than directed. It is important that you put your used needles and syringes in a special sharps container. Do not put them in a trash can. If you do not have a sharps container, call your pharmacist or healthcare provider to get one. Talk to your pediatrician regarding the use of this medicine in children. Special care may be needed. Overdosage: If you think you have taken too much of this medicine contact a poison control center or emergency room at once. NOTE: This medicine is only for you. Do not share this medicine with others. What if I miss a dose? It is  important not to miss your dose. Call your doctor or health care professional if you miss your dose. If you miss a dose due to an On-body Injector failure or leakage, a new dose should be administered as soon as possible using a single prefilled syringe for manual use. What may interact with this medicine? Interactions have not been studied. Give your health care provider a list of all the medicines, herbs, non-prescription drugs, or dietary supplements you use. Also tell them if you smoke, drink alcohol, or use illegal drugs. Some items may interact with your medicine. This list may not describe all possible interactions. Give your health care provider a list of all the medicines, herbs, non-prescription drugs, or dietary supplements you use. Also tell them if you smoke, drink alcohol, or use illegal drugs. Some items may interact with your medicine. What should I watch for while using this medicine? You may need blood work done while you are taking this medicine. If you are going to need a MRI, CT scan, or other procedure, tell your doctor that you are using this medicine (On-Body Injector only). What side effects may I notice from receiving this medicine? Side effects that you should report to your doctor or health care professional as soon as possible: -allergic reactions like skin rash, itching or hives, swelling of the face, lips, or tongue -dizziness -fever -pain, redness, or irritation at site where injected -pinpoint red spots on the skin -shortness of breath or breathing problems -stomach or side pain, or pain at the shoulder -swelling -tiredness -trouble passing urine Side effects that usually do not require medical attention (report to your doctor   or health care professional if they continue or are bothersome): -bone pain -muscle pain This list may not describe all possible side effects. Call your doctor for medical advice about side effects. You may report side effects to FDA at  1-800-FDA-1088. Where should I keep my medicine? Keep out of the reach of children. Store pre-filled syringes in a refrigerator between 2 and 8 degrees C (36 and 46 degrees F). Do not freeze. Keep in carton to protect from light. Throw away this medicine if it is left out of the refrigerator for more than 48 hours. Throw away any unused medicine after the expiration date. NOTE: This sheet is a summary. It may not cover all possible information. If you have questions about this medicine, talk to your doctor, pharmacist, or health care provider.  2015, Elsevier/Gold Standard. (2014-01-31 16:14:05)  

## 2014-11-26 ENCOUNTER — Ambulatory Visit: Payer: No Typology Code available for payment source | Attending: General Surgery | Admitting: Physical Therapy

## 2014-11-26 DIAGNOSIS — Z9012 Acquired absence of left breast and nipple: Secondary | ICD-10-CM | POA: Insufficient documentation

## 2014-11-26 DIAGNOSIS — Z5189 Encounter for other specified aftercare: Secondary | ICD-10-CM | POA: Insufficient documentation

## 2014-11-26 DIAGNOSIS — M25512 Pain in left shoulder: Secondary | ICD-10-CM | POA: Insufficient documentation

## 2014-11-26 DIAGNOSIS — M25612 Stiffness of left shoulder, not elsewhere classified: Secondary | ICD-10-CM | POA: Insufficient documentation

## 2014-11-26 DIAGNOSIS — C50512 Malignant neoplasm of lower-outer quadrant of left female breast: Secondary | ICD-10-CM | POA: Insufficient documentation

## 2014-11-27 NOTE — Therapy (Signed)
[place], [place], [address] Phone: [phone]   Fax:  [phone]  Physical Therapy Treatment  Patient Details  Name: [name] MRN: [id number] Date of Birth: March 09, 1986 Referring Provider:  Jovita Kussmaul, MD  Encounter Date: 11/26/2014      PT End of Session - 11/26/14 1346    Visit Number 8   Number of Visits 9   Date for PT Re-Evaluation 11/27/14   PT Start Time 3710   PT Stop Time 1345   PT Time Calculation (min) 42 min      Past Medical History  Diagnosis Date  . Cancer     left breast  . Malignant neoplasm of breast (female), unspecified site   . Anemia     Past Surgical History  Procedure Laterality Date  . Cesarean section    . Left breast biopsy    . Breast lumpectomy Left 07/10/2014    Procedure: LEFT BREAST LUMPECTOMY;  Surgeon: Merrie Roof, MD;  Location: [facility];  Service: General;  Laterality: Left;  . Portacath placement Right 09/06/2014    Procedure: INSERTION PORT-A-CATH;  Surgeon: Autumn Messing III, MD;  Location: [facility];  Service: General;  Laterality: Right;  . Mastectomy w/ sentinel node biopsy Left 09/06/2014    Procedure: LEFT MASTECTOMY WITH SENTINEL LYMPH NODE BIOPSY/NODE MAPPING;  Surgeon: Autumn Messing III, MD;  Location: [facility];  Service: General;  Laterality: Left;    There were no vitals taken for this visit.  Visit Diagnosis:  Stiffness of joint, shoulder region, left  Pain in shoulder region, left      Subjective Assessment - 11/26/14 1305    Symptoms Nothing new.   Currently in Pain? Yes   Pain Score 4    Pain Location Chest   Pain Orientation Left                            PT Education - 11/26/14 1345    Education provided Yes   Education Details Patient asked, so was given tips about doing safe crunches without pain and with proper breathing, avoiding pulling on neck.   Person(s) Educated  Patient  Interpreter present for this part of treatment today   Methods Explanation;Verbal cues   Comprehension Verbalized understanding;Returned demonstration                [facility] Goals - 11/27/14 0856    CC Long Term Goal  #1   Status Achieved   CC Long Term Goal  #2   Status Achieved   CC Long Term Goal  #3   Status On-going   CC Long Term Goal  #4   Status On-going   CC Long Term Goal  #5   Title left shoulder active flexion to at least 160 degrees for improved overhead reach   Time 2   Period [place] - 11/27/14 6269    Clinical Impression Statement Pt. with excellent improvement in left shoulder AROM; abduction is now full, ER slightly limited, and flexion moderately limited.   Pt will benefit from skilled therapeutic intervention in order to improve on the following deficits Decreased range of motion;Pain   Rehab Potential Good   Clinical Impairments Affecting Rehab Potential currently undergoing chemotherapy   PT Frequency 2x / week   PT  Treatment/Interventions Therapeutic exercise;Patient/family education;Manual techniques;Manual lymph drainage   PT Next Visit Plan Will need to discharge next visit unless patient wants to do self-pay, since [facility] agreed to 8 treatment visits.   Consulted and Agree with Plan of Care Patient        Problem List Patient Active Problem List   Diagnosis Date Noted  . Breast cancer of lower-outer quadrant of left female breast 07/26/2014    [name],[name] 11/27/2014, 9:01 AM  [place], [place], [address] Phone: [phone]   Fax:  [phone]  Serafina Royals, [place]

## 2014-12-03 ENCOUNTER — Ambulatory Visit: Payer: No Typology Code available for payment source | Admitting: Physical Therapy

## 2014-12-03 DIAGNOSIS — M25512 Pain in left shoulder: Secondary | ICD-10-CM

## 2014-12-03 DIAGNOSIS — Z9012 Acquired absence of left breast and nipple: Secondary | ICD-10-CM

## 2014-12-03 DIAGNOSIS — M25612 Stiffness of left shoulder, not elsewhere classified: Secondary | ICD-10-CM

## 2014-12-03 DIAGNOSIS — IMO0001 Reserved for inherently not codable concepts without codable children: Secondary | ICD-10-CM

## 2014-12-03 DIAGNOSIS — I972 Postmastectomy lymphedema syndrome: Secondary | ICD-10-CM

## 2014-12-03 NOTE — Therapy (Signed)
[place], [place], [address] Phone: [phone]   Fax:  [phone]  Physical Therapy Treatment  Patient Details  Name: [name] MRN: [id number] Date of Birth: Jun 27, 1986 Referring Provider:  Jovita Kussmaul, MD  Encounter Date: 12/03/2014      PT End of Session - 12/03/14 1727    Visit Number 9   Number of Visits 9   Date for PT Re-Evaluation 11/27/14   PT Start Time 3817   PT Stop Time 1349   PT Time Calculation (min) 44 min   Activity Tolerance Patient limited by pain      Past Medical History  Diagnosis Date  . Cancer     left breast  . Malignant neoplasm of breast (female), unspecified site   . Anemia     Past Surgical History  Procedure Laterality Date  . Cesarean section    . Left breast biopsy    . Breast lumpectomy Left 07/10/2014    Procedure: LEFT BREAST LUMPECTOMY;  Surgeon: Merrie Roof, MD;  Location: [facility];  Service: General;  Laterality: Left;  . Portacath placement Right 09/06/2014    Procedure: INSERTION PORT-A-CATH;  Surgeon: Autumn Messing III, MD;  Location: [facility];  Service: General;  Laterality: Right;  . Mastectomy w/ sentinel node biopsy Left 09/06/2014    Procedure: LEFT MASTECTOMY WITH SENTINEL LYMPH NODE BIOPSY/NODE MAPPING;  Surgeon: Autumn Messing III, MD;  Location: [facility];  Service: General;  Laterality: Left;    There were no vitals taken for this visit.  Visit Diagnosis:  Stiffness of joint, shoulder region, left  Pain in shoulder region, left  History of left breast cancer , known active  Lymphedema syndrome, postmastectomy  Status post mastectomy, left      Subjective Assessment - 12/03/14 1306    Symptoms Nothing to report.   Currently in Pain? Yes   Pain Score 5    Pain Location Arm   Pain Orientation Left;Upper   Aggravating Factors  stretching arm up   Pain Relieving Factors arm at rest                     [facility] Adult PT Treatment/Exercise - 12/03/14 0001    Exercises   Exercises Lumbar   Lumbar Exercises: Supine   Other Supine Lumbar Exercises Discussed doing partial crunches safely at home.   Shoulder Exercises: Standing   Flexion AAROM  finger ladder x 5   ABduction AAROM  finger ladder 5 times   Other Standing Exercises Left shoulder abduction along with right trunk sidebend, 5 counts x 10   Shoulder Exercises: Pulleys   Flexion 2 minutes   ABduction 2 minutes   Shoulder Exercises: Therapy Ball   Flexion 10 reps   Manual Therapy   Myofascial Release Left UE myofascial pulling to tolerance.   Passive ROM Left shoulder PROM with stretch to patient tolerance, slowly and with longer holds, for ER, abduction, and flexion; also horizontal abduction.                        [facility] Goals - 12/03/14 1318    CC Long Term Goal  #1   Status Achieved   CC Long Term Goal  #2   Status Achieved   CC Long Term Goal  #3   Status Achieved   CC Long Term Goal  #4   Status Partially Met  Plan - 12/03/14 1728    Clinical Impression Statement Pt. doing well; still with some tightness in left shoulder, but functional and improved/improving ROM.  Okay for discharge.   Rehab Potential Good   PT Next Visit Plan None; discharge visit today.   Recommended Other Services Patient was to receive a compression glove today with fitting after her therapy appointment.   Consulted and Agree with Plan of Care Patient  interpreter present today        Problem List Patient Active Problem List   Diagnosis Date Noted  . Breast cancer of lower-outer quadrant of left female breast 07/26/2014    Zyrus Hetland 12/03/2014, 5:32 PM  [place], [place], [address] Phone: [phone]   Fax:  [phone]  PHYSICAL THERAPY DISCHARGE SUMMARY  Visits from  Start of Care: 9  Current functional level related to goals / functional outcomes: See above for goal status.   Remaining deficits: Mild tightness and discomfort with stretching left shoulder joint.   Education / Equipment: Home exercise program; compression sleeve and glove.  Plan: Patient agrees to discharge.  Patient goals were partially met. Patient is being discharged due to being pleased with the current functional level.  ?????       Axelle Szwed, PT

## 2014-12-09 ENCOUNTER — Ambulatory Visit (HOSPITAL_BASED_OUTPATIENT_CLINIC_OR_DEPARTMENT_OTHER): Payer: Self-pay | Admitting: Hematology and Oncology

## 2014-12-09 ENCOUNTER — Ambulatory Visit (HOSPITAL_BASED_OUTPATIENT_CLINIC_OR_DEPARTMENT_OTHER): Payer: Self-pay

## 2014-12-09 ENCOUNTER — Other Ambulatory Visit (HOSPITAL_BASED_OUTPATIENT_CLINIC_OR_DEPARTMENT_OTHER): Payer: Self-pay

## 2014-12-09 ENCOUNTER — Telehealth: Payer: Self-pay | Admitting: Hematology and Oncology

## 2014-12-09 ENCOUNTER — Other Ambulatory Visit: Payer: Self-pay | Admitting: Hematology and Oncology

## 2014-12-09 ENCOUNTER — Ambulatory Visit: Payer: No Typology Code available for payment source

## 2014-12-09 VITALS — BP 94/64 | HR 87 | Temp 98.9°F | Resp 20 | Ht 60.0 in | Wt 110.3 lb

## 2014-12-09 DIAGNOSIS — Z5111 Encounter for antineoplastic chemotherapy: Secondary | ICD-10-CM

## 2014-12-09 DIAGNOSIS — C50512 Malignant neoplasm of lower-outer quadrant of left female breast: Secondary | ICD-10-CM

## 2014-12-09 DIAGNOSIS — C773 Secondary and unspecified malignant neoplasm of axilla and upper limb lymph nodes: Secondary | ICD-10-CM

## 2014-12-09 DIAGNOSIS — Z5112 Encounter for antineoplastic immunotherapy: Secondary | ICD-10-CM

## 2014-12-09 DIAGNOSIS — D6481 Anemia due to antineoplastic chemotherapy: Secondary | ICD-10-CM

## 2014-12-09 DIAGNOSIS — R110 Nausea: Secondary | ICD-10-CM

## 2014-12-09 DIAGNOSIS — M899 Disorder of bone, unspecified: Secondary | ICD-10-CM

## 2014-12-09 DIAGNOSIS — E860 Dehydration: Secondary | ICD-10-CM

## 2014-12-09 DIAGNOSIS — L659 Nonscarring hair loss, unspecified: Secondary | ICD-10-CM

## 2014-12-09 DIAGNOSIS — N9489 Other specified conditions associated with female genital organs and menstrual cycle: Secondary | ICD-10-CM

## 2014-12-09 LAB — CBC WITH DIFFERENTIAL/PLATELET
BASO%: 0.6 % (ref 0.0–2.0)
BASOS ABS: 0 10*3/uL (ref 0.0–0.1)
EOS ABS: 0 10*3/uL (ref 0.0–0.5)
EOS%: 0.3 % (ref 0.0–7.0)
HEMATOCRIT: 30.3 % — AB (ref 34.8–46.6)
HGB: 9.6 g/dL — ABNORMAL LOW (ref 11.6–15.9)
LYMPH#: 2 10*3/uL (ref 0.9–3.3)
LYMPH%: 31.9 % (ref 14.0–49.7)
MCH: 20.8 pg — ABNORMAL LOW (ref 25.1–34.0)
MCHC: 31.7 g/dL (ref 31.5–36.0)
MCV: 65.6 fL — AB (ref 79.5–101.0)
MONO#: 0.5 10*3/uL (ref 0.1–0.9)
MONO%: 8 % (ref 0.0–14.0)
NEUT#: 3.8 10*3/uL (ref 1.5–6.5)
NEUT%: 59.2 % (ref 38.4–76.8)
NRBC: 0 % (ref 0–0)
Platelets: 162 10*3/uL (ref 145–400)
RBC: 4.62 10*6/uL (ref 3.70–5.45)
RDW: 20.1 % — ABNORMAL HIGH (ref 11.2–14.5)
WBC: 6.4 10*3/uL (ref 3.9–10.3)

## 2014-12-09 LAB — COMPREHENSIVE METABOLIC PANEL (CC13)
ALT: 14 U/L (ref 0–55)
ANION GAP: 9 meq/L (ref 3–11)
AST: 17 U/L (ref 5–34)
Albumin: 4.2 g/dL (ref 3.5–5.0)
Alkaline Phosphatase: 100 U/L (ref 40–150)
BILIRUBIN TOTAL: 0.33 mg/dL (ref 0.20–1.20)
BUN: 9.3 mg/dL (ref 7.0–26.0)
CALCIUM: 9 mg/dL (ref 8.4–10.4)
CO2: 25 meq/L (ref 22–29)
Chloride: 108 mEq/L (ref 98–109)
Creatinine: 0.6 mg/dL (ref 0.6–1.1)
EGFR: >90 mL/min/{1.73_m2} (ref >90)
Glucose: 89 mg/dl (ref 70–140)
POTASSIUM: 3.7 meq/L (ref 3.5–5.1)
Sodium: 142 mEq/L (ref 136–145)
TOTAL PROTEIN: 6.5 g/dL (ref 6.4–8.3)

## 2014-12-09 LAB — TECHNOLOGIST REVIEW

## 2014-12-09 MED ORDER — DEXAMETHASONE SODIUM PHOSPHATE 20 MG/5ML IJ SOLN
INTRAMUSCULAR | Status: AC
  Filled 2014-12-09: qty 5

## 2014-12-09 MED ORDER — TRASTUZUMAB CHEMO INJECTION 440 MG
6.0000 mg/kg | Freq: Once | INTRAVENOUS | Status: AC
  Administered 2014-12-09: 315 mg via INTRAVENOUS
  Filled 2014-12-09: qty 15

## 2014-12-09 MED ORDER — SODIUM CHLORIDE 0.9 % IJ SOLN
10.0000 mL | INTRAMUSCULAR | Status: AC | PRN
  Administered 2014-12-09 (×2): 10 mL via INTRAVENOUS
  Filled 2014-12-09: qty 10

## 2014-12-09 MED ORDER — PERTUZUMAB CHEMO INJECTION 420 MG/14ML
420.0000 mg | Freq: Once | INTRAVENOUS | Status: AC
  Administered 2014-12-09: 420 mg via INTRAVENOUS
  Filled 2014-12-09: qty 14

## 2014-12-09 MED ORDER — HEPARIN SOD (PORK) LOCK FLUSH 100 UNIT/ML IV SOLN
500.0000 [IU] | Freq: Once | INTRAVENOUS | Status: AC | PRN
  Administered 2014-12-09: 500 [IU]
  Filled 2014-12-09: qty 5

## 2014-12-09 MED ORDER — DEXAMETHASONE SODIUM PHOSPHATE 20 MG/5ML IJ SOLN
20.0000 mg | Freq: Once | INTRAMUSCULAR | Status: AC
  Administered 2014-12-09: 20 mg via INTRAVENOUS

## 2014-12-09 MED ORDER — DIPHENHYDRAMINE HCL 25 MG PO CAPS
ORAL_CAPSULE | ORAL | Status: AC
  Filled 2014-12-09: qty 2

## 2014-12-09 MED ORDER — DOCETAXEL CHEMO INJECTION 160 MG/16ML
75.0000 mg/m2 | Freq: Once | INTRAVENOUS | Status: AC
  Administered 2014-12-09: 110 mg via INTRAVENOUS
  Filled 2014-12-09: qty 11

## 2014-12-09 MED ORDER — DIPHENHYDRAMINE HCL 25 MG PO CAPS
50.0000 mg | ORAL_CAPSULE | Freq: Once | ORAL | Status: AC
  Administered 2014-12-09: 50 mg via ORAL

## 2014-12-09 MED ORDER — ACETAMINOPHEN 325 MG PO TABS
650.0000 mg | ORAL_TABLET | Freq: Once | ORAL | Status: AC
  Administered 2014-12-09: 650 mg via ORAL

## 2014-12-09 MED ORDER — SODIUM CHLORIDE 0.9 % IV SOLN
660.0000 mg | Freq: Once | INTRAVENOUS | Status: AC
  Administered 2014-12-09: 660 mg via INTRAVENOUS
  Filled 2014-12-09: qty 66

## 2014-12-09 MED ORDER — PROCHLORPERAZINE MALEATE 10 MG PO TABS: 10.0000 mg | ORAL_TABLET | Freq: Four times a day (QID) | ORAL | Status: DC | PRN

## 2014-12-09 MED ORDER — SODIUM CHLORIDE 0.9 % IV SOLN: Freq: Once | INTRAVENOUS | Status: DC

## 2014-12-09 MED ORDER — ACETAMINOPHEN 325 MG PO TABS
ORAL_TABLET | ORAL | Status: AC
  Filled 2014-12-09: qty 2

## 2014-12-09 NOTE — Assessment & Plan Note (Signed)
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 Today is cycle 4 day 1. Plan is for 6 cycles  Chemotherapy toxicities: 1. Chemotherapy-induced mucositis: Improved with Magic mouthwash and Valtrex 2. Dehydration 3. Bone pain related to Neulasta: Given prescription for Percocets 4. Dyspepsia treated with Protonix 5. Alopecia 6. Twice a month heavy menstrual bleeding: The bleeding symptoms have stopped and hence she would like to discontinue Zoladex treatments. 7. Chemotherapy-induced anemia 8. Chemotherapy-induced nausea: Since Zofran was causing her headaches, she is unable to take the Zofran. According to her Decadron is being most effective. I discussed with her the Decadron is mostly prophylactic and does not treat symptoms. Changed IV Zofran to Aloxi as premedication. Monitoring closely for toxicities Return to clinic in 3 weeks for cycle 5

## 2014-12-09 NOTE — Progress Notes (Signed)
Patient Care Team: No Pcp Per Patient as PCP - General (General Practice)  DIAGNOSIS: Breast cancer of lower-outer quadrant of left female breast   Staging form: Breast, AJCC 7th Edition     Clinical: No stage assigned - Unsigned     Pathologic: Stage IIA (T2, N3a, cM0) - Signed by Rulon Eisenmenger, MD on 09/11/2014   SUMMARY OF ONCOLOGIC HISTORY:   Breast cancer of lower-outer quadrant of left female breast   06/06/2014 Imaging Palpable Left breast mass at 12:00 position 1.9 cm, initial biopsy 06/12/2014 revealed fibrocystic changes.   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 The genes tested were ATM, BARD1, BRCA1, BRCA2, BRIP1, CDH1, CHEK2, MRE11A, MUTYH, NBN, NF1, PALB2, PTEN, RAD50, RAD51C, RAD51D, and TP53.   10/04/2014 -  Chemotherapy Patient was started on adjuvant chemotherapy with Taxotere, Carboplatin, Herceptin, Perjeta.      CHIEF COMPLIANT: Cycle 4 DC H Perjeta  INTERVAL HISTORY: [name] is a 29 year old lady with above-mentioned history of left-sided breast cancer treated with mastectomy and is now on adjuvant chemotherapy with TCH Perjeta. She had tolerated cycle #3 fairly well except for mouth sores which were treated with Magic mouthwash. Her taste is not good. She complains of pain during coitus. Did have some nausea issues.  REVIEW OF SYSTEMS:   Constitutional: Denies fevers, chills or abnormal weight loss Eyes: Denies blurriness of vision Ears, nose,  mouth, throat, and face: Denies mucositis or sore throat Respiratory: Denies cough, dyspnea or wheezes Cardiovascular: Denies palpitation, chest discomfort or lower extremity swelling Gastrointestinal:  Denies nausea, heartburn or change in bowel habits Skin: Denies abnormal skin rashes Lymphatics: Denies new lymphadenopathy or easy bruising Neurological:Denies numbness, tingling or new weaknesses Behavioral/Psych: Mood is stable, no new changes  Breast:  denies any pain or lumps or nodules in either breasts All other systems were reviewed with the patient and are negative.  I have reviewed the past medical history, past surgical history, social history and family history with the patient and they are unchanged from previous note.  ALLERGIES:  has No Known Allergies.  MEDICATIONS:  Current Outpatient Prescriptions  Medication Sig Dispense Refill  . Alum & Mag Hydroxide-Simeth (MAGIC MOUTHWASH) SOLN Take 5 mLs by mouth 4 (four) times daily as needed for mouth pain. Swish, gargle and spit one to two teaspoonfuls every six hours as needed. May be swallowed if esophageal involvement. Shake well before using 120 mL 0  . dexamethasone (DECADRON) 4 MG tablet Take 2 tablets (8 mg total) by mouth 2 (two) times daily. Start the day before Taxotere. Then again the day after chemo for 3 days. 30 tablet 1  . diphenhydramine-acetaminophen (TYLENOL PM) 25-500 MG TABS Take 2 tablets by mouth at bedtime as needed.    Marland Kitchen ibuprofen (ADVIL,MOTRIN) 600 MG tablet Take 600 mg by mouth every 6 (six) hours as needed.    . lidocaine-prilocaine (EMLA) cream Apply to affected area once 30 g 3  . LORazepam (ATIVAN) 0.5 MG tablet Take 0.5 mg by mouth every 6 (six) hours as needed for anxiety.    Marland Kitchen  methocarbamol (ROBAXIN) 500 MG tablet Take 1 tablet (500 mg total) by mouth every 8 (eight) hours as needed for muscle spasms. 20 tablet 0  . Multiple Vitamin (MULTIVITAMIN WITH MINERALS) TABS tablet Take 1 tablet by mouth  daily.    . ondansetron (ZOFRAN) 8 MG tablet Take by mouth every 8 (eight) hours as needed for nausea or vomiting.    Marland Kitchen oxyCODONE-acetaminophen (PERCOCET/ROXICET) 5-325 MG per tablet Take 1 tablet by mouth every 6 (six) hours as needed for severe pain. 30 tablet 0  . pantoprazole (PROTONIX) 40 MG tablet Take 1 tablet (40 mg total) by mouth daily. 30 tablet 0  . prochlorperazine (COMPAZINE) 10 MG tablet Take 1 tablet (10 mg total) by mouth every 6 (six) hours as needed for nausea or vomiting. 30 tablet 1  . valACYclovir (VALTREX) 500 MG tablet Take 1 tablet (500 mg total) by mouth 2 (two) times daily. 60 tablet 2   No current facility-administered medications for this visit.   Facility-Administered Medications Ordered in Other Visits  Medication Dose Route Frequency Provider Last Rate Last Dose  . sodium chloride 0.9 % injection 10 mL  10 mL Intravenous PRN Rulon Eisenmenger, MD   10 mL at 12/09/14 1054    PHYSICAL EXAMINATION: ECOG PERFORMANCE STATUS: 1 - Symptomatic but completely ambulatory  Filed Vitals:   12/09/14 1121  BP: 94/64  Pulse: 87  Temp: 98.9 F (37.2 C)  Resp: 20   Filed Weights   12/09/14 1121  Weight: 110 lb 4.8 oz (50.032 kg)    GENERAL:alert, no distress and comfortable SKIN: skin color, texture, turgor are normal, no rashes or significant lesions EYES: normal, Conjunctiva are pink and non-injected, sclera clear OROPHARYNX:no exudate, no erythema and lips, buccal mucosa, and tongue normal  NECK: supple, thyroid normal size, non-tender, without nodularity LYMPH:  no palpable lymphadenopathy in the cervical, axillary or inguinal LUNGS: clear to auscultation and percussion with normal breathing effort HEART: regular rate & rhythm and no murmurs and no lower extremity edema ABDOMEN:abdomen soft, non-tender and normal bowel sounds Musculoskeletal:no cyanosis of digits and no clubbing  NEURO: alert & oriented x 3 with fluent speech, no focal motor/sensory  deficits   LABORATORY DATA:  I have reviewed the data as listed   Chemistry      Component Value Date/Time   NA 142 12/09/2014 1048   NA 140 09/04/2014 1532   K 3.7 12/09/2014 1048   K 3.7 09/04/2014 1532   CL 105 09/04/2014 1532   CO2 25 12/09/2014 1048   CO2 25 09/04/2014 1532   BUN 9.3 12/09/2014 1048   BUN 7 09/04/2014 1532   CREATININE 0.6 12/09/2014 1048   CREATININE 0.58 09/04/2014 1532      Component Value Date/Time   CALCIUM 9.0 12/09/2014 1048   CALCIUM 9.5 09/04/2014 1532   ALKPHOS 100 12/09/2014 1048   AST 17 12/09/2014 1048   ALT 14 12/09/2014 1048   BILITOT 0.33 12/09/2014 1048       Lab Results  Component Value Date   WBC 6.4 12/09/2014   HGB 9.6* 12/09/2014   HCT 30.3* 12/09/2014   MCV 65.6* 12/09/2014   PLT 162 12/09/2014   NEUTROABS 3.8 12/09/2014   ASSESSMENT & PLAN:  Breast cancer of lower-outer quadrant of left female breast 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 Today is cycle 4 day 1. Plan is for 6 cycles  Chemotherapy toxicities: 1. Chemotherapy-induced mucositis: Improved with Magic mouthwash and Valtrex, symptoms seem to be coming back after each treatment. 2. Dehydration 3. Bone pain related to Neulasta: Given prescription for Percocets 4. Dyspepsia treated with Protonix 5. Alopecia 6. Twice a month heavy menstrual bleeding: The bleeding symptoms have stopped and hence she would like to discontinue Zoladex treatments. 7. Chemotherapy-induced anemia 8. Chemotherapy-induced nausea: Since Zofran was causing her headaches, she is unable  to take the Zofran. I discussed with her today dexamethasone for treatment of nausea recommended that she take Compazine. Will discontinue Aloxi as apparently she had a reaction to it. 9. Vaginal dryness: I provided her samples for lubricants   Monitoring closely for toxicities Return to clinic in 3 weeks for cycle 5     Orders Placed This Encounter  Procedures  . CBC with Differential    Standing Status: Future     Number of Occurrences:      Standing Expiration Date: 12/09/2015  . Comprehensive metabolic panel (Cmet) - [facility]    Standing Status: Future     Number of Occurrences:      Standing Expiration Date: 12/09/2015   The patient has a good understanding of the overall plan. she agrees with it. She will call with any problems that may develop before her next visit here.   Rulon Eisenmenger, MD

## 2014-12-09 NOTE — Patient Instructions (Signed)

## 2014-12-09 NOTE — Patient Instructions (Signed)
[facility] Discharge Instructions for Patients Receiving Chemotherapy  Today you received the following chemotherapy agents Herceptin, Perjeta, Taxotere, and Carboplatin.  To help prevent nausea and vomiting after your treatment, we encourage you to take your nausea medication as prescribed.    If you develop nausea and vomiting that is not controlled by your nausea medication, call the clinic.   BELOW ARE SYMPTOMS THAT SHOULD BE REPORTED IMMEDIATELY:  *FEVER GREATER THAN 100.5 F  *CHILLS WITH OR WITHOUT FEVER  NAUSEA AND VOMITING THAT IS NOT CONTROLLED WITH YOUR NAUSEA MEDICATION  *UNUSUAL SHORTNESS OF BREATH  *UNUSUAL BRUISING OR BLEEDING  TENDERNESS IN MOUTH AND THROAT WITH OR WITHOUT PRESENCE OF ULCERS  *URINARY PROBLEMS  *BOWEL PROBLEMS  UNUSUAL RASH Items with * indicate a potential emergency and should be followed up as soon as possible.  Feel free to call the clinic you have any questions or concerns. The clinic phone number is [phone].    

## 2014-12-09 NOTE — Telephone Encounter (Signed)
, °

## 2014-12-10 ENCOUNTER — Ambulatory Visit: Payer: No Typology Code available for payment source

## 2014-12-10 ENCOUNTER — Ambulatory Visit (HOSPITAL_BASED_OUTPATIENT_CLINIC_OR_DEPARTMENT_OTHER): Payer: Self-pay

## 2014-12-10 DIAGNOSIS — Z5189 Encounter for other specified aftercare: Secondary | ICD-10-CM

## 2014-12-10 DIAGNOSIS — C50512 Malignant neoplasm of lower-outer quadrant of left female breast: Secondary | ICD-10-CM

## 2014-12-10 MED ORDER — PEGFILGRASTIM INJECTION 6 MG/0.6ML ~~LOC~~
6.0000 mg | PREFILLED_SYRINGE | Freq: Once | SUBCUTANEOUS | Status: AC
  Administered 2014-12-10: 6 mg via SUBCUTANEOUS
  Filled 2014-12-10: qty 0.6

## 2014-12-10 NOTE — Patient Instructions (Signed)
Pegfilgrastim injection What is this medicine? PEGFILGRASTIM (peg fil GRA stim) is a long-acting granulocyte colony-stimulating factor that stimulates the growth of neutrophils, a type of white blood cell important in the body's fight against infection. It is used to reduce the incidence of fever and infection in patients with certain types of cancer who are receiving chemotherapy that affects the bone marrow. This medicine may be used for other purposes; ask your health care provider or pharmacist if you have questions. COMMON BRAND NAME(S): Neulasta What should I tell my health care provider before I take this medicine? They need to know if you have any of these conditions: -latex allergy -ongoing radiation therapy -sickle cell disease -skin reactions to acrylic adhesives (On-Body Injector only) -an unusual or allergic reaction to pegfilgrastim, filgrastim, other medicines, foods, dyes, or preservatives -pregnant or trying to get pregnant -breast-feeding How should I use this medicine? This medicine is for injection under the skin. If you get this medicine at home, you will be taught how to prepare and give the pre-filled syringe or how to use the On-body Injector. Refer to the patient Instructions for Use for detailed instructions. Use exactly as directed. Take your medicine at regular intervals. Do not take your medicine more often than directed. It is important that you put your used needles and syringes in a special sharps container. Do not put them in a trash can. If you do not have a sharps container, call your pharmacist or healthcare provider to get one. Talk to your pediatrician regarding the use of this medicine in children. Special care may be needed. Overdosage: If you think you have taken too much of this medicine contact a poison control center or emergency room at once. NOTE: This medicine is only for you. Do not share this medicine with others. What if I miss a dose? It is  important not to miss your dose. Call your doctor or health care professional if you miss your dose. If you miss a dose due to an On-body Injector failure or leakage, a new dose should be administered as soon as possible using a single prefilled syringe for manual use. What may interact with this medicine? Interactions have not been studied. Give your health care provider a list of all the medicines, herbs, non-prescription drugs, or dietary supplements you use. Also tell them if you smoke, drink alcohol, or use illegal drugs. Some items may interact with your medicine. This list may not describe all possible interactions. Give your health care provider a list of all the medicines, herbs, non-prescription drugs, or dietary supplements you use. Also tell them if you smoke, drink alcohol, or use illegal drugs. Some items may interact with your medicine. What should I watch for while using this medicine? You may need blood work done while you are taking this medicine. If you are going to need a MRI, CT scan, or other procedure, tell your doctor that you are using this medicine (On-Body Injector only). What side effects may I notice from receiving this medicine? Side effects that you should report to your doctor or health care professional as soon as possible: -allergic reactions like skin rash, itching or hives, swelling of the face, lips, or tongue -dizziness -fever -pain, redness, or irritation at site where injected -pinpoint red spots on the skin -shortness of breath or breathing problems -stomach or side pain, or pain at the shoulder -swelling -tiredness -trouble passing urine Side effects that usually do not require medical attention (report to your doctor   or health care professional if they continue or are bothersome): -bone pain -muscle pain This list may not describe all possible side effects. Call your doctor for medical advice about side effects. You may report side effects to FDA at  1-800-FDA-1088. Where should I keep my medicine? Keep out of the reach of children. Store pre-filled syringes in a refrigerator between 2 and 8 degrees C (36 and 46 degrees F). Do not freeze. Keep in carton to protect from light. Throw away this medicine if it is left out of the refrigerator for more than 48 hours. Throw away any unused medicine after the expiration date. NOTE: This sheet is a summary. It may not cover all possible information. If you have questions about this medicine, talk to your doctor, pharmacist, or health care provider.  2015, Elsevier/Gold Standard. (2014-01-31 16:14:05)  

## 2014-12-12 ENCOUNTER — Ambulatory Visit: Payer: No Typology Code available for payment source

## 2014-12-20 ENCOUNTER — Other Ambulatory Visit: Payer: Self-pay | Admitting: Nurse Practitioner

## 2014-12-20 ENCOUNTER — Ambulatory Visit: Payer: No Typology Code available for payment source

## 2014-12-20 ENCOUNTER — Encounter: Payer: No Typology Code available for payment source | Admitting: Nurse Practitioner

## 2014-12-20 DIAGNOSIS — C50512 Malignant neoplasm of lower-outer quadrant of left female breast: Secondary | ICD-10-CM

## 2014-12-20 MED ORDER — GOSERELIN ACETATE 3.6 MG ~~LOC~~ IMPL
3.6000 mg | DRUG_IMPLANT | SUBCUTANEOUS | Status: DC
  Filled 2014-12-20: qty 3.6

## 2014-12-23 ENCOUNTER — Other Ambulatory Visit: Payer: Self-pay | Admitting: Pharmacist

## 2014-12-23 NOTE — Progress Notes (Signed)
Zoladex discontinued as vaginal bleeding stopped per MD note (transcribed) & order given 12/20/14 to d/c Zoladex  Kennith Center, Pharm.D., CPP 12/23/2014@4 :48 PM

## 2014-12-27 ENCOUNTER — Other Ambulatory Visit: Payer: Self-pay | Admitting: Emergency Medicine

## 2014-12-27 ENCOUNTER — Telehealth: Payer: Self-pay | Admitting: Oncology

## 2014-12-27 DIAGNOSIS — C50512 Malignant neoplasm of lower-outer quadrant of left female breast: Secondary | ICD-10-CM

## 2014-12-27 NOTE — Telephone Encounter (Signed)
perp of ot sch pt ECHO-no pre-auth pt has no ins-cld pt and pt spks no English-cld Julie (interpeter)[phone] and adv of pt appts & time/location-Julie states she will call pt

## 2014-12-30 ENCOUNTER — Encounter: Payer: Self-pay | Admitting: Oncology

## 2014-12-30 ENCOUNTER — Other Ambulatory Visit (HOSPITAL_BASED_OUTPATIENT_CLINIC_OR_DEPARTMENT_OTHER): Payer: No Typology Code available for payment source

## 2014-12-30 ENCOUNTER — Other Ambulatory Visit: Payer: Self-pay | Admitting: Oncology

## 2014-12-30 ENCOUNTER — Telehealth: Payer: Self-pay | Admitting: Hematology and Oncology

## 2014-12-30 ENCOUNTER — Ambulatory Visit (HOSPITAL_BASED_OUTPATIENT_CLINIC_OR_DEPARTMENT_OTHER): Payer: Self-pay | Admitting: Oncology

## 2014-12-30 ENCOUNTER — Ambulatory Visit (HOSPITAL_BASED_OUTPATIENT_CLINIC_OR_DEPARTMENT_OTHER): Payer: Self-pay

## 2014-12-30 VITALS — BP 99/65 | HR 87 | Temp 98.8°F | Resp 18 | Ht 60.0 in | Wt 111.1 lb

## 2014-12-30 DIAGNOSIS — Z5111 Encounter for antineoplastic chemotherapy: Secondary | ICD-10-CM

## 2014-12-30 DIAGNOSIS — C50512 Malignant neoplasm of lower-outer quadrant of left female breast: Secondary | ICD-10-CM

## 2014-12-30 DIAGNOSIS — M899 Disorder of bone, unspecified: Secondary | ICD-10-CM

## 2014-12-30 DIAGNOSIS — D6481 Anemia due to antineoplastic chemotherapy: Secondary | ICD-10-CM

## 2014-12-30 DIAGNOSIS — C773 Secondary and unspecified malignant neoplasm of axilla and upper limb lymph nodes: Secondary | ICD-10-CM

## 2014-12-30 DIAGNOSIS — K1231 Oral mucositis (ulcerative) due to antineoplastic therapy: Secondary | ICD-10-CM

## 2014-12-30 DIAGNOSIS — E860 Dehydration: Secondary | ICD-10-CM

## 2014-12-30 DIAGNOSIS — R110 Nausea: Secondary | ICD-10-CM

## 2014-12-30 DIAGNOSIS — Z5112 Encounter for antineoplastic immunotherapy: Secondary | ICD-10-CM

## 2014-12-30 LAB — CBC WITH DIFFERENTIAL/PLATELET
BASO%: 0.6 % (ref 0.0–2.0)
BASOS ABS: 0.1 10*3/uL (ref 0.0–0.1)
EOS ABS: 0 10*3/uL (ref 0.0–0.5)
EOS%: 0.3 % (ref 0.0–7.0)
HCT: 30.8 % — ABNORMAL LOW (ref 34.8–46.6)
HGB: 9.9 g/dL — ABNORMAL LOW (ref 11.6–15.9)
LYMPH%: 24.7 % (ref 14.0–49.7)
MCH: 21.6 pg — ABNORMAL LOW (ref 25.1–34.0)
MCHC: 32.1 g/dL (ref 31.5–36.0)
MCV: 67.2 fL — ABNORMAL LOW (ref 79.5–101.0)
MONO#: 0.5 10*3/uL (ref 0.1–0.9)
MONO%: 5.9 % (ref 0.0–14.0)
NEUT#: 5.5 10*3/uL (ref 1.5–6.5)
NEUT%: 68.5 % (ref 38.4–76.8)
NRBC: 0 % (ref 0–0)
Platelets: 166 10*3/uL (ref 145–400)
RBC: 4.58 10*6/uL (ref 3.70–5.45)
RDW: 19.9 % — ABNORMAL HIGH (ref 11.2–14.5)
WBC: 8 10*3/uL (ref 3.9–10.3)
lymph#: 2 10*3/uL (ref 0.9–3.3)

## 2014-12-30 LAB — COMPREHENSIVE METABOLIC PANEL (CC13)
ALK PHOS: 106 U/L (ref 40–150)
ALT: 18 U/L (ref 0–55)
AST: 21 U/L (ref 5–34)
Albumin: 4.1 g/dL (ref 3.5–5.0)
Anion Gap: 9 mEq/L (ref 3–11)
BILIRUBIN TOTAL: 0.37 mg/dL (ref 0.20–1.20)
BUN: 8.6 mg/dL (ref 7.0–26.0)
CALCIUM: 9.2 mg/dL (ref 8.4–10.4)
CO2: 24 mEq/L (ref 22–29)
Chloride: 108 mEq/L (ref 98–109)
Creatinine: 0.7 mg/dL (ref 0.6–1.1)
Glucose: 124 mg/dl (ref 70–140)
Potassium: 3.9 mEq/L (ref 3.5–5.1)
Sodium: 141 mEq/L (ref 136–145)
TOTAL PROTEIN: 6.4 g/dL (ref 6.4–8.3)

## 2014-12-30 MED ORDER — DOCETAXEL CHEMO INJECTION 160 MG/16ML
75.0000 mg/m2 | Freq: Once | INTRAVENOUS | Status: AC
  Administered 2014-12-30: 110 mg via INTRAVENOUS
  Filled 2014-12-30: qty 11

## 2014-12-30 MED ORDER — DIPHENHYDRAMINE HCL 25 MG PO CAPS
25.0000 mg | ORAL_CAPSULE | Freq: Once | ORAL | Status: AC
  Administered 2014-12-30: 25 mg via ORAL

## 2014-12-30 MED ORDER — HEPARIN SOD (PORK) LOCK FLUSH 100 UNIT/ML IV SOLN
500.0000 [IU] | Freq: Once | INTRAVENOUS | Status: AC | PRN
  Administered 2014-12-30: 500 [IU]
  Filled 2014-12-30: qty 5

## 2014-12-30 MED ORDER — SODIUM CHLORIDE 0.9 % IV SOLN
Freq: Once | INTRAVENOUS | Status: AC
  Administered 2014-12-30: 12:00:00 via INTRAVENOUS

## 2014-12-30 MED ORDER — SODIUM CHLORIDE 0.9 % IJ SOLN
10.0000 mL | INTRAMUSCULAR | Status: DC | PRN
  Administered 2014-12-30: 10 mL
  Filled 2014-12-30: qty 10

## 2014-12-30 MED ORDER — ACETAMINOPHEN 325 MG PO TABS
ORAL_TABLET | ORAL | Status: AC
  Filled 2014-12-30: qty 2

## 2014-12-30 MED ORDER — PERTUZUMAB CHEMO INJECTION 420 MG/14ML
420.0000 mg | Freq: Once | INTRAVENOUS | Status: AC
  Administered 2014-12-30: 420 mg via INTRAVENOUS
  Filled 2014-12-30: qty 14

## 2014-12-30 MED ORDER — ACETAMINOPHEN 325 MG PO TABS
650.0000 mg | ORAL_TABLET | Freq: Once | ORAL | Status: AC
  Administered 2014-12-30: 650 mg via ORAL

## 2014-12-30 MED ORDER — SODIUM CHLORIDE 0.9 % IV SOLN
6.0000 mg/kg | Freq: Once | INTRAVENOUS | Status: AC
  Administered 2014-12-30: 315 mg via INTRAVENOUS
  Filled 2014-12-30: qty 15

## 2014-12-30 MED ORDER — DIPHENHYDRAMINE HCL 25 MG PO CAPS
ORAL_CAPSULE | ORAL | Status: AC
  Filled 2014-12-30: qty 1

## 2014-12-30 MED ORDER — DEXAMETHASONE SODIUM PHOSPHATE 20 MG/5ML IJ SOLN
20.0000 mg | Freq: Once | INTRAMUSCULAR | Status: AC
  Administered 2014-12-30: 20 mg via INTRAVENOUS

## 2014-12-30 MED ORDER — SODIUM CHLORIDE 0.9 % IV SOLN
660.0000 mg | Freq: Once | INTRAVENOUS | Status: AC
  Administered 2014-12-30: 660 mg via INTRAVENOUS
  Filled 2014-12-30: qty 66

## 2014-12-30 MED ORDER — DEXAMETHASONE SODIUM PHOSPHATE 20 MG/5ML IJ SOLN
INTRAMUSCULAR | Status: AC
  Filled 2014-12-30: qty 5

## 2014-12-30 NOTE — Telephone Encounter (Signed)
Pt confirmed labs/ov/inj per 02/15 POF, gave pt AVS... KJ, sent msg to add chemo °

## 2014-12-30 NOTE — Patient Instructions (Signed)
[facility] Discharge Instructions for Patients Receiving Chemotherapy  Today you received the following chemotherapy agents:  Herceptin, Perjeta, Taxotere and Carboplatin  To help prevent nausea and vomiting after your treatment, we encourage you to take your nausea medication as ordered per MD.   If you develop nausea and vomiting that is not controlled by your nausea medication, call the clinic.   BELOW ARE SYMPTOMS THAT SHOULD BE REPORTED IMMEDIATELY:  *FEVER GREATER THAN 100.5 F  *CHILLS WITH OR WITHOUT FEVER  NAUSEA AND VOMITING THAT IS NOT CONTROLLED WITH YOUR NAUSEA MEDICATION  *UNUSUAL SHORTNESS OF BREATH  *UNUSUAL BRUISING OR BLEEDING  TENDERNESS IN MOUTH AND THROAT WITH OR WITHOUT PRESENCE OF ULCERS  *URINARY PROBLEMS  *BOWEL PROBLEMS  UNUSUAL RASH Items with * indicate a potential emergency and should be followed up as soon as possible.  Feel free to call the clinic you have any questions or concerns. The clinic phone number is [phone].

## 2014-12-30 NOTE — Progress Notes (Signed)
Patient Care Team: No Pcp Per Patient as PCP - General (General Practice)  DIAGNOSIS: Breast cancer of lower-outer quadrant of left female breast   Staging form: Breast, AJCC 7th Edition     Clinical: No stage assigned - Unsigned     Pathologic: Stage IIA (T2, N3a, cM0) - Signed by Rulon Eisenmenger, MD on 09/11/2014   SUMMARY OF ONCOLOGIC HISTORY:   Breast cancer of lower-outer quadrant of left female breast   06/06/2014 Imaging Palpable Left breast mass at 12:00 position 1.9 cm, initial biopsy 06/12/2014 revealed fibrocystic changes.   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 The genes tested were ATM, BARD1, BRCA1, BRCA2, BRIP1, CDH1, CHEK2, MRE11A, MUTYH, NBN, NF1, PALB2, PTEN, RAD50, RAD51C, RAD51D, and TP53.   10/04/2014 -  Chemotherapy Patient was started on adjuvant chemotherapy with Taxotere, Carboplatin, Herceptin, Perjeta.      CHIEF COMPLIANT: Cycle 4 DC H Perjeta  INTERVAL HISTORY: [name] is a 29 year old lady with above-mentioned history of left-sided breast cancer treated with mastectomy and is now on adjuvant chemotherapy with TCH Perjeta. She had tolerated cycle #4 fairly well. He reports having a sore throat and a runny nose for about 2 days. Sinus drainage is clear. She has an intermittent dry cough. Denies fevers. Reports that her children have been sick as well. Denies ear pain and sinus pressure.  REVIEW OF SYSTEMS:   Constitutional: Denies fevers,  chills or abnormal weight loss Eyes: Denies blurriness of vision Ears, nose, mouth, throat, and face: Positive for sore throat. Denies mucositis Respiratory: Positive for nonproductive cough. Denies dyspnea or wheezes Cardiovascular: Denies palpitation, chest discomfort or lower extremity swelling Gastrointestinal:  Denies nausea, heartburn or change in bowel habits Skin: Denies abnormal skin rashes Lymphatics: Denies new lymphadenopathy or easy bruising Neurological:Denies numbness, tingling or new weaknesses Behavioral/Psych: Mood is stable, no new changes  Breast:  denies any pain or lumps or nodules in either breasts All other systems were reviewed with the patient and are negative.  I have reviewed the past medical history, past surgical history, social history and family history with the patient and they are unchanged from previous note.  ALLERGIES:  has No Known Allergies.  MEDICATIONS:  Current Outpatient Prescriptions  Medication Sig Dispense Refill  . Alum & Mag Hydroxide-Simeth (MAGIC MOUTHWASH) SOLN Take 5 mLs by mouth 4 (four) times daily as needed for mouth pain. Swish, gargle and spit one to two teaspoonfuls every six hours as needed. May be swallowed if esophageal involvement. Shake well before using 120 mL 0  . dexamethasone (DECADRON) 4 MG tablet Take 2 tablets (8 mg total) by mouth 2 (two) times daily. Start the day before Taxotere. Then again the day after chemo for 3 days. 30 tablet 1  . diphenhydramine-acetaminophen (TYLENOL PM) 25-500 MG TABS Take 2 tablets by mouth at bedtime as needed.    Marland Kitchen ibuprofen (ADVIL,MOTRIN) 600 MG tablet Take 600 mg by mouth every 6 (six) hours as needed.    . lidocaine-prilocaine (EMLA) cream Apply to affected area once 30 g 3  .  LORazepam (ATIVAN) 0.5 MG tablet Take 0.5 mg by mouth every 6 (six) hours as needed for anxiety.    . methocarbamol (ROBAXIN) 500 MG tablet Take 1 tablet (500 mg total) by mouth every 8 (eight) hours as needed for  muscle spasms. 20 tablet 0  . Multiple Vitamin (MULTIVITAMIN WITH MINERALS) TABS tablet Take 1 tablet by mouth daily.    . ondansetron (ZOFRAN) 8 MG tablet Take by mouth every 8 (eight) hours as needed for nausea or vomiting.    Marland Kitchen oxyCODONE-acetaminophen (PERCOCET/ROXICET) 5-325 MG per tablet Take 1 tablet by mouth every 6 (six) hours as needed for severe pain. 30 tablet 0  . pantoprazole (PROTONIX) 40 MG tablet Take 1 tablet (40 mg total) by mouth daily. 30 tablet 0  . prochlorperazine (COMPAZINE) 10 MG tablet Take 1 tablet (10 mg total) by mouth every 6 (six) hours as needed for nausea or vomiting. 30 tablet 1  . valACYclovir (VALTREX) 500 MG tablet Take 1 tablet (500 mg total) by mouth 2 (two) times daily. 60 tablet 2   No current facility-administered medications for this visit.   Facility-Administered Medications Ordered in Other Visits  Medication Dose Route Frequency Provider Last Rate Last Dose  . sodium chloride 0.9 % injection 10 mL  10 mL Intravenous PRN Rulon Eisenmenger, MD   10 mL at 12/09/14 1632    PHYSICAL EXAMINATION: ECOG PERFORMANCE STATUS: 1 - Symptomatic but completely ambulatory  Filed Vitals:   12/30/14 1034  BP: 99/65  Pulse: 87  Temp: 98.8 F (37.1 C)  Resp: 18   Filed Weights   12/30/14 1034  Weight: 111 lb 1.6 oz (50.395 kg)    GENERAL:alert, no distress and comfortable SKIN: skin color, texture, turgor are normal, no rashes or significant lesions EYES: normal, Conjunctiva are pink and non-injected, sclera clear OROPHARYNX:no exudate, no erythema and lips, buccal mucosa, and tongue normal  NECK: supple, thyroid normal size, non-tender, without nodularity LYMPH:  no palpable lymphadenopathy in the cervical, axillary or inguinal LUNGS: clear to auscultation and percussion with normal breathing effort HEART: regular rate & rhythm and no murmurs and no lower extremity edema ABDOMEN:abdomen soft, non-tender and normal bowel sounds Musculoskeletal:no  cyanosis of digits and no clubbing  NEURO: alert & oriented x 3 with fluent speech, no focal motor/sensory deficits   LABORATORY DATA:  I have reviewed the data as listed   Chemistry      Component Value Date/Time   NA 141 12/30/2014 1014   NA 140 09/04/2014 1532   K 3.9 12/30/2014 1014   K 3.7 09/04/2014 1532   CL 105 09/04/2014 1532   CO2 24 12/30/2014 1014   CO2 25 09/04/2014 1532   BUN 8.6 12/30/2014 1014   BUN 7 09/04/2014 1532   CREATININE 0.7 12/30/2014 1014   CREATININE 0.58 09/04/2014 1532      Component Value Date/Time   CALCIUM 9.2 12/30/2014 1014   CALCIUM 9.5 09/04/2014 1532   ALKPHOS 106 12/30/2014 1014   AST 21 12/30/2014 1014   ALT 18 12/30/2014 1014   BILITOT 0.37 12/30/2014 1014       Lab Results  Component Value Date   WBC 8.0 12/30/2014   HGB 9.9* 12/30/2014   HCT 30.8* 12/30/2014   MCV 67.2* 12/30/2014   PLT 166 12/30/2014   NEUTROABS 5.5 12/30/2014   ASSESSMENT & PLAN:  Breast cancer of lower-outer quadrant of left female breast Left breast invasive ductal carcinoma multifocal disease with multiple nodules ranging from 0.3  cm up to 3.2 cm and 17/20 lymph nodes positive. 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 Today is cycle 5 day 1. Plan is for 6 cycles  Chemotherapy toxicities: 1. Chemotherapy-induced mucositis: Improved with Magic mouthwash and Valtrex, symptoms seem to be coming back after each treatment. 2. Dehydration 3. Bone pain related to Neulasta: Has Percocet at home. 4. Dyspepsia treated with Protonix 5. Alopecia 6. Twice a month heavy menstrual bleeding: The bleeding symptoms have stopped and hence she would like to discontinue Zoladex treatments. 7.  Chemotherapy-induced anemia 8. Chemotherapy-induced nausea: Since Zofran was causing her headaches, she is unable to take the Zofran. Has had a reaction to Aloxi. She is using dexamethasone Compazine to control her nausea. 9. Vaginal dryness: Vaginal lubricants have been provided.  The patient has a sore throat and runny nose. She is afebrile. Symptoms are likely viral in nature. We discussed symptomatic management including over-the-counter Mucinex twice a day. She was instructed to contact us if she develops a fever 100.5 or higher or any other concerning symptoms.  Monitoring closely for toxicities Return to clinic in 3 weeks for cycle 6     No orders of the defined types were placed in this encounter.   The patient has a good understanding of the overall plan. she agrees with it. She will call with any problems that may develop before her next visit here.   Mikey Bussing, NP

## 2014-12-31 ENCOUNTER — Telehealth: Payer: Self-pay | Admitting: *Deleted

## 2014-12-31 ENCOUNTER — Ambulatory Visit (HOSPITAL_BASED_OUTPATIENT_CLINIC_OR_DEPARTMENT_OTHER): Payer: Self-pay

## 2014-12-31 DIAGNOSIS — C50512 Malignant neoplasm of lower-outer quadrant of left female breast: Secondary | ICD-10-CM

## 2014-12-31 DIAGNOSIS — Z5189 Encounter for other specified aftercare: Secondary | ICD-10-CM

## 2014-12-31 MED ORDER — PEGFILGRASTIM INJECTION 6 MG/0.6ML ~~LOC~~
6.0000 mg | PREFILLED_SYRINGE | Freq: Once | SUBCUTANEOUS | Status: AC
  Administered 2014-12-31: 6 mg via SUBCUTANEOUS
  Filled 2014-12-31: qty 0.6

## 2014-12-31 NOTE — Telephone Encounter (Signed)
Per staff message and POF I have scheduled appts. Advised scheduler of appts. JMW  

## 2015-01-03 ENCOUNTER — Ambulatory Visit (HOSPITAL_COMMUNITY)
Admission: RE | Admit: 2015-01-03 | Discharge: 2015-01-03 | Disposition: A | Discharge status: Home / Self Care | Payer: Self-pay | Source: Ambulatory Visit | Attending: Hematology and Oncology | Admitting: Hematology and Oncology

## 2015-01-03 DIAGNOSIS — C50512 Malignant neoplasm of lower-outer quadrant of left female breast: Secondary | ICD-10-CM

## 2015-01-03 DIAGNOSIS — C50919 Malignant neoplasm of unspecified site of unspecified female breast: Secondary | ICD-10-CM | POA: Insufficient documentation

## 2015-01-03 DIAGNOSIS — Z08 Encounter for follow-up examination after completed treatment for malignant neoplasm: Secondary | ICD-10-CM

## 2015-01-03 NOTE — Progress Notes (Signed)
  Echocardiogram 2D Echocardiogram has been performed.  [name] FRANCES 01/03/2015, 10:52 AM

## 2015-01-20 ENCOUNTER — Ambulatory Visit: Payer: Self-pay

## 2015-01-20 ENCOUNTER — Other Ambulatory Visit: Payer: Self-pay | Admitting: Hematology and Oncology

## 2015-01-20 ENCOUNTER — Other Ambulatory Visit (HOSPITAL_BASED_OUTPATIENT_CLINIC_OR_DEPARTMENT_OTHER): Payer: Self-pay

## 2015-01-20 ENCOUNTER — Other Ambulatory Visit (HOSPITAL_BASED_OUTPATIENT_CLINIC_OR_DEPARTMENT_OTHER): Payer: Self-pay | Admitting: *Deleted

## 2015-01-20 ENCOUNTER — Ambulatory Visit (HOSPITAL_BASED_OUTPATIENT_CLINIC_OR_DEPARTMENT_OTHER): Payer: Self-pay

## 2015-01-20 ENCOUNTER — Ambulatory Visit (HOSPITAL_BASED_OUTPATIENT_CLINIC_OR_DEPARTMENT_OTHER): Payer: Self-pay | Admitting: Hematology and Oncology

## 2015-01-20 ENCOUNTER — Telehealth: Payer: Self-pay | Admitting: *Deleted

## 2015-01-20 ENCOUNTER — Telehealth: Payer: Self-pay | Admitting: Hematology and Oncology

## 2015-01-20 VITALS — BP 96/57 | HR 86 | Temp 98.2°F | Resp 18 | Ht 60.0 in | Wt 109.0 lb

## 2015-01-20 DIAGNOSIS — D6481 Anemia due to antineoplastic chemotherapy: Secondary | ICD-10-CM

## 2015-01-20 DIAGNOSIS — C773 Secondary and unspecified malignant neoplasm of axilla and upper limb lymph nodes: Secondary | ICD-10-CM

## 2015-01-20 DIAGNOSIS — M899 Disorder of bone, unspecified: Secondary | ICD-10-CM

## 2015-01-20 DIAGNOSIS — Z5112 Encounter for antineoplastic immunotherapy: Secondary | ICD-10-CM

## 2015-01-20 DIAGNOSIS — Z5111 Encounter for antineoplastic chemotherapy: Secondary | ICD-10-CM

## 2015-01-20 DIAGNOSIS — C50512 Malignant neoplasm of lower-outer quadrant of left female breast: Secondary | ICD-10-CM

## 2015-01-20 DIAGNOSIS — K1231 Oral mucositis (ulcerative) due to antineoplastic therapy: Secondary | ICD-10-CM

## 2015-01-20 DIAGNOSIS — R110 Nausea: Secondary | ICD-10-CM

## 2015-01-20 DIAGNOSIS — E860 Dehydration: Secondary | ICD-10-CM

## 2015-01-20 LAB — COMPREHENSIVE METABOLIC PANEL (CC13)
ALBUMIN: 3.7 g/dL (ref 3.5–5.0)
ALT: 8 U/L (ref 0–55)
ANION GAP: 8 meq/L (ref 3–11)
AST: 16 U/L (ref 5–34)
Alkaline Phosphatase: 103 U/L (ref 40–150)
BUN: 6.7 mg/dL — ABNORMAL LOW (ref 7.0–26.0)
CO2: 24 meq/L (ref 22–29)
Calcium: 9.2 mg/dL (ref 8.4–10.4)
Chloride: 111 mEq/L — ABNORMAL HIGH (ref 98–109)
Creatinine: 0.6 mg/dL (ref 0.6–1.1)
EGFR: >90 mL/min/{1.73_m2} (ref >90)
GLUCOSE: 92 mg/dL (ref 70–140)
POTASSIUM: 3.8 meq/L (ref 3.5–5.1)
SODIUM: 143 meq/L (ref 136–145)
TOTAL PROTEIN: 6.1 g/dL — AB (ref 6.4–8.3)
Total Bilirubin: 0.36 mg/dL (ref 0.20–1.20)

## 2015-01-20 LAB — CBC WITH DIFFERENTIAL/PLATELET
BASO%: 1 % (ref 0.0–2.0)
Basophils Absolute: 0.1 10*3/uL (ref 0.0–0.1)
EOS ABS: 0 10*3/uL (ref 0.0–0.5)
EOS%: 0.6 % (ref 0.0–7.0)
HCT: 28.9 % — ABNORMAL LOW (ref 34.8–46.6)
HGB: 8.8 g/dL — ABNORMAL LOW (ref 11.6–15.9)
LYMPH%: 29.1 % (ref 14.0–49.7)
MCH: 21 pg — ABNORMAL LOW (ref 25.1–34.0)
MCHC: 30.6 g/dL — ABNORMAL LOW (ref 31.5–36.0)
MCV: 68.7 fL — AB (ref 79.5–101.0)
MONO#: 0.6 10*3/uL (ref 0.1–0.9)
MONO%: 8.7 % (ref 0.0–14.0)
NEUT#: 3.8 10*3/uL (ref 1.5–6.5)
NEUT%: 60.6 % (ref 38.4–76.8)
Platelets: 278 10*3/uL (ref 145–400)
RBC: 4.2 10*6/uL (ref 3.70–5.45)
RDW: 20 % — ABNORMAL HIGH (ref 11.2–14.5)
WBC: 6.3 10*3/uL (ref 3.9–10.3)
lymph#: 1.8 10*3/uL (ref 0.9–3.3)

## 2015-01-20 LAB — IRON AND TIBC CHCC
%SAT: 32 % (ref 21–57)
Iron: 91 ug/dL (ref 41–142)
TIBC: 288 ug/dL (ref 236–444)
UIBC: 196 ug/dL (ref 120–384)

## 2015-01-20 LAB — FERRITIN CHCC: Ferritin: 78 ng/ml (ref 9–269)

## 2015-01-20 MED ORDER — SODIUM CHLORIDE 0.9 % IV SOLN
Freq: Once | INTRAVENOUS | Status: AC
Start: 2015-01-20 — End: 2015-01-20
  Administered 2015-01-20: 10:00:00 via INTRAVENOUS

## 2015-01-20 MED ORDER — SODIUM CHLORIDE 0.9 % IV SOLN
20.0000 mg | Freq: Once | INTRAVENOUS | Status: AC
  Administered 2015-01-20: 20 mg via INTRAVENOUS
  Filled 2015-01-20: qty 2

## 2015-01-20 MED ORDER — DIPHENHYDRAMINE HCL 25 MG PO CAPS
25.0000 mg | ORAL_CAPSULE | Freq: Once | ORAL | Status: AC
  Administered 2015-01-20: 25 mg via ORAL

## 2015-01-20 MED ORDER — DIPHENHYDRAMINE HCL 25 MG PO CAPS
ORAL_CAPSULE | ORAL | Status: AC
  Filled 2015-01-20: qty 2

## 2015-01-20 MED ORDER — DEXTROSE 5 % IV SOLN
75.0000 mg/m2 | Freq: Once | INTRAVENOUS | Status: AC
  Administered 2015-01-20: 110 mg via INTRAVENOUS
  Filled 2015-01-20: qty 11

## 2015-01-20 MED ORDER — DEXAMETHASONE SODIUM PHOSPHATE 20 MG/5ML IJ SOLN: 20.0000 mg | Freq: Once | INTRAMUSCULAR | Status: DC

## 2015-01-20 MED ORDER — PERTUZUMAB CHEMO INJECTION 420 MG/14ML
420.0000 mg | Freq: Once | INTRAVENOUS | Status: AC
  Administered 2015-01-20: 420 mg via INTRAVENOUS
  Filled 2015-01-20: qty 14

## 2015-01-20 MED ORDER — SODIUM CHLORIDE 0.9 % IJ SOLN
10.0000 mL | INTRAMUSCULAR | Status: DC | PRN
  Administered 2015-01-20: 10 mL
  Filled 2015-01-20: qty 10

## 2015-01-20 MED ORDER — ACETAMINOPHEN 325 MG PO TABS
650.0000 mg | ORAL_TABLET | Freq: Once | ORAL | Status: AC
  Administered 2015-01-20: 650 mg via ORAL

## 2015-01-20 MED ORDER — SODIUM CHLORIDE 0.9 % IV SOLN
655.8000 mg | Freq: Once | INTRAVENOUS | Status: AC
  Administered 2015-01-20: 660 mg via INTRAVENOUS
  Filled 2015-01-20: qty 66

## 2015-01-20 MED ORDER — TRASTUZUMAB CHEMO INJECTION 440 MG
6.0000 mg/kg | Freq: Once | INTRAVENOUS | Status: AC
  Administered 2015-01-20: 315 mg via INTRAVENOUS
  Filled 2015-01-20: qty 15

## 2015-01-20 MED ORDER — AZITHROMYCIN 250 MG PO TABS
ORAL_TABLET | ORAL | Status: DC
Start: 2015-01-20 — End: 2015-01-29

## 2015-01-20 MED ORDER — ACETAMINOPHEN 325 MG PO TABS
ORAL_TABLET | ORAL | Status: AC
  Filled 2015-01-20: qty 2

## 2015-01-20 MED ORDER — HEPARIN SOD (PORK) LOCK FLUSH 100 UNIT/ML IV SOLN
500.0000 [IU] | Freq: Once | INTRAVENOUS | Status: AC | PRN
  Administered 2015-01-20: 500 [IU]
  Filled 2015-01-20: qty 5

## 2015-01-20 NOTE — Progress Notes (Signed)
Patient Care Team: No Pcp Per Patient as PCP - General (General Practice)  DIAGNOSIS: Breast cancer of lower-outer quadrant of left female breast   Staging form: Breast, AJCC 7th Edition     Clinical: No stage assigned - Unsigned     Pathologic: Stage IIA (T2, N3a, cM0) - Signed by Rulon Eisenmenger, MD on 09/11/2014   SUMMARY OF ONCOLOGIC HISTORY:   Breast cancer of lower-outer quadrant of left female breast   06/06/2014 Imaging Palpable Left breast mass at 12:00 position 1.9 cm, initial biopsy 06/12/2014 revealed fibrocystic changes.   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 The genes tested were ATM, BARD1, BRCA1, BRCA2, BRIP1, CDH1, CHEK2, MRE11A, MUTYH, NBN, NF1, PALB2, PTEN, RAD50, RAD51C, RAD51D, and TP53.   10/04/2014 - 01/20/2015 Chemotherapy Patient was started on adjuvant chemotherapy with Taxotere, Carboplatin, Herceptin, Perjeta.      CHIEF COMPLIANT: Cycle 6 Taxotere carboplatin Herceptin and Perjeta  INTERVAL HISTORY: [name] is a 29 year old lady with above-mentioned history of left breast cancer status post mastectomy completing adjuvant chemotherapy today is cycle #6. She reports that she is doing very well from treatment denies any neuropathy nausea or vomiting. She complains of cough congestion and postnasal drip for the past 3 weeks.  REVIEW OF SYSTEMS:   Constitutional: Denies fevers, chills or abnormal weight loss Eyes: Denies blurriness of  vision Ears, nose, mouth, throat, and face: Denies mucositis or sore throat Respiratory: Complains of cough congestion and postnasal drip Cardiovascular: Denies palpitation, chest discomfort or lower extremity swelling Gastrointestinal:  Denies nausea, heartburn or change in bowel habits Skin: Denies abnormal skin rashes Lymphatics: Denies new lymphadenopathy or easy bruising Neurological:Denies numbness, tingling or new weaknesses Behavioral/Psych: Mood is stable, no new changes  Breast:  denies any pain or lumps or nodules in either breasts All other systems were reviewed with the patient and are negative.  I have reviewed the past medical history, past surgical history, social history and family history with the patient and they are unchanged from previous note.  ALLERGIES:  has No Known Allergies.  MEDICATIONS:  Current Outpatient Prescriptions  Medication Sig Dispense Refill  . Alum & Mag Hydroxide-Simeth (MAGIC MOUTHWASH) SOLN Take 5 mLs by mouth 4 (four) times daily as needed for mouth pain. Swish, gargle and spit one to two teaspoonfuls every six hours as needed. May be swallowed if esophageal involvement. Shake well before using 120 mL 0  . dexamethasone (DECADRON) 4 MG tablet Take 2 tablets (8 mg total) by mouth 2 (two) times daily. Start the day before Taxotere. Then again the day after chemo for 3 days. 30 tablet 1  . diphenhydramine-acetaminophen (TYLENOL PM) 25-500 MG TABS Take 2 tablets by mouth at bedtime as needed.    Marland Kitchen ibuprofen (ADVIL,MOTRIN) 600 MG tablet Take 600 mg by mouth every 6 (six) hours as needed.    . lidocaine-prilocaine (EMLA) cream Apply to affected area once 30 g 3  . LORazepam (ATIVAN) 0.5 MG tablet Take 0.5 mg by mouth every 6 (six) hours as needed for anxiety.    Marland Kitchen  methocarbamol (ROBAXIN) 500 MG tablet Take 1 tablet (500 mg total) by mouth every 8 (eight) hours as needed for muscle spasms. 20 tablet 0  . Multiple Vitamin (MULTIVITAMIN WITH MINERALS) TABS  tablet Take 1 tablet by mouth daily.    . ondansetron (ZOFRAN) 8 MG tablet Take by mouth every 8 (eight) hours as needed for nausea or vomiting.    Marland Kitchen oxyCODONE-acetaminophen (PERCOCET/ROXICET) 5-325 MG per tablet Take 1 tablet by mouth every 6 (six) hours as needed for severe pain. 30 tablet 0  . pantoprazole (PROTONIX) 40 MG tablet Take 1 tablet (40 mg total) by mouth daily. 30 tablet 0  . prochlorperazine (COMPAZINE) 10 MG tablet Take 1 tablet (10 mg total) by mouth every 6 (six) hours as needed for nausea or vomiting. 30 tablet 1  . valACYclovir (VALTREX) 500 MG tablet Take 1 tablet (500 mg total) by mouth 2 (two) times daily. 60 tablet 2  . azithromycin (ZITHROMAX Z-PAK) 250 MG tablet Take as directed 6 each 0   No current facility-administered medications for this visit.   Facility-Administered Medications Ordered in Other Visits  Medication Dose Route Frequency Provider Last Rate Last Dose  . sodium chloride 0.9 % injection 10 mL  10 mL Intravenous PRN Rulon Eisenmenger, MD   10 mL at 12/09/14 1632    PHYSICAL EXAMINATION: ECOG PERFORMANCE STATUS: 1 - Symptomatic but completely ambulatory  Filed Vitals:   01/20/15 0844  BP: 96/57  Pulse: 86  Temp: 98.2 F (36.8 C)  Resp: 18   Filed Weights   01/20/15 0844  Weight: 109 lb (49.442 kg)    GENERAL:alert, no distress and comfortable SKIN: skin color, texture, turgor are normal, no rashes or significant lesions EYES: normal, Conjunctiva are pink and non-injected, sclera clear OROPHARYNX:no exudate, no erythema and lips, buccal mucosa, and tongue normal  NECK: supple, thyroid normal size, non-tender, without nodularity LYMPH:  no palpable lymphadenopathy in the cervical, axillary or inguinal LUNGS: clear to auscultation and percussion with normal breathing effort HEART: regular rate & rhythm and no murmurs and no lower extremity edema ABDOMEN:abdomen soft, non-tender and normal bowel sounds Musculoskeletal:no cyanosis of digits  and no clubbing  NEURO: alert & oriented x 3 with fluent speech, no focal motor/sensory deficits  LABORATORY DATA:  I have reviewed the data as listed   Chemistry      Component Value Date/Time   NA 143 01/20/2015 0828   NA 140 09/04/2014 1532   K 3.8 01/20/2015 0828   K 3.7 09/04/2014 1532   CL 105 09/04/2014 1532   CO2 24 01/20/2015 0828   CO2 25 09/04/2014 1532   BUN 6.7* 01/20/2015 0828   BUN 7 09/04/2014 1532   CREATININE 0.6 01/20/2015 0828   CREATININE 0.58 09/04/2014 1532      Component Value Date/Time   CALCIUM 9.2 01/20/2015 0828   CALCIUM 9.5 09/04/2014 1532   ALKPHOS 103 01/20/2015 0828   AST 16 01/20/2015 0828   ALT 8 01/20/2015 0828   BILITOT 0.36 01/20/2015 0828       Lab Results  Component Value Date   WBC 6.3 01/20/2015   HGB 8.8* 01/20/2015   HCT 28.9* 01/20/2015   MCV 68.7* 01/20/2015   PLT 278 01/20/2015   NEUTROABS 3.8 01/20/2015   ASSESSMENT & PLAN:  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 Positive  for lymphovascular invasion, extracapsular tumor extension grade 3 ER 53%, PR 30%, HER-2 positive ratio 5.95, Ki-67 33%, T2, N3, M0 stage IIIc with high-grade DCIS status post left mastectomy 09/06/2014  Current treatment: Adjuvant chemotherapy with Taxotere, carboplatin, Herceptin and Perjeta. Today is cycle 6 day 1. This will complete chemotherapy.  Chemotherapy toxicities: 1. Chemotherapy-induced mucositis: Improved with Magic mouthwash and Valtrex, symptoms seem to be coming back after each treatment. 2. Dehydration 3. Bone pain related to Neulasta: Required pain medication with Percocets 4. Dyspepsia treated with Protonix: Much improved 5. Alopecia 6. Twice a month heavy menstrual bleeding: The bleeding symptoms have stopped and hence she would like to discontinue Zoladex  treatments. 7. Chemotherapy-induced anemia: Hemoglobin is down to 8.8. I would like to add iron studies to her drawn labs. 8. Chemotherapy-induced nausea: Since Zofran was causing her headaches, she is unable to take the Zofran. 9. Vaginal dryness: I provided her samples for lubricants   Monitoring closely for toxicities Echocardiogram 01/03/2015 showed an EF of 55-60%  Plan: 1. Referred to radiation oncology for adjuvant radiation 2. Continue with Herceptin maintenance every 3 weeks I will see her back in 6 weeks for follow-up    No orders of the defined types were placed in this encounter.   The patient has a good understanding of the overall plan. she agrees with it. She will call with any problems that may develop before her next visit here.   Rulon Eisenmenger, MD

## 2015-01-20 NOTE — Assessment & Plan Note (Addendum)
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 Today is cycle 6 day 1. This will complete chemotherapy.  Chemotherapy toxicities: 1. Chemotherapy-induced mucositis: Improved with Magic mouthwash and Valtrex, symptoms seem to be coming back after each treatment. 2. Dehydration 3. Bone pain related to Neulasta: Required pain medication with Percocets 4. Dyspepsia treated with Protonix: Much improved 5. Alopecia 6. Twice a month heavy menstrual bleeding: The bleeding symptoms have stopped and hence she would like to discontinue Zoladex treatments. 7. Chemotherapy-induced anemia 8. Chemotherapy-induced nausea: Since Zofran was causing her headaches, she is unable to take the Zofran. 9. Vaginal dryness: I provided her samples for lubricants   Monitoring closely for toxicities Echocardiogram 01/03/2015 showed an EF of 55-60% Return to clinic in 3 weeks for Herceptin maintenance.

## 2015-01-20 NOTE — Telephone Encounter (Signed)
appts made and avs pritned for pt,email to michelle to add hercep and email to terri f to enter rad onc ref which they will call the pt  [name]

## 2015-01-20 NOTE — Addendum Note (Signed)
Addended by: Prentiss Bells on: 01/20/2015 09:34 AM   Modules accepted: Orders

## 2015-01-20 NOTE — Telephone Encounter (Signed)
Per staff message and POF I have scheduled appts. Advised scheduler of appts. JMW  

## 2015-01-20 NOTE — Patient Instructions (Signed)
[facility] Discharge Instructions for Patients Receiving Chemotherapy  Today you received the following chemotherapy agents Taxol, Carboplatin, Herceptin and Perjeta.  To help prevent nausea and vomiting after your treatment, we encourage you to take your nausea medication as prescribed.   If you develop nausea and vomiting that is not controlled by your nausea medication, call the clinic.   BELOW ARE SYMPTOMS THAT SHOULD BE REPORTED IMMEDIATELY:  *FEVER GREATER THAN 100.5 F  *CHILLS WITH OR WITHOUT FEVER  NAUSEA AND VOMITING THAT IS NOT CONTROLLED WITH YOUR NAUSEA MEDICATION  *UNUSUAL SHORTNESS OF BREATH  *UNUSUAL BRUISING OR BLEEDING  TENDERNESS IN MOUTH AND THROAT WITH OR WITHOUT PRESENCE OF ULCERS  *URINARY PROBLEMS  *BOWEL PROBLEMS  UNUSUAL RASH Items with * indicate a potential emergency and should be followed up as soon as possible.  Feel free to call the clinic you have any questions or concerns. The clinic phone number is [phone].

## 2015-01-21 ENCOUNTER — Telehealth: Payer: Self-pay | Admitting: *Deleted

## 2015-01-21 ENCOUNTER — Ambulatory Visit (HOSPITAL_BASED_OUTPATIENT_CLINIC_OR_DEPARTMENT_OTHER): Payer: Self-pay

## 2015-01-21 DIAGNOSIS — C773 Secondary and unspecified malignant neoplasm of axilla and upper limb lymph nodes: Secondary | ICD-10-CM

## 2015-01-21 DIAGNOSIS — C50512 Malignant neoplasm of lower-outer quadrant of left female breast: Secondary | ICD-10-CM

## 2015-01-21 DIAGNOSIS — Z5189 Encounter for other specified aftercare: Secondary | ICD-10-CM

## 2015-01-21 MED ORDER — PEGFILGRASTIM INJECTION 6 MG/0.6ML ~~LOC~~
6.0000 mg | PREFILLED_SYRINGE | Freq: Once | SUBCUTANEOUS | Status: AC
  Administered 2015-01-21: 6 mg via SUBCUTANEOUS
  Filled 2015-01-21: qty 0.6

## 2015-01-21 NOTE — Telephone Encounter (Signed)
Called patient about Neulasta injection needing to scheduled later that original time.  Patient aware of new time.

## 2015-01-23 ENCOUNTER — Encounter: Payer: Self-pay | Admitting: Radiation Oncology

## 2015-01-23 NOTE — Progress Notes (Addendum)
Location of Breast Cancer: left lower outer breast  Histology per Pathology Report:  09/06/14 Diagnosis 1. 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 - INTRANODAL TUMOR DEPOSIT IS 2.1 CM. - POSITIVE FOR EXTRACAPSULAR TUMOR EXTENSION. 2. Breast, simple mastectomy, left - MULTIFOCAL INVASIVE ADENOCARCINOMA, SEE COMMENT. - POSITIVE FOR LYMPH VASCULAR INVASION. - INVASIVE TUMOR IS 0.9 CM FROM NEAREST MARGIN (DEEP). 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 - PREVIOUS BIOPSY SITE. - SEE TUMOR SYNOPTIC TEMPLATE BELOW. 3. Lymph nodes, regional resection, left axillary - SEVENTEEN LYMPH NODES, POSITIVE FOR METASTATIC MAMMARY CARCINOMA (17/20). - INTRANODAL TUMOR DEPOSITS ARE 0.8 CM, 0.9 CM, 0.9 CM, 0.5 CM, 1.1 CM, 0.7 CM, 0.9 CM, 1.5 CM, 1.2 CM, 1.9 CM, 2.5 CM, 3.2 CM, 0.3 CM, 0.6 CM, 0.6 CM, 0.5 CM AND 0.7 CM. - POSITIVE FOR EXTRACAPSULAR TUMOR EXTENSION.  07/10/14 Diagnosis Breast, lumpectomy, left - INVASIVE DUCTAL CARCINOMA, 2.5 CM - HIGH GRADE DUCTAL CARCINOMA IN SITU. - INVASIVE CARCINOMA BROADLY INVOLVES THE SUPERFICIAL MARGIN,  SEE ONCOLOGY TABLE.  Receptor Status: ER(58%), PR (13%), Her2-neu (+ ratio 5.17)  Did patient present with symptoms (if so, please note symptoms) or was this found on screening mammography?:  Patient c/o left breast lump x 6 months in July 2015.  Past/Anticipated interventions by surgeon, if any: Dr Marlou Starks -  09/06/14 left lumpectomy, axillary resection  Past/Anticipated interventions by medical oncology, if any: Chemotherapy Dr Lindi Adie: Adjuvant chemotherapy with Taxotere, carboplatin, Herceptin and Perjeta. completed chemotherapy 01/20/15.  Lymphedema issues, if any: slight swelling of left axilla, saw surgeon 3-4 weeks ago who is "watching this"  Pain issues, if any:   no  SAFETY ISSUES:  Prior radiation? no  Pacemaker/ICD? no  Possible current pregnancy? No, no longer  menstruating  Is the patient on methotrexate? no  Current Complaints / other details:  Married, Menarche age 70, P 2, 1 daughter age 60, 71 son age 53, family hx of GI cancers   Nausea 1-2 days a week but not taking any medication for this. She states the medications were not helpful for nausea during chemotherapy.  Almyra Free. Interpreter present with patient today.  Andria Rhein, RN 01/23/2015,1:26 PM

## 2015-01-28 ENCOUNTER — Encounter: Payer: Self-pay | Admitting: Radiation Oncology

## 2015-01-28 NOTE — Progress Notes (Signed)
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 Breast cancer of lower-outer quadrant of left female breast 174.5 C50.512     HISTORY OF PRESENT ILLNESS::[name] is a 29 y.o. female who presented with a palpable left breast mass.  Mammogram on 06-06-14 revealed  A round left central breast mass that was suspicious. On [place] it measured 1.9cm and was indeterminate.   Biopsy in July was negative for malignancy. A lumpectomy on 07-10-14 was positive for 2.5 cm tumor (invasive ductal carcinoma, with DCIS) and  had a broadly positive margin.  MRI of the breasts on 07-30-14 revealed a 2.4 cm mass at the excisional cavity suspicious for residual disease, and a separate 32m mass in the left breast that was worrisome, and multiple suspicious left axillary nodes.  She went on to undergo a simple left breast mastectomy and ALND.  18/21 nodes were positive and multifocal disease was found in the breast. Margins were negative. +ECE.  PET in Oct 11 2014 was negative for distant metastases.  She then received chemotherapy with Dr GLindi Adie She has received Taxotere, carboplatin, Herceptin and Perjeta. Her last cycle was on 01-20-15. Plans to continue with Herceptin.  She has mild swelling in her left axilla, but not her arm.  She has sores in her mouth and will get a MMW refill via Dr GLindi Adie She has a 28yo boy and 753yogirl.  PATHOLOGY: 10-23-15URGICAL PATHOLOGY FINAL DIAGNOSIS Diagnosis 1. 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 - INTRANODAL TUMOR DEPOSIT IS 2.1  CM. - POSITIVE FOR EXTRACAPSULAR TUMOR EXTENSION. 2. Breast, simple mastectomy, left - MULTIFOCAL INVASIVE ADENOCARCINOMA, SEE COMMENT. - POSITIVE FOR LYMPH VASCULAR INVASION. - INVASIVE TUMOR IS 0.9 CM FROM NEAREST MARGIN (DEEP). 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 - PREVIOUS BIOPSY SITE. - SEE TUMOR SYNOPTIC TEMPLATE BELOW. 3. Lymph nodes, regional resection, left axillary - SEVENTEEN LYMPH NODES, POSITIVE FOR METASTATIC MAMMARY CARCINOMA (17/20). - INTRANODAL TUMOR DEPOSITS ARE 0.8 CM, 0.9 CM, 0.9 CM, 0.5 CM, 1.1 CM, 0.7 CM, 0.9 CM, 1.5 CM, 1.2 CM, 1.9 CM, 2.5 CM, 3.2 CM, 0.3 CM, 0.6 CM, 0.6 CM, 0.5 CM AND 0.7 CM. - POSITIVE FOR EXTRACAPSULAR TUMOR EXTENSION. Microscopic Comment 2. BREAST, INVASIVE TUMOR, WITH LYMPH NODES PRESENT Specimen, including laterality and lymph node sampling (sentinel, non-sentinel): Left breast with sentinel lymph node. Procedure: Simple mastectomy. Tumor # 1 Histologic type: Ductal Grade: III of III Tubule formation: 3 Nuclear pleomorphism: 3 Mitotic:3 Tumor size (gross measurement): 2.5 cm Margins: Invasive, distance to closest margin: 0.9 cm In-situ, distance to closest margin: 0.9 cm 1 of 4 FINAL for Cook, Veronica E (([id number] Microscopic Comment(continued) If margin positive, focally or broadly: N/A Tumor # 2 Histologic type: Ductal Grade: III of III Tubule formation: 3 Nuclear pleomorphism: 3 Mitotic:3 Tumor size (gross measurement): 1.2 cm Margins: Invasive, distance to closest margin: 1.1 cm (deep) In-situ, distance to closest margin: 1.1 cm (deep) If margin positive, focally or broadly: N/A Lymphovascular invasion: Present. Ductal carcinoma in situ: Present Grade: III of III Extensive intraductal component: Absent. Lobular  neoplasia: Absent. Tumor focality: Multifocal Treatment effect: None. If present, treatment effect in breast tissue, lymph nodes or both: N/A Extent of tumor: Skin:  N/A Nipple: N/A Skeletal muscle: Negative for tumor. Lymph nodes: Examined: 1 Sentinel 20 Non-sentinel 21 Total Lymph nodes with metastasis: 18 Isolated tumor cells (< 0.2 mm): 0 Micrometastasis: (> 0.2 mm and < 2.0 mm): 0 Macrometastasis: (> 2.0 mm): 18 Extracapsular extension: Present. Breast prognostic profile: See comment. Estrogen receptor: Not repeated previous study demonstrated 58% positivity ([id number]) Progesterone receptor: Not repeated previous study demonstrated 13% positivity ([id number]) Her 2 neu: No repeated previous study demonstrated amplification (5.95) Ki-67: No repeated, previous study demonstrated 33% proliferation rate. Non-neoplastic breast: Previous biopsy site and fibrocystic change. TNM: mpT2, pN3a, pMX Comments: The previous biopsy site was located adjacent to the larger tumor mass (tumor #1). Please contact pathology as to which tumor focus breast prognostic studies are needed, if indicated. (CRR:gt, 09/09/14) Mali RUND DO Pathologist, Electronic Signature (Case signed 09/09/2014)  PREVIOUS RADIATION THERAPY: No  PAST MEDICAL HISTORY:  has a past medical history of Cancer; Anemia; and Malignant neoplasm of breast (female), unspecified site (07/10/14).    PAST SURGICAL HISTORY: Past Surgical History  Procedure Laterality Date  . Cesarean section    . Left breast biopsy    . Breast lumpectomy Left 07/10/2014    Procedure: LEFT BREAST LUMPECTOMY;  Surgeon: Merrie Roof, MD;  Location: [facility];  Service: General;  Laterality: Left;  . Portacath placement Right 09/06/2014    Procedure: INSERTION PORT-A-CATH;  Surgeon: Autumn Messing III, MD;  Location: [facility];  Service: General;  Laterality: Right;  . Mastectomy w/ sentinel node biopsy Left 09/06/2014    Procedure: LEFT MASTECTOMY WITH SENTINEL LYMPH NODE BIOPSY/NODE MAPPING;  Surgeon: Autumn Messing III, MD;  Location: [facility];  Service: General;  Laterality: Left;    FAMILY HISTORY: family  history includes Diabetes in her maternal grandmother and mother; Hypertension in her maternal grandmother and mother; Stomach cancer in her maternal grandfather and maternal grandmother.  SOCIAL HISTORY:  reports that she has never smoked. She has never used smokeless tobacco. She reports that she drinks alcohol. She reports that she does not use illicit drugs.  ALLERGIES: Review of patient's allergies indicates no known allergies.  MEDICATIONS:  Current Outpatient Prescriptions  Medication Sig Dispense Refill  . dexamethasone (DECADRON) 4 MG tablet Take 2 tablets (8 mg total) by mouth 2 (two) times daily. Start the day before Taxotere. Then again the day after chemo for 3 days. (Patient not taking: Reported on 01/29/2015) 30 tablet 1  . diphenhydramine-acetaminophen (TYLENOL PM) 25-500 MG TABS Take 2 tablets by mouth at bedtime as needed.    Marland Kitchen ibuprofen (ADVIL,MOTRIN) 600 MG tablet Take 600 mg by mouth every 6 (six) hours as needed.    . lidocaine-prilocaine (EMLA) cream Apply to affected area once (Patient not taking: Reported on 01/29/2015) 30 g 3  . LORazepam (ATIVAN) 0.5 MG tablet Take 0.5 mg by mouth every 6 (six) hours as needed for anxiety.    . methocarbamol (ROBAXIN) 500 MG tablet Take 1 tablet (500 mg total) by mouth every 8 (eight) hours as needed for muscle spasms. (Patient not taking: Reported on 01/29/2015) 20 tablet 0  . Multiple Vitamin (MULTIVITAMIN WITH MINERALS) TABS tablet Take 1 tablet by mouth daily.    . ondansetron (ZOFRAN) 8 MG tablet Take by mouth every 8 (eight) hours as needed for nausea or vomiting.    Marland Kitchen oxyCODONE-acetaminophen (PERCOCET/ROXICET)  5-325 MG per tablet Take 1 tablet by mouth every 6 (six) hours as needed for severe pain. (Patient not taking: Reported on 01/29/2015) 30 tablet 0  . pantoprazole (PROTONIX) 40 MG tablet Take 1 tablet (40 mg total) by mouth daily. (Patient not taking: Reported on 01/29/2015) 30 tablet 0  . prochlorperazine (COMPAZINE) 10 MG  tablet Take 1 tablet (10 mg total) by mouth every 6 (six) hours as needed for nausea or vomiting. (Patient not taking: Reported on 01/29/2015) 30 tablet 1  . valACYclovir (VALTREX) 500 MG tablet Take 1 tablet (500 mg total) by mouth 2 (two) times daily. (Patient not taking: Reported on 01/29/2015) 60 tablet 2   No current facility-administered medications for this encounter.   Facility-Administered Medications Ordered in Other Encounters  Medication Dose Route Frequency Provider Last Rate Last Dose  . sodium chloride 0.9 % injection 10 mL  10 mL Intravenous PRN Nicholas Lose, MD   10 mL at 12/09/14 1632    REVIEW OF SYSTEMS:  Notable for that above.   PHYSICAL EXAM:  height is 5' (1.524 m) and weight is 101 lb 6.4 oz (45.995 kg). Her oral temperature is 98 F (36.7 C). Her blood pressure is 92/63 and her pulse is 86. Her respiration is 18.   General: Alert and oriented, in no acute distress HEENT: Head - alopecia. Extraocular movements are intact. Oropharynx is clear. Neck: Neck is supple, no palpable cervical or supraclavicular lymphadenopathy. Heart: Regular in rate and rhythm with no murmurs, rubs, or gallops. Chest: Clear to auscultation bilaterally, with no rhonchi, wheezes, or rales. Abdomen: Soft, nontender, nondistended, with no rigidity or guarding. Extremities: No cyanosis or edema. Lymphatics: see Neck Exam, breast exam Skin: No concerning lesions. Musculoskeletal: symmetric strength and muscle tone throughout. Neurologic: Cranial nerves II through XII are grossly intact. No obvious focalities. Speech is fluent. Coordination is intact. Psychiatric: Judgment and insight are intact. Affect is appropriate. Breasts: right breast unremarkable.  Left chest wall without nodularity; skin is slightly hyperpigmented around scar. No axillary lymphadenopathy.  ECOG = 1  0 - Asymptomatic (Fully active, able to carry on all predisease activities without restriction)  1 - Symptomatic but  completely ambulatory (Restricted in physically strenuous activity but ambulatory and able to carry out work of a light or sedentary nature. For example, light housework, office work)  2 - Symptomatic, <50% in bed during the day (Ambulatory and capable of all self care but unable to carry out any work activities. Up and about more than 50% of waking hours)  3 - Symptomatic, >50% in bed, but not bedbound (Capable of only limited self-care, confined to bed or chair 50% or more of waking hours)  4 - Bedbound (Completely disabled. Cannot carry on any self-care. Totally confined to bed or chair)  5 - Death   Eustace Pen MM, Creech RH, Tormey DC, et al. [phone]). "Toxicity and response criteria of the [facility]". [place] Oncol. 5 (6): 649-55   LABORATORY DATA:  Lab Results  Component Value Date   WBC 6.3 01/20/2015   HGB 8.8* 01/20/2015   HCT 28.9* 01/20/2015   MCV 68.7* 01/20/2015   PLT 278 01/20/2015   CMP     Component Value Date/Time   NA 143 01/20/2015 0828   NA 140 09/04/2014 1532   K 3.8 01/20/2015 0828   K 3.7 09/04/2014 1532   CL 105 09/04/2014 1532   CO2 24 01/20/2015 0828   CO2 25 09/04/2014 1532  GLUCOSE 92 01/20/2015 0828   GLUCOSE 103* 09/04/2014 1532   BUN 6.7* 01/20/2015 0828   BUN 7 09/04/2014 1532   CREATININE 0.6 01/20/2015 0828   CREATININE 0.58 09/04/2014 1532   CALCIUM 9.2 01/20/2015 0828   CALCIUM 9.5 09/04/2014 1532   PROT 6.1* 01/20/2015 0828   ALBUMIN 3.7 01/20/2015 0828   AST 16 01/20/2015 0828   ALT 8 01/20/2015 0828   ALKPHOS 103 01/20/2015 0828   BILITOT 0.36 01/20/2015 0828   GFRNONAA >90 09/04/2014 1532   GFRAA >90 09/04/2014 1532        RADIOGRAPHY: No results found.    IMPRESSION/PLAN:  It was a pleasure meeting the patient today. We discussed the risks, benefits, and side effects of radiotherapy.  The goal is to improve her likelihood of local regional control and overall survival. We discussed that radiation  would take approximately 6 weeks to complete. I plan to treat her left chest wall and regional nodes in the left axilla, SCV region, and internal mammary region.  We spoke about acute effects including skin irritation and fatigue as well as  less common late effects including lung and heart irritation. We spoke about the latest technology that is used to minimize the risk of late effects for breast cancer patients undergoing radiotherapy. No guarantees of treatment were given. The patient is enthusiastic about proceeding with treatment. I look forward to participating in the patient's care.  Plan for simulation on 02-03-15.  __________________________________________   Eppie Gibson, MD

## 2015-01-29 ENCOUNTER — Ambulatory Visit
Admission: RE | Admit: 2015-01-29 | Discharge: 2015-01-29 | Disposition: A | Discharge status: Home / Self Care | Payer: Self-pay | Source: Ambulatory Visit | Attending: Radiation Oncology | Admitting: Radiation Oncology

## 2015-01-29 ENCOUNTER — Encounter: Payer: Self-pay | Admitting: Radiation Oncology

## 2015-01-29 VITALS — BP 92/63 | HR 86 | Temp 98.0°F | Resp 18 | Ht 60.0 in | Wt 101.4 lb

## 2015-01-29 DIAGNOSIS — L599 Disorder of the skin and subcutaneous tissue related to radiation, unspecified: Secondary | ICD-10-CM | POA: Insufficient documentation

## 2015-01-29 DIAGNOSIS — C50512 Malignant neoplasm of lower-outer quadrant of left female breast: Secondary | ICD-10-CM | POA: Insufficient documentation

## 2015-01-29 DIAGNOSIS — Z170 Estrogen receptor positive status [ER+]: Secondary | ICD-10-CM | POA: Insufficient documentation

## 2015-01-29 DIAGNOSIS — Z510 Encounter for antineoplastic radiation therapy: Secondary | ICD-10-CM | POA: Insufficient documentation

## 2015-01-29 DIAGNOSIS — D0512 Intraductal carcinoma in situ of left breast: Secondary | ICD-10-CM | POA: Insufficient documentation

## 2015-01-29 DIAGNOSIS — C778 Secondary and unspecified malignant neoplasm of lymph nodes of multiple regions: Secondary | ICD-10-CM | POA: Insufficient documentation

## 2015-01-29 DIAGNOSIS — R1110 Vomiting, unspecified: Secondary | ICD-10-CM | POA: Insufficient documentation

## 2015-01-29 DIAGNOSIS — Z79899 Other long term (current) drug therapy: Secondary | ICD-10-CM | POA: Insufficient documentation

## 2015-01-29 DIAGNOSIS — Z9012 Acquired absence of left breast and nipple: Secondary | ICD-10-CM | POA: Insufficient documentation

## 2015-01-29 NOTE — Progress Notes (Signed)
Please see the Nurse Progress Note in the MD Initial Consult Encounter for this patient. 

## 2015-01-30 ENCOUNTER — Ambulatory Visit (HOSPITAL_COMMUNITY): Payer: Self-pay

## 2015-02-03 ENCOUNTER — Ambulatory Visit
Admission: RE | Admit: 2015-02-03 | Discharge: 2015-02-03 | Disposition: A | Discharge status: Home / Self Care | Payer: Self-pay | Source: Ambulatory Visit | Attending: Radiation Oncology | Admitting: Radiation Oncology

## 2015-02-03 DIAGNOSIS — C50512 Malignant neoplasm of lower-outer quadrant of left female breast: Secondary | ICD-10-CM

## 2015-02-03 NOTE — Progress Notes (Signed)
  Radiation Oncology         [phone] ________________________________  Name: [name] MRN: [id number]  Date: 02/03/2015  DOB: 09/28/1986  SIMULATION AND TREATMENT PLANNING NOTE  Outpatient  DIAGNOSIS:     ICD-9-CM ICD-10-CM   1. Breast cancer of lower-outer quadrant of left female breast 174.5 C50.512     NARRATIVE:  The patient was brought to the [facility].  Identity was confirmed.  All relevant records and images related to the planned course of therapy were reviewed.  The patient freely provided informed written consent to proceed with treatment after reviewing the details related to the planned course of therapy. The consent form was witnessed and verified by the simulation staff.    Then, the patient was set-up in a stable reproducible  supine position for radiation therapy.  CT images were obtained.  Surface markings were placed.  The CT images were loaded into the planning software.      RESPIRATORY MOTION MANAGEMENT SIMULATION  NARRATIVE:  In order to account for effect of respiratory motion on target structures and other organs in the planning and delivery of radiotherapy, this patient underwent respiratory motion management simulation.  To accomplish this, when the patient was brought to the CT simulation planning suite, 4D respiratoy motion management CT images were obtained.  The CT images were loaded into the planning software.  Then, using a variety of tools including Cine, MIP, and standard views, the target volume and planning target volumes (PTV) were delineated.  Avoidance structures were contoured.    TREATMENT PLANNING NOTE: Treatment planning then occurred.  The radiation prescription was entered and confirmed.    A total of 1 medically necessary complex treatment devices were fabricated and supervised by me (vaclock).  The number of fields with MLCs will be determined later to shield heart, lungs. I have requested :3D conformal  Radiotherapy  which is medically necessary for this case for the following reason:  Sparing heart, lungs.  I have ordered: DVH of heart, lungs, esophagus, lumpectomy cavity   The patient will receive 50 Gy in 25 fractions to the left chest wall, SCV, and axillary nodes.   I will try to give 45-50Gy to the internal mammary nodes as well.  This will be followed by a scar boost to the chest wall.  Optical Surface Tracking Plan:  Since intensity modulated radiotherapy (IMRT) and 3D conformal radiation treatment methods are predicated on accurate and precise positioning for treatment, intrafraction motion monitoring is medically necessary to ensure accurate and safe treatment delivery. The ability to quantify intrafraction motion without excessive ionizing radiation dose can only be performed with optical surface tracking. Accordingly, surface imaging offers the opportunity to obtain 3D measurements of patient position throughout IMRT and 3D treatments without excessive radiation exposure. I am ordering optical surface tracking for this patient's upcoming course of radiotherapy.  ________________________________   Reference:  Ursula Alert, J, et al. Surface imaging-based analysis of intrafraction motion for breast radiotherapy patients.Journal of [place], n. 6, nov. 2014. ISSN [id number].  Available at: <http://www.jacmp.org/index.php/jacmp/article/view/4957>.    -----------------------------------  Eppie Gibson, MD

## 2015-02-07 ENCOUNTER — Other Ambulatory Visit: Payer: Self-pay | Admitting: Hematology and Oncology

## 2015-02-10 ENCOUNTER — Ambulatory Visit
Admission: RE | Admit: 2015-02-10 | Discharge: 2015-02-10 | Disposition: A | Discharge status: Home / Self Care | Payer: Self-pay | Source: Ambulatory Visit | Attending: Radiation Oncology | Admitting: Radiation Oncology

## 2015-02-10 ENCOUNTER — Other Ambulatory Visit (HOSPITAL_BASED_OUTPATIENT_CLINIC_OR_DEPARTMENT_OTHER): Payer: Self-pay

## 2015-02-10 ENCOUNTER — Ambulatory Visit (HOSPITAL_BASED_OUTPATIENT_CLINIC_OR_DEPARTMENT_OTHER): Payer: Self-pay

## 2015-02-10 DIAGNOSIS — Z5112 Encounter for antineoplastic immunotherapy: Secondary | ICD-10-CM

## 2015-02-10 DIAGNOSIS — C50512 Malignant neoplasm of lower-outer quadrant of left female breast: Secondary | ICD-10-CM

## 2015-02-10 DIAGNOSIS — C773 Secondary and unspecified malignant neoplasm of axilla and upper limb lymph nodes: Secondary | ICD-10-CM

## 2015-02-10 LAB — CBC WITH DIFFERENTIAL/PLATELET
BASO%: 0.6 % (ref 0.0–2.0)
Basophils Absolute: 0 10*3/uL (ref 0.0–0.1)
EOS ABS: 0 10*3/uL (ref 0.0–0.5)
EOS%: 0 % (ref 0.0–7.0)
HCT: 28.5 % — ABNORMAL LOW (ref 34.8–46.6)
HEMOGLOBIN: 8.9 g/dL — AB (ref 11.6–15.9)
LYMPH%: 29.1 % (ref 14.0–49.7)
MCH: 21.7 pg — AB (ref 25.1–34.0)
MCHC: 31.2 g/dL — ABNORMAL LOW (ref 31.5–36.0)
MCV: 69.3 fL — AB (ref 79.5–101.0)
MONO#: 0.5 10*3/uL (ref 0.1–0.9)
MONO%: 9.6 % (ref 0.0–14.0)
NEUT#: 3.2 10*3/uL (ref 1.5–6.5)
NEUT%: 60.7 % (ref 38.4–76.8)
RBC: 4.11 10*6/uL (ref 3.70–5.45)
RDW: 19.7 % — ABNORMAL HIGH (ref 11.2–14.5)
WBC: 5.3 10*3/uL (ref 3.9–10.3)
lymph#: 1.6 10*3/uL (ref 0.9–3.3)
nRBC: 0 % (ref 0–0)

## 2015-02-10 LAB — COMPREHENSIVE METABOLIC PANEL (CC13)
ALK PHOS: 84 U/L (ref 40–150)
ALT: 16 U/L (ref 0–55)
AST: 17 U/L (ref 5–34)
Albumin: 3.8 g/dL (ref 3.5–5.0)
Anion Gap: 9 mEq/L (ref 3–11)
BUN: 7.7 mg/dL (ref 7.0–26.0)
CO2: 24 mEq/L (ref 22–29)
CREATININE: 0.6 mg/dL (ref 0.6–1.1)
Calcium: 9.1 mg/dL (ref 8.4–10.4)
Chloride: 110 mEq/L — ABNORMAL HIGH (ref 98–109)
Glucose: 88 mg/dl (ref 70–140)
Potassium: 4.1 mEq/L (ref 3.5–5.1)
SODIUM: 144 meq/L (ref 136–145)
Total Bilirubin: 0.53 mg/dL (ref 0.20–1.20)
Total Protein: 6.1 g/dL — ABNORMAL LOW (ref 6.4–8.3)

## 2015-02-10 MED ORDER — HEPARIN SOD (PORK) LOCK FLUSH 100 UNIT/ML IV SOLN
500.0000 [IU] | Freq: Once | INTRAVENOUS | Status: AC | PRN
  Administered 2015-02-10: 500 [IU]
  Filled 2015-02-10: qty 5

## 2015-02-10 MED ORDER — SODIUM CHLORIDE 0.9 % IV SOLN
6.0000 mg/kg | Freq: Once | INTRAVENOUS | Status: AC
  Administered 2015-02-10: 294 mg via INTRAVENOUS
  Filled 2015-02-10: qty 14

## 2015-02-10 MED ORDER — DIPHENHYDRAMINE HCL 25 MG PO CAPS
ORAL_CAPSULE | ORAL | Status: AC
  Filled 2015-02-10: qty 2

## 2015-02-10 MED ORDER — ACETAMINOPHEN 325 MG PO TABS
ORAL_TABLET | ORAL | Status: AC
  Filled 2015-02-10: qty 2

## 2015-02-10 MED ORDER — SODIUM CHLORIDE 0.9 % IJ SOLN
10.0000 mL | INTRAMUSCULAR | Status: DC | PRN
  Administered 2015-02-10: 10 mL
  Filled 2015-02-10: qty 10

## 2015-02-10 MED ORDER — ACETAMINOPHEN 325 MG PO TABS
650.0000 mg | ORAL_TABLET | Freq: Once | ORAL | Status: AC
Start: 2015-02-10 — End: 2015-02-10
  Administered 2015-02-10: 650 mg via ORAL

## 2015-02-10 MED ORDER — SODIUM CHLORIDE 0.9 % IV SOLN
Freq: Once | INTRAVENOUS | Status: AC
  Administered 2015-02-10: 10:00:00 via INTRAVENOUS

## 2015-02-10 MED ORDER — DIPHENHYDRAMINE HCL 25 MG PO CAPS
50.0000 mg | ORAL_CAPSULE | Freq: Once | ORAL | Status: AC
  Administered 2015-02-10: 50 mg via ORAL

## 2015-02-10 NOTE — Patient Instructions (Signed)
[facility] Discharge Instructions for Patients Receiving Chemotherapy  Today you received the following chemotherapy agents Hereptin.   To help prevent nausea and vomiting after your treatment, we encourage you to take your nausea medication as prescribed.   If you develop nausea and vomiting that is not controlled by your nausea medication, call the clinic.   BELOW ARE SYMPTOMS THAT SHOULD BE REPORTED IMMEDIATELY:  *FEVER GREATER THAN 100.5 F  *CHILLS WITH OR WITHOUT FEVER  NAUSEA AND VOMITING THAT IS NOT CONTROLLED WITH YOUR NAUSEA MEDICATION  *UNUSUAL SHORTNESS OF BREATH  *UNUSUAL BRUISING OR BLEEDING  TENDERNESS IN MOUTH AND THROAT WITH OR WITHOUT PRESENCE OF ULCERS  *URINARY PROBLEMS  *BOWEL PROBLEMS  UNUSUAL RASH Items with * indicate a potential emergency and should be followed up as soon as possible.  Feel free to call the clinic you have any questions or concerns. The clinic phone number is [phone].  Please show the [facility] at check-in to the Emergency Department and triage nurse.

## 2015-02-11 ENCOUNTER — Ambulatory Visit: Admission: RE | Admit: 2015-02-11 | Payer: Self-pay | Source: Ambulatory Visit

## 2015-02-11 ENCOUNTER — Ambulatory Visit: Payer: Self-pay

## 2015-02-11 ENCOUNTER — Ambulatory Visit
Admission: RE | Admit: 2015-02-11 | Discharge: 2015-02-11 | Disposition: A | Discharge status: Home / Self Care | Payer: Self-pay | Source: Ambulatory Visit | Attending: Radiation Oncology | Admitting: Radiation Oncology

## 2015-02-12 ENCOUNTER — Other Ambulatory Visit: Payer: Self-pay

## 2015-02-12 ENCOUNTER — Ambulatory Visit (HOSPITAL_BASED_OUTPATIENT_CLINIC_OR_DEPARTMENT_OTHER): Payer: Self-pay | Admitting: Nurse Practitioner

## 2015-02-12 ENCOUNTER — Ambulatory Visit
Admission: RE | Admit: 2015-02-12 | Discharge: 2015-02-12 | Disposition: A | Discharge status: Home / Self Care | Payer: Self-pay | Source: Ambulatory Visit | Attending: Radiation Oncology | Admitting: Radiation Oncology

## 2015-02-12 ENCOUNTER — Other Ambulatory Visit (HOSPITAL_COMMUNITY)
Admission: AD | Admit: 2015-02-12 | Discharge: 2015-02-12 | Disposition: A | Discharge status: Home / Self Care | Payer: Self-pay | Source: Ambulatory Visit | Attending: Hematology and Oncology | Admitting: Hematology and Oncology

## 2015-02-12 ENCOUNTER — Telehealth: Payer: Self-pay | Admitting: Hematology and Oncology

## 2015-02-12 ENCOUNTER — Encounter: Payer: Self-pay | Admitting: Radiation Oncology

## 2015-02-12 VITALS — BP 93/57 | HR 83 | Temp 98.3°F | Resp 16 | Wt 108.9 lb

## 2015-02-12 DIAGNOSIS — C773 Secondary and unspecified malignant neoplasm of axilla and upper limb lymph nodes: Secondary | ICD-10-CM

## 2015-02-12 DIAGNOSIS — C50512 Malignant neoplasm of lower-outer quadrant of left female breast: Secondary | ICD-10-CM | POA: Insufficient documentation

## 2015-02-12 DIAGNOSIS — N898 Other specified noninflammatory disorders of vagina: Secondary | ICD-10-CM

## 2015-02-12 LAB — WET PREP, GENITAL
Clue Cells Wet Prep HPF POC: NONE SEEN
Trich, Wet Prep: NONE SEEN
Yeast Wet Prep HPF POC: NONE SEEN

## 2015-02-12 MED ORDER — PROCHLORPERAZINE MALEATE 10 MG PO TABS
10.0000 mg | ORAL_TABLET | Freq: Once | ORAL | Status: AC
  Administered 2015-02-12: 10 mg via ORAL

## 2015-02-12 MED ORDER — ONDANSETRON HCL 8 MG PO TABS
ORAL_TABLET | ORAL | Status: AC
  Filled 2015-02-12: qty 1

## 2015-02-12 MED ORDER — PROCHLORPERAZINE MALEATE 10 MG PO TABS: 10.0000 mg | ORAL_TABLET | Freq: Four times a day (QID) | ORAL | Status: DC | PRN

## 2015-02-12 MED ORDER — PROCHLORPERAZINE MALEATE 10 MG PO TABS
ORAL_TABLET | ORAL | Status: AC
  Filled 2015-02-12: qty 1

## 2015-02-12 NOTE — Telephone Encounter (Signed)
added appt per pof....per pof pt aware °

## 2015-02-12 NOTE — Progress Notes (Signed)
   Weekly Management Note:  Outpatient  Current Dose:  4 Gy  Projected Dose: 60 Gy   Narrative:  The patient presents for routine under treatment assessment.  CBCT/MVCT images/Port film x-rays were reviewed.  The chart was checked. Doing well but has new yellow white vaginal discharge  Physical Findings:  vitals were not taken for this visit.  Wt Readings from Last 3 Encounters:  01/20/15 109 lb (49.442 kg)  12/30/14 111 lb 1.6 oz (50.395 kg)  12/09/14 110 lb 4.8 oz (50.032 kg)   NAD  Impression:  The patient is tolerating radiotherapy.  Plan:  Continue radiotherapy as planned. Refer to Michel Harrow NP for vaginal discharge assessment.  ________________________________   Eppie Gibson, M.D.

## 2015-02-13 ENCOUNTER — Ambulatory Visit
Admission: RE | Admit: 2015-02-13 | Discharge: 2015-02-13 | Disposition: A | Discharge status: Home / Self Care | Payer: Self-pay | Source: Ambulatory Visit | Attending: Radiation Oncology | Admitting: Radiation Oncology

## 2015-02-14 ENCOUNTER — Ambulatory Visit
Admission: RE | Admit: 2015-02-14 | Discharge: 2015-02-14 | Disposition: A | Discharge status: Home / Self Care | Payer: Self-pay | Source: Ambulatory Visit | Attending: Radiation Oncology | Admitting: Radiation Oncology

## 2015-02-14 ENCOUNTER — Encounter: Payer: Self-pay | Admitting: Nurse Practitioner

## 2015-02-14 ENCOUNTER — Telehealth: Payer: Self-pay | Admitting: Nurse Practitioner

## 2015-02-14 DIAGNOSIS — N898 Other specified noninflammatory disorders of vagina: Secondary | ICD-10-CM | POA: Insufficient documentation

## 2015-02-14 DIAGNOSIS — C50512 Malignant neoplasm of lower-outer quadrant of left female breast: Secondary | ICD-10-CM | POA: Insufficient documentation

## 2015-02-14 LAB — HERPES SIMPLEX VIRUS CULTURE

## 2015-02-14 MED ORDER — ALRA NON-METALLIC DEODORANT (RAD-ONC)
1.0000 "application " | Freq: Once | TOPICAL | Status: AC
  Administered 2015-02-14: 1 via TOPICAL

## 2015-02-14 MED ORDER — RADIAPLEXRX EX GEL
Freq: Once | CUTANEOUS | Status: AC
  Administered 2015-02-14: 11:00:00 via TOPICAL

## 2015-02-14 NOTE — Progress Notes (Signed)
SYMPTOM MANAGEMENT CLINIC   HPI: [name] 29 y.o. female diagnosed with breast cancer.  Patient is status post mastectomy; and completed her final cycle of Taxotere/carboplatinum/Herceptin/Perjeta chemotherapy on 01/20/2015.  Currently undergoing maintenance Herceptin infusions on an every three-week basis; as well as radiation therapy.  C/o vaginal dryness, irritation, and discharge for the past several days.  Patient denies any foul odor. Patient denies any dysuria or other UTI symptoms whatsoever.  Patient has been prescribed Valtrex 500 mg twice daily prophylactically; but the patient states that she is not taking the Valtrex.  HPI  ROS  Past Medical History  Diagnosis Date  . Cancer     left breast  . Anemia   . Malignant neoplasm of breast (female), unspecified site 07/10/14    left, lower outer    Past Surgical History  Procedure Laterality Date  . Cesarean section    . Left breast biopsy    . Breast lumpectomy Left 07/10/2014    Procedure: LEFT BREAST LUMPECTOMY;  Surgeon: Merrie Roof, MD;  Location: [facility];  Service: General;  Laterality: Left;  . Portacath placement Right 09/06/2014    Procedure: INSERTION PORT-A-CATH;  Surgeon: Autumn Messing III, MD;  Location: [facility];  Service: General;  Laterality: Right;  . Mastectomy w/ sentinel node biopsy Left 09/06/2014    Procedure: LEFT MASTECTOMY WITH SENTINEL LYMPH NODE BIOPSY/NODE MAPPING;  Surgeon: Autumn Messing III, MD;  Location: [facility];  Service: General;  Laterality: Left;    has Breast cancer of lower-outer quadrant of left female breast and Vaginal irritation on her problem list.    is allergic to zofran.    Medication List       This list is accurate as of: 02/12/15 11:59 PM.  Always use your most recent med list.               dexamethasone 4 MG tablet  Commonly known as:  DECADRON  Take 2 tablets (8 mg total) by mouth 2 (two) times daily. Start the day before Taxotere.  Then again the day after chemo for 3 days.     diphenhydramine-acetaminophen 25-500 MG Tabs  Commonly known as:  TYLENOL PM  Take 2 tablets by mouth at bedtime as needed.     ibuprofen 600 MG tablet  Commonly known as:  ADVIL,MOTRIN  Take 600 mg by mouth every 6 (six) hours as needed.     lidocaine-prilocaine cream  Commonly known as:  EMLA  Apply to affected area once     LORazepam 0.5 MG tablet  Commonly known as:  ATIVAN  Take 0.5 mg by mouth every 6 (six) hours as needed for anxiety.     methocarbamol 500 MG tablet  Commonly known as:  ROBAXIN  Take 1 tablet (500 mg total) by mouth every 8 (eight) hours as needed for muscle spasms.     multivitamin with minerals Tabs tablet  Take 1 tablet by mouth daily.     ondansetron 8 MG tablet  Commonly known as:  ZOFRAN  Take by mouth every 8 (eight) hours as needed for nausea or vomiting.     oxyCODONE-acetaminophen 5-325 MG per tablet  Commonly known as:  PERCOCET/ROXICET  Take 1 tablet by mouth every 6 (six) hours as needed for severe pain.     pantoprazole 40 MG tablet  Commonly known as:  PROTONIX  Take 1 tablet (40 mg total) by mouth daily.     prochlorperazine 10 MG tablet  Commonly known as:  COMPAZINE  Take 1 tablet (10 mg total) by mouth every 6 (six) hours as needed for nausea or vomiting.     valACYclovir 500 MG tablet  Commonly known as:  VALTREX  Take 1 tablet (500 mg total) by mouth 2 (two) times daily.         PHYSICAL EXAMINATION  Oncology Vitals 02/12/2015 02/10/2015 02/10/2015 01/29/2015 01/21/2015 01/20/2015 12/31/2014  Height - - - 152 cm - 152 cm -  Weight 49.397 kg - - 45.995 kg - 49.442 kg -  Weight (lbs) 108 lbs 14 oz - - 101 lbs 6 oz - 109 lbs -  BMI (kg/m2) - - - 19.8 kg/m2 - 21.29 kg/m2 -  Temp 98.3 - 97.5 98 97.8 98.2 98.3  Pulse 83 72 78 86 83 86 53  Resp 16 - 16 18 - 18 -  SpO2 - - 100 - - 100 -  BSA (m2) - - - 1.4 m2 - 1.45 m2 -   BP Readings from Last 3 Encounters:  02/12/15 93/57    02/10/15 101/57  01/21/15 96/58    Physical Exam  Constitutional: She is oriented to person, place, and time. She appears unhealthy.  HENT:  Head: Normocephalic and atraumatic.  Mouth/Throat: Oropharynx is clear and moist.  Eyes: Conjunctivae and EOM are normal. Pupils are equal, round, and reactive to light. Right eye exhibits no discharge. Left eye exhibits no discharge. No scleral icterus.  Neck: Normal range of motion. Neck supple. No JVD present. No tracheal deviation present. No thyromegaly present.  Cardiovascular: Normal rate, regular rhythm, normal heart sounds and intact distal pulses.   Pulmonary/Chest: Effort normal and breath sounds normal. No respiratory distress. She has no wheezes. She has no rales. She exhibits no tenderness.  Abdominal: Soft. Bowel sounds are normal. She exhibits no distension and no mass. There is no tenderness. There is no rebound and no guarding.  Genitourinary: No vaginal discharge found.  There was no vaginal discharge on exam today.  There was no odor noted.  There were several tiny red lesions to the right labia; with one of these lesions appearing open.  These lesions were very tender on exam.  Musculoskeletal: Normal range of motion. She exhibits no edema or tenderness.  Lymphadenopathy:    She has no cervical adenopathy.  Neurological: She is alert and oriented to person, place, and time. Gait normal.  Skin: Skin is warm and dry. No rash noted. No erythema. No pallor.  Psychiatric: Affect normal.  Nursing note and vitals reviewed.   LABORATORY DATA:. Hospital Outpatient Visit on 02/12/2015  Component Date Value Ref Range Status  . Yeast Wet Prep HPF POC 02/12/2015 NONE SEEN  NONE SEEN Final  . Trich, Wet Prep 02/12/2015 NONE SEEN  NONE SEEN Final  . Clue Cells Wet Prep HPF POC 02/12/2015 NONE SEEN  NONE SEEN Final  . WBC, Wet Prep HPF POC 02/12/2015 MODERATE* NONE SEEN Final  Appointment on 02/10/2015  Component Date Value Ref Range  Status  . WBC 02/10/2015 5.3  3.9 - 10.3 10e3/uL Final  . NEUT# 02/10/2015 3.2  1.5 - 6.5 10e3/uL Final  . HGB 02/10/2015 8.9* 11.6 - 15.9 g/dL Final  . HCT 02/10/2015 28.5* 34.8 - 46.6 % Final  . Platelets 02/10/2015 164 Large platelets present  145 - 400 10e3/uL Final  . MCV 02/10/2015 69.3* 79.5 - 101.0 fL Final  . MCH 02/10/2015 21.7* 25.1 - 34.0 pg Final  . MCHC 02/10/2015 31.2* 31.5 -  36.0 g/dL Final  . RBC 02/10/2015 4.11  3.70 - 5.45 10e6/uL Final  . RDW 02/10/2015 19.7* 11.2 - 14.5 % Final  . lymph# 02/10/2015 1.6  0.9 - 3.3 10e3/uL Final  . MONO# 02/10/2015 0.5  0.1 - 0.9 10e3/uL Final  . Eosinophils Absolute 02/10/2015 0.0  0.0 - 0.5 10e3/uL Final  . Basophils Absolute 02/10/2015 0.0  0.0 - 0.1 10e3/uL Final  . NEUT% 02/10/2015 60.7  38.4 - 76.8 % Final  . LYMPH% 02/10/2015 29.1  14.0 - 49.7 % Final  . MONO% 02/10/2015 9.6  0.0 - 14.0 % Final  . EOS% 02/10/2015 0.0  0.0 - 7.0 % Final  . BASO% 02/10/2015 0.6  0.0 - 2.0 % Final  . nRBC 02/10/2015 0  0 - 0 % Final  . Sodium 02/10/2015 144  136 - 145 mEq/L Final  . Potassium 02/10/2015 4.1  3.5 - 5.1 mEq/L Final  . Chloride 02/10/2015 110* 98 - 109 mEq/L Final  . CO2 02/10/2015 24  22 - 29 mEq/L Final  . Glucose 02/10/2015 88  70 - 140 mg/dl Final  . BUN 02/10/2015 7.7  7.0 - 26.0 mg/dL Final  . Creatinine 02/10/2015 0.6  0.6 - 1.1 mg/dL Final  . Total Bilirubin 02/10/2015 0.53  0.20 - 1.20 mg/dL Final  . Alkaline Phosphatase 02/10/2015 84  40 - 150 U/L Final  . AST 02/10/2015 17  5 - 34 U/L Final  . ALT 02/10/2015 16  0 - 55 U/L Final  . Total Protein 02/10/2015 6.1* 6.4 - 8.3 g/dL Final  . Albumin 02/10/2015 3.8  3.5 - 5.0 g/dL Final  . Calcium 02/10/2015 9.1  8.4 - 10.4 mg/dL Final  . Anion Gap 02/10/2015 9  3 - 11 mEq/L Final  . EGFR 02/10/2015 >90  >90 ml/min/1.73 m2 Final   eGFR is calculated using the CKD-EPI Creatinine Equation (2009)     RADIOGRAPHIC STUDIES: No results found.  ASSESSMENT/PLAN:    Vaginal  irritation C/o vaginal dryness, irritation, and discharge for the past several days.  Patient denies any foul odor. Patient denies any dysuria or other UTI symptoms whatsoever.  On exam-no vaginal discharge noted whatsoever.  Patient was noted to have some very tender, tiny red lesions to the right labia.  One of these lesions did appear to be open to.  Obtained both a vaginal swab specimen and a herpetic swab culture as well.  Patient has been prescribed Valtrex 500 mg twice daily prophylactically; but the patient states that she is not taking the Valtrex.  Advised patient that the herpetic culture will if these vaginal lesions are actually herpetic in nature.  Advised patient to avoid intercourse/sexual contact until all vaginal specimen results are in.  Advised patient with call to review all results as soon as they are available; prior to prescribing any new medications since patient is receiving all of her medications through the financial assistance program here at the [facility].  Of note-translator present for entire exam.     Breast cancer of lower-outer quadrant of left female breast Patient received her final cycle 6 of her Taxotere/carboplatinum/Herceptin/Perjeta chemotherapy regimen on 01/20/2015.  She will continue to receive Herceptin infusions on an every three-week basis.  She'll be due for her next Herceptin infusion on 03/03/2015.  Also, patient has initiated radiation therapy; and will receive her final radiation therapy on 03/24/2015.   Patient stated understanding of all instructions; and was in agreement with this plan of care. The patient knows  to call the clinic with any problems, questions or concerns.   Review/collaboration with Dr. Lindi Adie regarding all aspects of patient's visit today.   Total time spent with patient was 40 minutes;  with greater than 75 percent of that time spent in face to face counseling regarding patient's symptoms,  and coordination of  care and follow up.  Disclaimer: This note was dictated with voice recognition software. Similar sounding words can inadvertently be transcribed and may not be corrected upon review.   Drue Second, NP 02/14/2015

## 2015-02-14 NOTE — Telephone Encounter (Signed)
Reviewed all of patient's most recent negative lab results from her pelvic exam on Wednesday, 02/12/2015.  Advised that patient has no obvious yeast infection, bacterial vaginosis, Trichomonas, or herpes infection.  Also advised the patient may need a gynecological referral if she continues to have recurring symptoms.

## 2015-02-14 NOTE — Telephone Encounter (Signed)
Called and left a voicemail with translator regarding most recent lab results.  Will await call back to arrange further communication with the non-English speaking patient.  Of note-all lab results negative; and patient requires no new medications at this time.

## 2015-02-14 NOTE — Progress Notes (Signed)
Almyra Free, interpreter present with patient today. Patient education completed with patient and her girl friend. She states her husband reads [place]. Gave patient "Radiation and You" booklet with all pertinent information marked and discussed, re: fatigue, skin irritation/care, nutrition, pain. Gave her Radiaplex, Alra with instructions for proper use.  Teach back method used; Almyra Free used teach back with patient re: skin care.   Patient reported her left pinky fingernail is "sore and smells bad". She has  "Gel " on her nails but not this nail. Nail dry, not swollen or red. Advised she soak in warm salt water several times a day; gave her antibiotic ointment, band aids to apply after soaking.  Advised she not apply anything to that nail or go to a nail salon. Reminded her she will see Dr Isidore Moos Monday. Patient verbalized understanding of all above.

## 2015-02-14 NOTE — Assessment & Plan Note (Signed)
Patient received her final cycle 6 of her Taxotere/carboplatinum/Herceptin/Perjeta chemotherapy regimen on 01/20/2015.  She will continue to receive Herceptin infusions on an every three-week basis.  She'll be due for her next Herceptin infusion on 03/03/2015.  Also, patient has initiated radiation therapy; and will receive her final radiation therapy on 03/24/2015.

## 2015-02-14 NOTE — Assessment & Plan Note (Signed)
C/o vaginal dryness, irritation, and discharge for the past several days.  Patient denies any foul odor. Patient denies any dysuria or other UTI symptoms whatsoever.  On exam-no vaginal discharge noted whatsoever.  Patient was noted to have some very tender, tiny red lesions to the right labia.  One of these lesions did appear to be open to.  Obtained both a vaginal swab specimen and a herpetic swab culture as well.  Patient has been prescribed Valtrex 500 mg twice daily prophylactically; but the patient states that she is not taking the Valtrex.  Advised patient that the herpetic culture will if these vaginal lesions are actually herpetic in nature.  Advised patient to avoid intercourse/sexual contact until all vaginal specimen results are in.  Advised patient with call to review all results as soon as they are available; prior to prescribing any new medications since patient is receiving all of her medications through the financial assistance program here at the [facility].  Of note-translator present for entire exam.

## 2015-02-17 ENCOUNTER — Ambulatory Visit
Admission: RE | Admit: 2015-02-17 | Discharge: 2015-02-17 | Disposition: A | Discharge status: Home / Self Care | Payer: Self-pay | Source: Ambulatory Visit | Attending: Radiation Oncology | Admitting: Radiation Oncology

## 2015-02-17 ENCOUNTER — Encounter: Payer: Self-pay | Admitting: Radiation Oncology

## 2015-02-17 VITALS — BP 95/59 | HR 95 | Temp 98.5°F | Resp 18 | Wt 104.2 lb

## 2015-02-17 DIAGNOSIS — C50512 Malignant neoplasm of lower-outer quadrant of left female breast: Secondary | ICD-10-CM

## 2015-02-17 NOTE — Progress Notes (Signed)
   Weekly Management Note:  Outpatient  Current Dose: 10 Gy  Projected Dose: 60 Gy    ICD-9-CM ICD-10-CM   1. Breast cancer of lower-outer quadrant of left female breast 174.5 C50.512     Narrative:  The patient presents for routine under treatment assessment.  CBCT/MVCT images/Port film x-rays were reviewed.  The chart was checked.  Husband present as patient's interpreter today.   She is c/o slightly sore throat and productive cough with yellow phlegm since yesterday. Son has a cold.  Nausea - mild Fingertips and feet hurt   Physical Findings:  weight is 104 lb 3.2 oz (47.265 kg). Her temperature is 98.5 F (36.9 C). Her blood pressure is 95/59 and her pulse is 95. Her respiration is 18.   Wt Readings from Last 3 Encounters:  02/12/15 108 lb 14.4 oz (49.397 kg)  01/20/15 109 lb (49.442 kg)  12/30/14 111 lb 1.6 oz (50.395 kg)   NAD - no obvious skin irritation of left chest  Impression:  The patient is tolerating radiotherapy.  Plan:  Continue radiotherapy as planned. Discussed radiaplex skin care She is c/o slightly sore throat and productive cough with yellow phlegm since yesterday. Son has a cold. She likely has a cold. Advised rest, hydration, good nutrition  Nausea - mild - take antiemetics prn Fingertips and feet hurt - advised this may take months to resolve, likely r/t chemotherapy ________________________________   Eppie Gibson, M.D.

## 2015-02-17 NOTE — Progress Notes (Signed)
Husband present as patient's interpreter today. She is c/o slightly sore throat and productive cough with yellow phlegm since yesterday. She is not taking any OTC. She has box of Azithromycin prescribed by Dr Lindi Adie early March for these same symptoms, she never took the medication, stated she thought is was for her cough. Informed her it is an antibiotic for infection. She will discuss taking this for her current symptoms, with Dr Isidore Moos. She is fatigued, c/o sore fingertips/ nails. Her nails are dark. She states they feel like they'll fall off. She did soak her pinky finger in salt water over the weekend and applied antibiotic ointment. She states it feels better. Advised she may do the same for all her fingernails. She has not begun applying Radiaplex to left breast/chaest wall area. Advised she begin today. She denies loss of appetite.  BP 95/59 mmHg  Pulse 95  Temp(Src) 98.5 F (36.9 C)  Resp 18  Wt 104 lb 3.2 oz (47.265 kg)

## 2015-02-18 ENCOUNTER — Ambulatory Visit
Admission: RE | Admit: 2015-02-18 | Discharge: 2015-02-18 | Disposition: A | Discharge status: Home / Self Care | Payer: Self-pay | Source: Ambulatory Visit | Attending: Radiation Oncology | Admitting: Radiation Oncology

## 2015-02-19 ENCOUNTER — Ambulatory Visit
Admission: RE | Admit: 2015-02-19 | Discharge: 2015-02-19 | Disposition: A | Discharge status: Home / Self Care | Payer: Self-pay | Source: Ambulatory Visit | Attending: Radiation Oncology | Admitting: Radiation Oncology

## 2015-02-20 ENCOUNTER — Ambulatory Visit
Admission: RE | Admit: 2015-02-20 | Discharge: 2015-02-20 | Disposition: A | Discharge status: Home / Self Care | Payer: Self-pay | Source: Ambulatory Visit | Attending: Radiation Oncology | Admitting: Radiation Oncology

## 2015-02-21 ENCOUNTER — Ambulatory Visit
Admission: RE | Admit: 2015-02-21 | Discharge: 2015-02-21 | Disposition: A | Discharge status: Home / Self Care | Payer: Self-pay | Source: Ambulatory Visit | Attending: Radiation Oncology | Admitting: Radiation Oncology

## 2015-02-21 VITALS — BP 88/59 | HR 83 | Temp 97.9°F

## 2015-02-21 DIAGNOSIS — C50512 Malignant neoplasm of lower-outer quadrant of left female breast: Secondary | ICD-10-CM

## 2015-02-21 NOTE — Progress Notes (Signed)
   Weekly Management Note:  Outpatient  Current Dose: 18 Gy  Projected Dose: 60 Gy    ICD-9-CM ICD-10-CM   1. Breast cancer of lower-outer quadrant of left female breast 174.5 C50.512     Narrative:  The patient presents for routine under treatment assessment. Reports swelling, tender,  at left axilla. She has been seen by PT and knows how to do arm exercises to prevent lymphedema   Physical Findings:  temperature is 97.9 F (36.6 C). Her blood pressure is 88/59 and her pulse is 83.   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 No wrist/hand edema. Skin changes mild with no desquamation.    Impression:  The patient is tolerating radiotherapy.  Plan:  Continue radiotherapy as planned. Reassurance given. Ibuprofen prn for tenderness in axilla  This document serves as a record of services personally performed by Eppie Gibson, MD. It was created on her behalf by Pearlie Oyster, a trained medical scribe. The creation of this record is based on the scribe's personal observations and the provider's statements to them. This document has been checked and approved by the attending provider.     ________________________________   Eppie Gibson, M.D.

## 2015-02-21 NOTE — Progress Notes (Signed)
Patient brought to nursing for assessment of left axilla pain "4" and swelling.Informed patient this is not unusual to have some swelling.Area is soft with palpable fluid.Dr.Squire to assess.BP 88/59 mmHg  Pulse 83  Temp(Src) 97.9 F (36.6 C)

## 2015-02-24 ENCOUNTER — Ambulatory Visit
Admission: RE | Admit: 2015-02-24 | Discharge: 2015-02-24 | Disposition: A | Discharge status: Home / Self Care | Payer: Self-pay | Source: Ambulatory Visit | Attending: Radiation Oncology | Admitting: Radiation Oncology

## 2015-02-24 VITALS — BP 88/57 | HR 84 | Temp 97.5°F | Resp 20 | Wt 106.1 lb

## 2015-02-24 DIAGNOSIS — C50512 Malignant neoplasm of lower-outer quadrant of left female breast: Secondary | ICD-10-CM

## 2015-02-24 NOTE — Progress Notes (Signed)
   Weekly Management Note:  Outpatient  Current Dose: 20 Gy  Projected Dose: 60 Gy    ICD-9-CM ICD-10-CM   1. Breast cancer of lower-outer quadrant of left female breast 174.5 C50.512     Narrative:  The patient presents for routine under treatment assessment. Reports swelling, tender,  at left axilla is better. Left neck pain is 4/10 and existed before diagnosis   Physical Findings:  weight is 106 lb 1.6 oz (48.127 kg). Her oral temperature is 97.5 F (36.4 C). Her blood pressure is 88/57 and her pulse is 84. Her respiration is 20.   Wt Readings from Last 3 Encounters:  02/24/15 106 lb 1.6 oz (48.127 kg)  02/12/15 108 lb 14.4 oz (49.397 kg)  01/20/15 109 lb (49.442 kg)     Skin changes over left chest wall mild with no desquamation.  Left Neck not TTP - no cervical adenopathy  Impression:  The patient is tolerating radiotherapy.  Plan:  Continue radiotherapy as planned. ________________________________   Eppie Gibson, M.D.

## 2015-02-24 NOTE — Progress Notes (Signed)
[name], interpreter with patient today. Patient has not been treated yet today. She states she took Tylenol over the weekend for swelling, pain under her left breast/chest wall with no relief. She states "It is still swollen". She states she didn't take Ibuprofen because she doesn't have any. Today she reports pain "that began Saturday" in her left neck that occasionally radiates up to the top of her head. She states she had this pain before her diagnosis, but she never saw a dr for it. She has her left arm over her head for treatments; suggested this may be contributing to her pain. She rates it 4/10 today. She is applying Radiaplex on left breast/chest wall treatment area, no skin changes at this time.  BP 88/57 mmHg  Pulse 84  Temp(Src) 97.5 F (36.4 C) (Oral)  Resp 20  Wt 106 lb 1.6 oz (48.127 kg)

## 2015-02-25 ENCOUNTER — Ambulatory Visit: Payer: Self-pay

## 2015-02-26 ENCOUNTER — Ambulatory Visit
Admission: RE | Admit: 2015-02-26 | Discharge: 2015-02-26 | Disposition: A | Discharge status: Home / Self Care | Payer: Self-pay | Source: Ambulatory Visit | Attending: Radiation Oncology | Admitting: Radiation Oncology

## 2015-02-27 ENCOUNTER — Encounter (HOSPITAL_COMMUNITY): Payer: Self-pay

## 2015-02-27 ENCOUNTER — Ambulatory Visit
Admission: RE | Admit: 2015-02-27 | Discharge: 2015-02-27 | Disposition: A | Discharge status: Home / Self Care | Payer: Self-pay | Source: Ambulatory Visit | Attending: Radiation Oncology | Admitting: Radiation Oncology

## 2015-02-27 ENCOUNTER — Ambulatory Visit (HOSPITAL_COMMUNITY)
Admission: RE | Admit: 2015-02-27 | Discharge: 2015-02-27 | Disposition: A | Discharge status: Home / Self Care | Payer: Self-pay | Source: Ambulatory Visit | Attending: Obstetrics and Gynecology | Admitting: Obstetrics and Gynecology

## 2015-02-27 VITALS — BP 102/60 | Temp 97.8°F | Ht 60.0 in | Wt 106.0 lb

## 2015-02-27 DIAGNOSIS — Z01419 Encounter for gynecological examination (general) (routine) without abnormal findings: Secondary | ICD-10-CM

## 2015-02-27 NOTE — Patient Instructions (Signed)
Let [name]  know [facility] will cover Pap smears every 3 years unless has a history of abnormal Pap smears. Let patient know will follow up with her within the next couple weeks with results of Pap smear by phone. [name] verbalized understanding.  Zakiya Sporrer, Arvil Chaco, RN 1:16 PM

## 2015-02-27 NOTE — Progress Notes (Signed)
No complaints today.  Pap Smear:  Pap smear completed today. Last Pap smear was in 2013 at Dr. Milinda Cave office and normal per patient. Per patient has no history of an abnormal Pap smear. No Pap smear results in EPIC.    Pelvic/Bimanual   Ext Genitalia No lesions, no swelling and no discharge observed on external genitalia.         Vagina Vagina pink and normal texture. No lesions or discharge observed in vagina.          Cervix Cervix is present. Cervix pink and of normal texture. No discharge observed.     Uterus Uterus is present and palpable. Uterus in normal position and normal size.        Adnexae Bilateral ovaries present and palpable. No tenderness on palpation.          Rectovaginal No rectal exam completed today since patient had no rectal complaints. No skin abnormalities observed on exam.

## 2015-02-28 ENCOUNTER — Ambulatory Visit
Admission: RE | Admit: 2015-02-28 | Discharge: 2015-02-28 | Disposition: A | Discharge status: Home / Self Care | Payer: Self-pay | Source: Ambulatory Visit | Attending: Radiation Oncology | Admitting: Radiation Oncology

## 2015-03-03 ENCOUNTER — Other Ambulatory Visit (HOSPITAL_BASED_OUTPATIENT_CLINIC_OR_DEPARTMENT_OTHER): Payer: Self-pay

## 2015-03-03 ENCOUNTER — Ambulatory Visit (HOSPITAL_BASED_OUTPATIENT_CLINIC_OR_DEPARTMENT_OTHER): Payer: Self-pay | Admitting: Hematology and Oncology

## 2015-03-03 ENCOUNTER — Ambulatory Visit
Admission: RE | Admit: 2015-03-03 | Discharge: 2015-03-03 | Disposition: A | Discharge status: Home / Self Care | Payer: Self-pay | Source: Ambulatory Visit | Attending: Radiation Oncology | Admitting: Radiation Oncology

## 2015-03-03 ENCOUNTER — Telehealth: Payer: Self-pay | Admitting: Hematology and Oncology

## 2015-03-03 ENCOUNTER — Ambulatory Visit (HOSPITAL_BASED_OUTPATIENT_CLINIC_OR_DEPARTMENT_OTHER): Payer: Self-pay

## 2015-03-03 ENCOUNTER — Encounter: Payer: Self-pay | Admitting: Radiation Oncology

## 2015-03-03 VITALS — BP 93/56 | HR 76 | Temp 97.5°F | Resp 18 | Ht 60.0 in | Wt 105.2 lb

## 2015-03-03 VITALS — BP 99/53 | HR 100 | Temp 97.9°F | Resp 12 | Wt 105.0 lb

## 2015-03-03 DIAGNOSIS — C773 Secondary and unspecified malignant neoplasm of axilla and upper limb lymph nodes: Secondary | ICD-10-CM

## 2015-03-03 DIAGNOSIS — C50512 Malignant neoplasm of lower-outer quadrant of left female breast: Secondary | ICD-10-CM

## 2015-03-03 DIAGNOSIS — R110 Nausea: Secondary | ICD-10-CM

## 2015-03-03 DIAGNOSIS — Z5111 Encounter for antineoplastic chemotherapy: Secondary | ICD-10-CM

## 2015-03-03 DIAGNOSIS — J029 Acute pharyngitis, unspecified: Secondary | ICD-10-CM

## 2015-03-03 LAB — COMPREHENSIVE METABOLIC PANEL (CC13)
ALT: 10 U/L (ref 0–55)
AST: 13 U/L (ref 5–34)
Albumin: 4 g/dL (ref 3.5–5.0)
Alkaline Phosphatase: 83 U/L (ref 40–150)
Anion Gap: 9 mEq/L (ref 3–11)
BILIRUBIN TOTAL: 0.51 mg/dL (ref 0.20–1.20)
BUN: 8.9 mg/dL (ref 7.0–26.0)
CALCIUM: 9.2 mg/dL (ref 8.4–10.4)
CO2: 24 meq/L (ref 22–29)
Chloride: 109 mEq/L (ref 98–109)
Creatinine: 0.6 mg/dL (ref 0.6–1.1)
EGFR: >90 mL/min/{1.73_m2} (ref >90)
GLUCOSE: 93 mg/dL (ref 70–140)
Potassium: 4.1 mEq/L (ref 3.5–5.1)
Sodium: 142 mEq/L (ref 136–145)
Total Protein: 6.5 g/dL (ref 6.4–8.3)

## 2015-03-03 LAB — CYTOLOGY - PAP

## 2015-03-03 LAB — CBC WITH DIFFERENTIAL/PLATELET
BASO%: 0.9 % (ref 0.0–2.0)
BASOS ABS: 0 10*3/uL (ref 0.0–0.1)
EOS ABS: 0.2 10*3/uL (ref 0.0–0.5)
EOS%: 4.7 % (ref 0.0–7.0)
HEMATOCRIT: 31.9 % — AB (ref 34.8–46.6)
HEMOGLOBIN: 10.2 g/dL — AB (ref 11.6–15.9)
LYMPH#: 1 10*3/uL (ref 0.9–3.3)
LYMPH%: 31.8 % (ref 14.0–49.7)
MCH: 22 pg — ABNORMAL LOW (ref 25.1–34.0)
MCHC: 32 g/dL (ref 31.5–36.0)
MCV: 68.8 fL — ABNORMAL LOW (ref 79.5–101.0)
MONO#: 0.2 10*3/uL (ref 0.1–0.9)
MONO%: 6.6 % (ref 0.0–14.0)
NEUT#: 1.8 10*3/uL (ref 1.5–6.5)
NEUT%: 56 % (ref 38.4–76.8)
PLATELETS: 126 10*3/uL — AB (ref 145–400)
RBC: 4.64 10*6/uL (ref 3.70–5.45)
RDW: 16.7 % — ABNORMAL HIGH (ref 11.2–14.5)
WBC: 3.2 10*3/uL — AB (ref 3.9–10.3)
nRBC: 0 % (ref 0–0)

## 2015-03-03 LAB — TECHNOLOGIST REVIEW

## 2015-03-03 MED ORDER — HEPARIN SOD (PORK) LOCK FLUSH 100 UNIT/ML IV SOLN
500.0000 [IU] | Freq: Once | INTRAVENOUS | Status: AC | PRN
  Administered 2015-03-03: 500 [IU]
  Filled 2015-03-03: qty 5

## 2015-03-03 MED ORDER — SODIUM CHLORIDE 0.9 % IJ SOLN
10.0000 mL | INTRAMUSCULAR | Status: DC | PRN
  Administered 2015-03-03: 10 mL
  Filled 2015-03-03: qty 10

## 2015-03-03 MED ORDER — ACETAMINOPHEN 325 MG PO TABS
ORAL_TABLET | ORAL | Status: AC
  Filled 2015-03-03: qty 2

## 2015-03-03 MED ORDER — TRASTUZUMAB CHEMO INJECTION 440 MG
6.0000 mg/kg | Freq: Once | INTRAVENOUS | Status: AC
  Administered 2015-03-03: 294 mg via INTRAVENOUS
  Filled 2015-03-03: qty 14

## 2015-03-03 MED ORDER — DIPHENHYDRAMINE HCL 25 MG PO CAPS
50.0000 mg | ORAL_CAPSULE | Freq: Once | ORAL | Status: AC
  Administered 2015-03-03: 50 mg via ORAL

## 2015-03-03 MED ORDER — SODIUM CHLORIDE 0.9 % IV SOLN
Freq: Once | INTRAVENOUS | Status: AC
  Administered 2015-03-03: 11:00:00 via INTRAVENOUS

## 2015-03-03 MED ORDER — DIPHENHYDRAMINE HCL 25 MG PO CAPS
ORAL_CAPSULE | ORAL | Status: AC
  Filled 2015-03-03: qty 2

## 2015-03-03 MED ORDER — PROMETHAZINE HCL 25 MG/ML IJ SOLN
12.5000 mg | Freq: Once | INTRAMUSCULAR | Status: AC
  Administered 2015-03-03: 12.5 mg via INTRAVENOUS
  Filled 2015-03-03: qty 1

## 2015-03-03 MED ORDER — ACETAMINOPHEN 325 MG PO TABS
650.0000 mg | ORAL_TABLET | Freq: Once | ORAL | Status: AC
  Administered 2015-03-03: 650 mg via ORAL

## 2015-03-03 NOTE — Assessment & Plan Note (Signed)
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 Positive for lymphovascular invasion, extracapsular tumor extension grade 3 ER 53%, PR 30%, HER-2 positive ratio 5.95, Ki-67 33%, T2, N3, M0 stage IIIc with high-grade DCIS status post left mastectomy 09/06/2014; completed adjuvant chemotherapy 01/20/2015 and currently on Herceptin maintenance and now getting radiation  Chemotherapy summary: Adjuvant chemotherapy with Taxotere, carboplatin, Herceptin and Perjeta started 09/11/2014 completed 01/20/2015; maintenance Herceptin every 3 weeks to be completed October 2016  Chemotoxicity summary: Patient experienced mucositis improved with mouthwash, dehydration, bone pain related to Neulasta, dyspepsia, alopecia, anemia, nausea, vaginal dryness.  Plan: Continue with Herceptin maintenance every 3 weeks, follow-up in 6 weeks Monitoring closely with echocardiograms every 6 months.

## 2015-03-03 NOTE — Progress Notes (Signed)
   Weekly Management Note:  Outpatient  Current Dose: 28 Gy  Projected Dose: 60 Gy    ICD-9-CM ICD-10-CM   1. Breast cancer of lower-outer quadrant of left female breast 174.5 C50.512     Narrative:  The patient presents for routine under treatment assessment. She has had vomiting x <24 hrs.  She was just upstairs in medical oncology for infusion and received compazine which helped.   Physical Findings:  weight is 105 lb (47.628 kg). Her oral temperature is 97.9 F (36.6 C). Her blood pressure is 99/53 and her pulse is 100. Her respiration is 12 and oxygen saturation is 100%.   Wt Readings from Last 3 Encounters:  03/03/15 105 lb (47.628 kg)  03/03/15 105 lb 3.2 oz (47.718 kg)  02/24/15 106 lb 1.6 oz (48.127 kg)     Skin changes over left chest wall/neck/back mild with no desquamation.    Impression:  The patient is tolerating radiotherapy.  Plan:  Continue radiotherapy as planned. Likely that vomiting is due to virus.  Encouraged hydration. ________________________________   Eppie Gibson, M.D.

## 2015-03-03 NOTE — Patient Instructions (Signed)
[facility] Discharge Instructions for Patients Receiving Chemotherapy  Today you received the following chemotherapy agents Herceptin.   To help prevent nausea and vomiting after your treatment, we encourage you to take your nausea medication Compazine 10 mg every 6 hours as needed.  If you develop nausea and vomiting that is not controlled by your nausea medication, call the clinic.   BELOW ARE SYMPTOMS THAT SHOULD BE REPORTED IMMEDIATELY:  *FEVER GREATER THAN 100.5 F  *CHILLS WITH OR WITHOUT FEVER  NAUSEA AND VOMITING THAT IS NOT CONTROLLED WITH YOUR NAUSEA MEDICATION  *UNUSUAL SHORTNESS OF BREATH  *UNUSUAL BRUISING OR BLEEDING  TENDERNESS IN MOUTH AND THROAT WITH OR WITHOUT PRESENCE OF ULCERS  *URINARY PROBLEMS  *BOWEL PROBLEMS  UNUSUAL RASH Items with * indicate a potential emergency and should be followed up as soon as possible.  Feel free to call the clinic you have any questions or concerns. The clinic phone number is [phone].  Please show the [facility] at check-in to the Emergency Department and triage nurse.

## 2015-03-03 NOTE — Progress Notes (Signed)
Patient Care Team: No Pcp Per Patient as PCP - General (General Practice)  DIAGNOSIS: Breast cancer of lower-outer quadrant of left female breast   Staging form: Breast, AJCC 7th Edition     Clinical: No stage assigned - Unsigned     Pathologic: Stage IIIC (T2, N3a, cM0) - Signed by Rulon Eisenmenger, MD on 09/11/2014   SUMMARY OF ONCOLOGIC HISTORY:   Breast cancer of lower-outer quadrant of left female breast   06/06/2014 Imaging Palpable Left breast mass at 12:00 position 1.9 cm, initial biopsy 06/12/2014 revealed fibrocystic changes.   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 The genes tested were ATM, BARD1, BRCA1, BRCA2, BRIP1, CDH1, CHEK2, MRE11A, MUTYH, NBN, NF1, PALB2, PTEN, RAD50, RAD51C, RAD51D, and TP53.   10/04/2014 - 01/20/2015 Chemotherapy Patient was started on adjuvant chemotherapy with Taxotere, Carboplatin, Herceptin, Perjeta.      CHIEF COMPLIANT: Follow-up on Herceptin  INTERVAL HISTORY: [name] is a 29 year old above-mentioned history of left breast cancer and underwent mastectomy and is completed adjuvant chemotherapy with TCH Perjeta. She is currently getting radiation therapy. She complains of nausea issues. She is taking Zofran prior to radiation which is helping. She continues to have small sores in the back of her throat. Complains of painful intercourse.  REVIEW OF SYSTEMS:   Constitutional: Denies fevers, chills or abnormal weight loss Eyes:  Denies blurriness of vision Ears, nose, mouth, throat, and face: Denies mucositis or sore throat Respiratory: Denies cough, dyspnea or wheezes Cardiovascular: Denies palpitation, chest discomfort or lower extremity swelling Gastrointestinal:  Denies nausea, heartburn or change in bowel habits Skin: Denies abnormal skin rashes Lymphatics: Denies new lymphadenopathy or easy bruising Neurological:Denies numbness, tingling or new weaknesses Behavioral/Psych: Mood is stable, no new changes   All other systems were reviewed with the patient and are negative.  I have reviewed the past medical history, past surgical history, social history and family history with the patient and they are unchanged from previous note.  ALLERGIES:  is allergic to zofran.  MEDICATIONS:  Current Outpatient Prescriptions  Medication Sig Dispense Refill  . dexamethasone (DECADRON) 4 MG tablet Take 2 tablets (8 mg total) by mouth 2 (two) times daily. Start the day before Taxotere. Then again the day after chemo for 3 days. 30 tablet 1  . diphenhydramine-acetaminophen (TYLENOL PM) 25-500 MG TABS Take 2 tablets by mouth at bedtime as needed.    . hyaluronate sodium (RADIAPLEXRX) GEL Apply 1 application topically 2 (two) times daily.    Marland Kitchen ibuprofen (ADVIL,MOTRIN) 600 MG tablet Take 600 mg by mouth every 6 (six) hours as needed.    . lidocaine-prilocaine (EMLA) cream Apply to affected area once 30 g 3  . LORazepam (ATIVAN) 0.5 MG tablet Take 0.5 mg by mouth every 6 (six) hours as needed for anxiety.    . methocarbamol (ROBAXIN) 500 MG tablet Take 1 tablet (500 mg total) by mouth every 8 (eight) hours as needed for muscle spasms. (Patient not taking: Reported on 02/27/2015) 20 tablet 0  . Multiple Vitamin (MULTIVITAMIN WITH MINERALS) TABS tablet Take 1  tablet by mouth daily.    . ondansetron (ZOFRAN) 8 MG tablet Take by mouth every 8 (eight) hours as needed for nausea or vomiting.    Marland Kitchen oxyCODONE-acetaminophen  (PERCOCET/ROXICET) 5-325 MG per tablet Take 1 tablet by mouth every 6 (six) hours as needed for severe pain. (Patient not taking: Reported on 02/27/2015) 30 tablet 0  . pantoprazole (PROTONIX) 40 MG tablet Take 1 tablet (40 mg total) by mouth daily. (Patient not taking: Reported on 02/27/2015) 30 tablet 0  . prochlorperazine (COMPAZINE) 10 MG tablet Take 1 tablet (10 mg total) by mouth every 6 (six) hours as needed for nausea or vomiting. 30 tablet 1  . valACYclovir (VALTREX) 500 MG tablet Take 1 tablet (500 mg total) by mouth 2 (two) times daily. 60 tablet 2   No current facility-administered medications for this visit.   Facility-Administered Medications Ordered in Other Visits  Medication Dose Route Frequency Provider Last Rate Last Dose  . sodium chloride 0.9 % injection 10 mL  10 mL Intravenous PRN Nicholas Lose, MD   10 mL at 12/09/14 1632    PHYSICAL EXAMINATION: ECOG PERFORMANCE STATUS: 1 - Symptomatic but completely ambulatory  Filed Vitals:   03/03/15 0948  BP: 93/56  Pulse: 76  Temp: 97.5 F (36.4 C)  Resp: 18   Filed Weights   03/03/15 0948  Weight: 105 lb 3.2 oz (47.718 kg)    GENERAL:alert, no distress and comfortable SKIN: skin color, texture, turgor are normal, no rashes or significant lesions EYES: normal, Conjunctiva are pink and non-injected, sclera clear OROPHARYNX:no exudate, no erythema and lips, buccal mucosa, and tongue normal  NECK: supple, thyroid normal size, non-tender, without nodularity LYMPH:  no palpable lymphadenopathy in the cervical, axillary or inguinal LUNGS: clear to auscultation and percussion with normal breathing effort HEART: regular rate & rhythm and no murmurs and no lower extremity edema ABDOMEN:abdomen soft, non-tender and normal bowel sounds Musculoskeletal:no cyanosis of digits and no clubbing  NEURO: alert & oriented x 3 with fluent speech, no focal motor/sensory deficits  LABORATORY DATA:  I have reviewed the data as listed    Chemistry      Component Value Date/Time   NA 142 03/03/2015 0928   NA 140 09/04/2014 1532   K 4.1 03/03/2015 0928   K 3.7 09/04/2014 1532   CL 105 09/04/2014 1532   CO2 24 03/03/2015 0928   CO2 25 09/04/2014 1532   BUN 8.9 03/03/2015 0928   BUN 7 09/04/2014 1532   CREATININE 0.6 03/03/2015 0928   CREATININE 0.58 09/04/2014 1532      Component Value Date/Time   CALCIUM 9.2 03/03/2015 0928   CALCIUM 9.5 09/04/2014 1532   ALKPHOS 83 03/03/2015 0928   AST 13 03/03/2015 0928   ALT 10 03/03/2015 0928   BILITOT 0.51 03/03/2015 0928       Lab Results  Component Value Date   WBC 3.2* 03/03/2015   HGB 10.2* 03/03/2015   HCT 31.9* 03/03/2015   MCV 68.8* 03/03/2015   PLT 126* 03/03/2015   NEUTROABS 1.8 03/03/2015    ASSESSMENT & PLAN:  Breast cancer of lower-outer quadrant of left female breast Left breast invasive ductal carcinoma multifocal disease with multiple nodules ranging from 0.3 cm up to 3.2 cm and 17/20 lymph nodes positive. Positive for lymphovascular invasion, extracapsular tumor extension grade 3 ER 53%, PR 30%, HER-2 positive ratio 5.95, Ki-67 33%, T2, N3, M0 stage IIIc with high-grade DCIS status post left mastectomy 09/06/2014; completed adjuvant chemotherapy 01/20/2015 and  currently on Herceptin maintenance and now getting radiation  Chemotherapy summary: Adjuvant chemotherapy with Taxotere, carboplatin, Herceptin and Perjeta started 09/11/2014 completed 01/20/2015; maintenance Herceptin every 3 weeks to be completed October 2016  Chemotoxicity summary: Patient experienced mucositis improved with mouthwash, dehydration, bone pain related to Neulasta, dyspepsia, alopecia, anemia, nausea, vaginal dryness.  Plan: Continue with Herceptin maintenance every 3 weeks, follow-up in 6 weeks Once she completes radiation therapy, we will start antiestrogen therapy with tamoxifen. I discussed different options including oophorectomy but I recommended that she at least have her  husband use condoms during she was complaining of painful intercourse. I recommended that she get some supplies of samples for lubrication.  For nails, I recommended coconut oil.  Monitoring closely with echocardiograms every 3 months. Return to clinic in 6 weeks for follow-up.    No orders of the defined types were placed in this encounter.   The patient has a good understanding of the overall plan. she agrees with it. She will call with any problems that may develop before her next visit here.   Rulon Eisenmenger, MD

## 2015-03-03 NOTE — Progress Notes (Signed)
She is currently in no pain. Pt complains of fatigue and weakness, nausea and vomiting started last night.  Took compazine 10mg  to manage.  Nausea and vomiting has diminished.  Pt left breast- positive for Hyperpigmentation and erythema and pruritis.  Pt denies edema. Pt continues to apply Radiaplex as directed. BP 99/53 mmHg  Pulse 100  Temp(Src) 97.9 F (36.6 C) (Oral)  Resp 12  Wt 105 lb (47.628 kg)  SpO2 100%  Orthostatic Standing VS BP-99/53, P-100, POX-100%

## 2015-03-03 NOTE — Telephone Encounter (Signed)
6 week appointment made and patient will get an avs in chemo

## 2015-03-04 ENCOUNTER — Ambulatory Visit
Admission: RE | Admit: 2015-03-04 | Discharge: 2015-03-04 | Disposition: A | Discharge status: Home / Self Care | Payer: Self-pay | Source: Ambulatory Visit | Attending: Radiation Oncology | Admitting: Radiation Oncology

## 2015-03-04 ENCOUNTER — Telehealth (HOSPITAL_COMMUNITY): Payer: Self-pay | Admitting: *Deleted

## 2015-03-04 NOTE — Telephone Encounter (Signed)
Telephoned patient at home # and discussed negative pap smear results. HPV was negative. Next pap smear due in 3 years. Patient voiced understanding. Used interpreter Lavon Paganini.

## 2015-03-05 ENCOUNTER — Ambulatory Visit
Admission: RE | Admit: 2015-03-05 | Discharge: 2015-03-05 | Disposition: A | Discharge status: Home / Self Care | Payer: Self-pay | Source: Ambulatory Visit | Attending: Radiation Oncology | Admitting: Radiation Oncology

## 2015-03-06 ENCOUNTER — Ambulatory Visit
Admission: RE | Admit: 2015-03-06 | Discharge: 2015-03-06 | Disposition: A | Discharge status: Home / Self Care | Payer: Self-pay | Source: Ambulatory Visit | Attending: Radiation Oncology | Admitting: Radiation Oncology

## 2015-03-07 ENCOUNTER — Ambulatory Visit
Admission: RE | Admit: 2015-03-07 | Discharge: 2015-03-07 | Disposition: A | Discharge status: Home / Self Care | Payer: Self-pay | Source: Ambulatory Visit | Attending: Radiation Oncology | Admitting: Radiation Oncology

## 2015-03-07 DIAGNOSIS — C50512 Malignant neoplasm of lower-outer quadrant of left female breast: Secondary | ICD-10-CM

## 2015-03-07 MED ORDER — BIAFINE EX EMUL
CUTANEOUS | Status: DC | PRN
  Administered 2015-03-07: 15:00:00 via TOPICAL

## 2015-03-10 ENCOUNTER — Ambulatory Visit
Admission: RE | Admit: 2015-03-10 | Discharge: 2015-03-10 | Disposition: A | Discharge status: Home / Self Care | Payer: Self-pay | Source: Ambulatory Visit | Attending: Radiation Oncology | Admitting: Radiation Oncology

## 2015-03-10 ENCOUNTER — Encounter: Payer: Self-pay | Admitting: Radiation Oncology

## 2015-03-10 VITALS — BP 94/61 | HR 74 | Temp 97.9°F | Ht 60.0 in | Wt 106.9 lb

## 2015-03-10 DIAGNOSIS — C50512 Malignant neoplasm of lower-outer quadrant of left female breast: Secondary | ICD-10-CM

## 2015-03-10 NOTE — Progress Notes (Signed)
Ms. [name] reports tendreness as a level 7-8/10 in the left chest wall.  Area with combination of erythema and hyperpigmentation at this time.  States more relief since using Biafine. Reports increased fatigue.

## 2015-03-10 NOTE — Progress Notes (Signed)
   Weekly Management Note:  Outpatient  Current Dose: 36 Gy  Projected Dose: 60 Gy    ICD-9-CM ICD-10-CM   1. Breast cancer of lower-outer quadrant of left female breast 174.5 C50.512     Narrative:  The patient presents for routine under treatment assessment. Pt notes pain/swelling in her right eye with no excessive  tear production. Itching of chest wall.  Physical Findings:  height is 5' (1.524 m) and weight is 106 lb 14.4 oz (48.49 kg). Her temperature is 97.9 F (36.6 C). Her blood pressure is 94/61 and her pulse is 74.   Wt Readings from Last 3 Encounters:  03/03/15 105 lb (47.628 kg)  03/03/15 105 lb 3.2 oz (47.718 kg)  02/24/15 106 lb 1.6 oz (48.127 kg)   Hyperpigmentation and dryness over left chest wall.  Impression:  The patient is tolerating radiotherapy.  Plan:  Continue radiotherapy as planned. Recommended 1 % hydrocortisone cream in itching areas.   Not sure about eye swelling (subjective but not obvious on exam).  Rec'd she check w/ med onc if continuing for >1week. ________________________________   Eppie Gibson, M.D.   This document serves as a record of services personally performed by Eppie Gibson, MD. It was created on her behalf by Derek Mound, a trained medical scribe. The creation of this record is based on the scribe's personal observations and the provider's statements to them. This document has been checked and approved by the attending provider.

## 2015-03-11 ENCOUNTER — Ambulatory Visit (HOSPITAL_BASED_OUTPATIENT_CLINIC_OR_DEPARTMENT_OTHER): Payer: Self-pay | Admitting: Nurse Practitioner

## 2015-03-11 ENCOUNTER — Ambulatory Visit
Admission: RE | Admit: 2015-03-11 | Discharge: 2015-03-11 | Disposition: A | Discharge status: Home / Self Care | Payer: Self-pay | Source: Ambulatory Visit | Attending: Radiation Oncology | Admitting: Radiation Oncology

## 2015-03-11 ENCOUNTER — Ambulatory Visit (HOSPITAL_COMMUNITY)
Admission: RE | Admit: 2015-03-11 | Discharge: 2015-03-11 | Disposition: A | Discharge status: Home / Self Care | Payer: Self-pay | Source: Ambulatory Visit | Attending: Nurse Practitioner | Admitting: Nurse Practitioner

## 2015-03-11 ENCOUNTER — Other Ambulatory Visit: Payer: Self-pay | Admitting: *Deleted

## 2015-03-11 ENCOUNTER — Other Ambulatory Visit: Payer: Self-pay | Admitting: Nurse Practitioner

## 2015-03-11 ENCOUNTER — Telehealth: Payer: Self-pay | Admitting: *Deleted

## 2015-03-11 VITALS — BP 91/59 | HR 65 | Temp 97.6°F | Resp 16 | Wt 104.5 lb

## 2015-03-11 DIAGNOSIS — C50512 Malignant neoplasm of lower-outer quadrant of left female breast: Secondary | ICD-10-CM

## 2015-03-11 DIAGNOSIS — H539 Unspecified visual disturbance: Secondary | ICD-10-CM | POA: Insufficient documentation

## 2015-03-11 DIAGNOSIS — R51 Headache: Secondary | ICD-10-CM | POA: Insufficient documentation

## 2015-03-11 DIAGNOSIS — C773 Secondary and unspecified malignant neoplasm of axilla and upper limb lymph nodes: Secondary | ICD-10-CM

## 2015-03-11 DIAGNOSIS — H109 Unspecified conjunctivitis: Secondary | ICD-10-CM

## 2015-03-11 MED ORDER — OXYCODONE-ACETAMINOPHEN 5-325 MG PO TABS
ORAL_TABLET | ORAL | Status: AC
  Filled 2015-03-11: qty 1

## 2015-03-11 MED ORDER — OXYCODONE-ACETAMINOPHEN 5-325 MG PO TABS
1.0000 | ORAL_TABLET | Freq: Once | ORAL | Status: AC
  Administered 2015-03-11: 1 via ORAL

## 2015-03-11 MED ORDER — IOHEXOL 300 MG/ML  SOLN
100.0000 mL | Freq: Once | INTRAMUSCULAR | Status: AC | PRN
  Administered 2015-03-11: 100 mL via INTRAVENOUS

## 2015-03-11 MED ORDER — CEPHALEXIN 500 MG PO CAPS: 500.0000 mg | ORAL_CAPSULE | Freq: Four times a day (QID) | ORAL | Status: DC

## 2015-03-11 MED ORDER — ERYTHROMYCIN 5 MG/GM OP OINT: 1.0000 "application " | TOPICAL_OINTMENT | Freq: Four times a day (QID) | OPHTHALMIC | Status: DC

## 2015-03-11 MED ORDER — OXYCODONE-ACETAMINOPHEN 5-325 MG PO TABS: 1.0000 | ORAL_TABLET | Freq: Four times a day (QID) | ORAL | Status: DC | PRN

## 2015-03-11 NOTE — Telephone Encounter (Signed)
Patient is walk-in to clinic today. C/o right eye pain since Sunday, denies any drainage. Swelling noted to lower lid. Stated that she took 2 Tylenol this am but no relief. pof sent to schedule appt with Selena Lesser today.

## 2015-03-12 ENCOUNTER — Encounter: Payer: Self-pay | Admitting: Radiation Oncology

## 2015-03-12 ENCOUNTER — Ambulatory Visit
Admission: RE | Admit: 2015-03-12 | Discharge: 2015-03-12 | Disposition: A | Discharge status: Home / Self Care | Payer: Self-pay | Source: Ambulatory Visit | Attending: Radiation Oncology | Admitting: Radiation Oncology

## 2015-03-12 NOTE — Progress Notes (Signed)
Electron Holiday representative Note  Diagnosis: Left Breast Cancer   The patient's clinical setup, skin surface, and CT images from her initial simulation were reviewed to plan her boost treatment to her Left chest wall scar.  Measurements were made regarding the size and depth of the targetd. The boost to the scar will be delivered with 6 MeV electrons with 1cm daily bolus; 10 Gy in 5 fractions has been prescribed to the 96% isodose line.   A special port plan was reviewed and approved.  A custom electron cut-out will be used for her boost field.    -----------------------------------  Eppie Gibson, MD

## 2015-03-13 ENCOUNTER — Ambulatory Visit
Admission: RE | Admit: 2015-03-13 | Discharge: 2015-03-13 | Disposition: A | Discharge status: Home / Self Care | Payer: Self-pay | Source: Ambulatory Visit | Attending: Radiation Oncology | Admitting: Radiation Oncology

## 2015-03-13 ENCOUNTER — Encounter: Payer: Self-pay | Admitting: Nurse Practitioner

## 2015-03-13 DIAGNOSIS — H109 Unspecified conjunctivitis: Secondary | ICD-10-CM | POA: Insufficient documentation

## 2015-03-13 NOTE — Progress Notes (Signed)
SYMPTOM MANAGEMENT CLINIC   HPI: [name] 29 y.o. female diagnosed with breast cancer.  Currently undergoing Herceptin infusions; as well as radiation treatments.  Patient is complaining of a 24-hour history of right eye pain, erythema, increased tearing, and edema.  She states that she occasionally sees black spots with the right eye as well.  She is complaining of a mild headache to the right side of her head as well.  She denies any other neurological symptoms whatsoever.  She denies any nausea or vomiting.  She denies any recent fevers or chills.  HPI  ROS  Past Medical History  Diagnosis Date  . Cancer     left breast  . Anemia   . Malignant neoplasm of breast (female), unspecified site 07/10/14    left, lower outer    Past Surgical History  Procedure Laterality Date  . Cesarean section    . Left breast biopsy    . Breast lumpectomy Left 07/10/2014    Procedure: LEFT BREAST LUMPECTOMY;  Surgeon: Merrie Roof, MD;  Location: [place];  Service: General;  Laterality: Left;  . Portacath placement Right 09/06/2014    Procedure: INSERTION PORT-A-CATH;  Surgeon: Autumn Messing III, MD;  Location: [place];  Service: General;  Laterality: Right;  . Mastectomy w/ sentinel node biopsy Left 09/06/2014    Procedure: LEFT MASTECTOMY WITH SENTINEL LYMPH NODE BIOPSY/NODE MAPPING;  Surgeon: Autumn Messing III, MD;  Location: [place];  Service: General;  Laterality: Left;    has Breast cancer of lower-outer quadrant of left female breast; Vaginal irritation; and Conjunctivitis on her problem list.    is allergic to zofran.    Medication List       This list is accurate as of: 03/11/15 11:59 PM.  Always use your most recent med list.               cephALEXin 500 MG capsule  Commonly known as:  KEFLEX  Take 1 capsule (500 mg total) by mouth 4 (four) times daily.     emollient cream  Commonly known as:  BIAFINE  Apply 1 application topically 2 (two)  times daily.     erythromycin ophthalmic ointment  Commonly known as:  ROMYCIN  Place 1 application into the right eye 4 (four) times daily.     ibuprofen 600 MG tablet  Commonly known as:  ADVIL,MOTRIN  Take 600 mg by mouth every 6 (six) hours as needed.     lidocaine-prilocaine cream  Commonly known as:  EMLA  Apply to affected area once     LORazepam 0.5 MG tablet  Commonly known as:  ATIVAN  Take 0.5 mg by mouth every 6 (six) hours as needed for anxiety.     methocarbamol 500 MG tablet  Commonly known as:  ROBAXIN  Take 1 tablet (500 mg total) by mouth every 8 (eight) hours as needed for muscle spasms.     multivitamin with minerals Tabs tablet  Take 1 tablet by mouth daily.     oxyCODONE-acetaminophen 5-325 MG per tablet  Commonly known as:  PERCOCET/ROXICET  Take 1 tablet by mouth every 6 (six) hours as needed for severe pain.     oxyCODONE-acetaminophen 5-325 MG per tablet  Commonly known as:  PERCOCET/ROXICET  Take 1-2 tablets by mouth every 6 (six) hours as needed for severe pain.     pantoprazole 40 MG tablet  Commonly known as:  PROTONIX  Take 1 tablet (40 mg total)  by mouth daily.     prochlorperazine 10 MG tablet  Commonly known as:  COMPAZINE  Take 1 tablet (10 mg total) by mouth every 6 (six) hours as needed for nausea or vomiting.     valACYclovir 500 MG tablet  Commonly known as:  VALTREX  Take 1 tablet (500 mg total) by mouth 2 (two) times daily.         PHYSICAL EXAMINATION  Oncology Vitals 03/11/2015 03/10/2015 03/03/2015 03/03/2015 03/03/2015 02/27/2015 02/24/2015  Height - 152 cm - - 152 cm 152 cm -  Weight 47.401 kg 48.49 kg - 47.628 kg 47.718 kg 48.081 kg 48.127 kg  Weight (lbs) 104 lbs 8 oz 106 lbs 14 oz - 105 lbs 105 lbs 3 oz 106 lbs 106 lbs 2 oz  BMI (kg/m2) - 20.88 kg/m2 - - 20.55 kg/m2 20.7 kg/m2 -  Temp 97.6 97.9 - 97.9 97.5 97.8 97.5  Pulse 65 74 100 78 76 - 84  Resp 16 - - 12 18 - 20  SpO2 100 - 100 100 100 - -  BSA (m2) - 1.43 m2 -  - 1.42 m2 1.43 m2 -   BP Readings from Last 3 Encounters:  03/11/15 91/59  03/03/15 99/53  03/03/15 93/56    Physical Exam  Constitutional: She is oriented to person, place, and time.  Patient appears chronically ill.  HENT:  Head: Normocephalic and atraumatic.  Mouth/Throat: Oropharynx is clear and moist.  Eyes: EOM are normal. Pupils are equal, round, and reactive to light. Right eye exhibits no discharge. Left eye exhibits no discharge. No scleral icterus.  On exam-right eye with erythema to conjunctiva; with increased edema to the right upper eyelid.  There is also some very mild, trace erythema to the area below her right eye as well.  There is no purulent discharge to the right eye.  There is no obvious stye.  No obvious injury or trauma to the eye.   Neck: Normal range of motion. Neck supple.  Pulmonary/Chest: Effort normal. No respiratory distress.  Musculoskeletal: Normal range of motion. She exhibits no edema or tenderness.  Neurological: She is alert and oriented to person, place, and time. Gait normal.  Skin: Skin is warm and dry. No rash noted. There is erythema. There is pallor.  Patient with mild, trace erythema to area directly below her right eye.  Psychiatric: Affect normal.  Nursing note and vitals reviewed.   LABORATORY DATA:. No visits with results within 3 Day(s) from this visit. Latest known visit with results is:  Appointment on 03/03/2015  Component Date Value Ref Range Status  . WBC 03/03/2015 3.2* 3.9 - 10.3 10e3/uL Final  . NEUT# 03/03/2015 1.8  1.5 - 6.5 10e3/uL Final  . HGB 03/03/2015 10.2* 11.6 - 15.9 g/dL Final  . HCT 03/03/2015 31.9* 34.8 - 46.6 % Final  . Platelets 03/03/2015 126* 145 - 400 10e3/uL Final  . MCV 03/03/2015 68.8* 79.5 - 101.0 fL Final  . MCH 03/03/2015 22.0* 25.1 - 34.0 pg Final  . MCHC 03/03/2015 32.0  31.5 - 36.0 g/dL Final  . RBC 03/03/2015 4.64  3.70 - 5.45 10e6/uL Final  . RDW 03/03/2015 16.7* 11.2 - 14.5 % Final  .  lymph# 03/03/2015 1.0  0.9 - 3.3 10e3/uL Final  . MONO# 03/03/2015 0.2  0.1 - 0.9 10e3/uL Final  . Eosinophils Absolute 03/03/2015 0.2  0.0 - 0.5 10e3/uL Final  . Basophils Absolute 03/03/2015 0.0  0.0 - 0.1 10e3/uL Final  . NEUT% 03/03/2015 56.0  38.4 - 76.8 % Final  . LYMPH% 03/03/2015 31.8  14.0 - 49.7 % Final  . MONO% 03/03/2015 6.6  0.0 - 14.0 % Final  . EOS% 03/03/2015 4.7  0.0 - 7.0 % Final  . BASO% 03/03/2015 0.9  0.0 - 2.0 % Final  . nRBC 03/03/2015 0  0 - 0 % Final  . Sodium 03/03/2015 142  136 - 145 mEq/L Final  . Potassium 03/03/2015 4.1  3.5 - 5.1 mEq/L Final  . Chloride 03/03/2015 109  98 - 109 mEq/L Final  . CO2 03/03/2015 24  22 - 29 mEq/L Final  . Glucose 03/03/2015 93  70 - 140 mg/dl Final  . BUN 03/03/2015 8.9  7.0 - 26.0 mg/dL Final  . Creatinine 03/03/2015 0.6  0.6 - 1.1 mg/dL Final  . Total Bilirubin 03/03/2015 0.51  0.20 - 1.20 mg/dL Final  . Alkaline Phosphatase 03/03/2015 83  40 - 150 U/L Final  . AST 03/03/2015 13  5 - 34 U/L Final  . ALT 03/03/2015 10  0 - 55 U/L Final  . Total Protein 03/03/2015 6.5  6.4 - 8.3 g/dL Final  . Albumin 03/03/2015 4.0  3.5 - 5.0 g/dL Final  . Calcium 03/03/2015 9.2  8.4 - 10.4 mg/dL Final  . Anion Gap 03/03/2015 9  3 - 11 mEq/L Final  . EGFR 03/03/2015 >90  >90 ml/min/1.73 m2 Final   eGFR is calculated using the CKD-EPI Creatinine Equation (2009)  . Technologist Review 03/03/2015 mod RBC fragments   Final     RADIOGRAPHIC STUDIES: Ct Head W Wo Contrast  03/11/2015   CLINICAL DATA:  29 year old female with right side headache and visual disturbance since this morning. Current history of breast cancer. Subsequent encounter.  EXAM: CT HEAD WITHOUT AND WITH CONTRAST  TECHNIQUE: Contiguous axial images were obtained from the base of the skull through the vertex without and with intravenous contrast  CONTRAST:  162m OMNIPAQUE IOHEXOL 300 MG/ML  SOLN  COMPARISON:  PET-CT 10/11/2014. Head CT without contrast 08/30/2013.  FINDINGS:  Visualized paranasal sinuses and mastoids are clear. Visualized orbit soft tissues are within normal limits. Visualized scalp soft tissues are within normal limits.  No acute or suspicious osseous lesion.  Cerebral volume remains normal. No ventriculomegaly. Gray-white matter differentiation is within normal limits throughout the brain. No evidence of cortically based acute infarction identified. No acute intracranial hemorrhage identified. No abnormal enhancement identified. Major intracranial vascular structures are enhancing.  IMPRESSION: Normal CT appearance of the brain.  No acute or metastatic intracranial abnormality.   Electronically Signed   By: HGenevie AnnM.D.   On: 03/11/2015 16:57    ASSESSMENT/PLAN:    Breast cancer of lower-outer quadrant of left female breast Patient received her last Herceptin transfusion on 03/03/2015.  She continues to receive daily radiation treatments.  Her final radiation treatment is scheduled for 03/25/2015.  Patient plans to return for labs and her next Herceptin infusion on 03/24/2015.  Her next follow-up visit here at the cancer center is 04/10/2015.   Conjunctivitis Patient is complaining of a 24-hour history of right eye pain, erythema, increased tearing, and edema.  She states that she occasionally sees black spots with the right eye as well.  She is complaining of a mild headache to the right side of her head as well.  She denies any other neurological symptoms whatsoever.  She denies any nausea or vomiting.  She denies any recent fevers or chills.  On exam-right eye with erythema to conjunctiva;  with increased edema to the right upper eyelid.  There is also some very mild, trace erythema to the area below her right eye as well.  There is no purulent discharge to the right eye.  There is no obvious stye.  No obvious injury or trauma to the eye.  Due to patient's complaint of visual disturbance-patient did obtain a CT of the head with and without contrast  this afternoon.  Head CT revealed normal appearance of the brain with no acute or metastatic intracranial abnormality.  Patient requested and was given one Percocet while at the [facility] today.  Also, gave patient a prescription for Percocet to take at home on an as-needed basis.  Patient was given erythromycin ointment for treatment of right eye conjunctivitis; and also given Keflex antibiotics for questionable trace cellulitis to surrounding right eye area.  Patient was also advised should follow-up with a ophthalmologist.  Patient stated she would like to try the antibody ointment and oral antibiotics prior to obtaining a ophthalmologist consultation.  Advised patient would call to check in with her within the next 48 hours to see how she is doing.  Patient was advised to call/return to go directly to the emergency department for any worsening symptoms whatsoever.  Of note-entire visit here at the [facility] today with a translator in attendance.  When patient is call back for follow-up-will need to call patient's cell phone since he is [place] speaking.     Patient stated understanding of all instructions; and was in agreement with this plan of care. The patient knows to call the clinic with any problems, questions or concerns.   Review/collaboration with Dr. Lindi Adie regarding all aspects of patient's visit today.   Total time spent with patient was 40 minutes;  with greater than 75 percent of that time spent in face to face counseling regarding patient's symptoms,  and coordination of care and follow up.  Disclaimer: This note was dictated with voice recognition software. Similar sounding words can inadvertently be transcribed and may not be corrected upon review.   Drue Second, NP 03/13/2015

## 2015-03-13 NOTE — Assessment & Plan Note (Signed)
Patient received her last Herceptin transfusion on 03/03/2015.  She continues to receive daily radiation treatments.  Her final radiation treatment is scheduled for 03/25/2015.  Patient plans to return for labs and her next Herceptin infusion on 03/24/2015.  Her next follow-up visit here at the cancer center is 04/10/2015.

## 2015-03-13 NOTE — Assessment & Plan Note (Addendum)
Patient is complaining of a 24-hour history of right eye pain, erythema, increased tearing, and edema.  She states that she occasionally sees black spots with the right eye as well.  She is complaining of a mild headache to the right side of her head as well.  She denies any other neurological symptoms whatsoever.  She denies any nausea or vomiting.  She denies any recent fevers or chills.  On exam-right eye with erythema to conjunctiva; with increased edema to the right upper eyelid.  There is also some very mild, trace erythema to the area below her right eye as well.  There is no purulent discharge to the right eye.  There is no obvious stye.  No obvious injury or trauma to the eye.  Due to patient's complaint of visual disturbance-patient did obtain a CT of the head with and without contrast this afternoon.  Head CT revealed normal appearance of the brain with no acute or metastatic intracranial abnormality.  Patient requested and was given one Percocet while at the [facility] today.  Also, gave patient a prescription for Percocet to take at home on an as-needed basis.  Patient was given erythromycin ointment for treatment of right eye conjunctivitis; and also given Keflex antibiotics for questionable trace cellulitis to surrounding right eye area.  Patient was also advised should follow-up with a ophthalmologist.  Patient stated she would like to try the antibody ointment and oral antibiotics prior to obtaining a ophthalmologist consultation.  Advised patient would call to check in with her within the next 48 hours to see how she is doing.  Patient was advised to call/return to go directly to the emergency department for any worsening symptoms whatsoever.  Of note-entire visit here at the [facility] today with a translator in attendance.  When patient is call back for follow-up-will need to call patient's cell phone since he is [place] speaking.

## 2015-03-14 ENCOUNTER — Ambulatory Visit
Admission: RE | Admit: 2015-03-14 | Discharge: 2015-03-14 | Disposition: A | Discharge status: Home / Self Care | Payer: Self-pay | Source: Ambulatory Visit | Attending: Radiation Oncology | Admitting: Radiation Oncology

## 2015-03-14 ENCOUNTER — Telehealth: Payer: Self-pay | Admitting: *Deleted

## 2015-03-14 NOTE — Telephone Encounter (Signed)
TC to pt she is feeling better. Her eye pain is healing. She states it is tolerable and she is doing much better.

## 2015-03-17 ENCOUNTER — Ambulatory Visit
Admission: RE | Admit: 2015-03-17 | Discharge: 2015-03-17 | Disposition: A | Discharge status: Home / Self Care | Payer: Self-pay | Source: Ambulatory Visit | Attending: Radiation Oncology | Admitting: Radiation Oncology

## 2015-03-17 ENCOUNTER — Ambulatory Visit: Admission: RE | Admit: 2015-03-17 | Payer: Self-pay | Source: Ambulatory Visit | Admitting: Radiation Oncology

## 2015-03-18 ENCOUNTER — Encounter: Payer: Self-pay | Admitting: Radiation Oncology

## 2015-03-18 ENCOUNTER — Ambulatory Visit
Admission: RE | Admit: 2015-03-18 | Discharge: 2015-03-18 | Disposition: A | Discharge status: Home / Self Care | Payer: Self-pay | Source: Ambulatory Visit | Attending: Radiation Oncology | Admitting: Radiation Oncology

## 2015-03-18 VITALS — BP 96/59 | HR 95 | Resp 16 | Wt 103.6 lb

## 2015-03-18 DIAGNOSIS — C50512 Malignant neoplasm of lower-outer quadrant of left female breast: Secondary | ICD-10-CM | POA: Insufficient documentation

## 2015-03-18 MED ORDER — BIAFINE EX EMUL
Freq: Once | CUTANEOUS | Status: AC
  Administered 2015-03-18: 15:00:00 via TOPICAL

## 2015-03-18 NOTE — Progress Notes (Signed)
Weight and vital stable. Reports mild fatigue. Hyperpigmentation with desquamation of left chest wall noted. Reports mild discomfort to affected area while applying biafine. Also, reports the skin of the left chest wall itches. Additionally, patient reports painful edema of left axilla.

## 2015-03-18 NOTE — Progress Notes (Signed)
   Weekly Management Note:  Outpatient  Current Dose: 50 Gy  Projected Dose: 60 Gy    ICD-9-CM ICD-10-CM   1. Breast cancer of lower-outer quadrant of left female breast 174.5 C50.512 topical emolient (BIAFINE) emulsion    Narrative:  The patient presents for routine under treatment assessment.  Increased skin irritation.  Physical Findings:  weight is 103 lb 9.6 oz (46.993 kg). Her blood pressure is 96/59 and her pulse is 95. Her respiration is 16.   Wt Readings from Last 3 Encounters:  03/11/15 104 lb 8 oz (47.401 kg)  03/03/15 105 lb (47.628 kg)  03/03/15 105 lb 3.2 oz (47.718 kg)   Hyperpigmentation and dryness over left chest wall. No moistness   Impression:  The patient is tolerating radiotherapy.  Plan:  Continue radiotherapy as planned. Gave her more biafine and triple antibiotic packets. ________________________________   Eppie Gibson, M.D.

## 2015-03-18 NOTE — Progress Notes (Signed)
Patient given refill of biafine and samples of triple antibiotic ointment per Dr. Isidore Moos.

## 2015-03-19 ENCOUNTER — Ambulatory Visit
Admission: RE | Admit: 2015-03-19 | Discharge: 2015-03-19 | Disposition: A | Discharge status: Home / Self Care | Payer: Self-pay | Source: Ambulatory Visit | Attending: Radiation Oncology | Admitting: Radiation Oncology

## 2015-03-20 ENCOUNTER — Ambulatory Visit
Admission: RE | Admit: 2015-03-20 | Discharge: 2015-03-20 | Disposition: A | Discharge status: Home / Self Care | Payer: Self-pay | Source: Ambulatory Visit | Attending: Radiation Oncology | Admitting: Radiation Oncology

## 2015-03-20 ENCOUNTER — Ambulatory Visit: Admission: RE | Admit: 2015-03-20 | Payer: Self-pay | Source: Ambulatory Visit | Admitting: Radiation Oncology

## 2015-03-21 ENCOUNTER — Ambulatory Visit
Admission: RE | Admit: 2015-03-21 | Discharge: 2015-03-21 | Disposition: A | Discharge status: Home / Self Care | Payer: Self-pay | Source: Ambulatory Visit | Attending: Radiation Oncology | Admitting: Radiation Oncology

## 2015-03-24 ENCOUNTER — Other Ambulatory Visit (HOSPITAL_BASED_OUTPATIENT_CLINIC_OR_DEPARTMENT_OTHER): Payer: Self-pay

## 2015-03-24 ENCOUNTER — Ambulatory Visit (HOSPITAL_BASED_OUTPATIENT_CLINIC_OR_DEPARTMENT_OTHER): Payer: Self-pay

## 2015-03-24 ENCOUNTER — Ambulatory Visit
Admission: RE | Admit: 2015-03-24 | Discharge: 2015-03-24 | Disposition: A | Discharge status: Home / Self Care | Payer: Self-pay | Source: Ambulatory Visit | Attending: Radiation Oncology | Admitting: Radiation Oncology

## 2015-03-24 VITALS — BP 95/59 | HR 70 | Temp 97.3°F

## 2015-03-24 VITALS — BP 80/49 | HR 100 | Temp 97.7°F | Resp 12 | Wt 105.5 lb

## 2015-03-24 DIAGNOSIS — C50512 Malignant neoplasm of lower-outer quadrant of left female breast: Secondary | ICD-10-CM

## 2015-03-24 DIAGNOSIS — Z5112 Encounter for antineoplastic immunotherapy: Secondary | ICD-10-CM

## 2015-03-24 DIAGNOSIS — C773 Secondary and unspecified malignant neoplasm of axilla and upper limb lymph nodes: Secondary | ICD-10-CM

## 2015-03-24 LAB — COMPREHENSIVE METABOLIC PANEL (CC13)
ALT: 14 U/L (ref 0–55)
ANION GAP: 10 meq/L (ref 3–11)
AST: 15 U/L (ref 5–34)
Albumin: 4.2 g/dL (ref 3.5–5.0)
Alkaline Phosphatase: 86 U/L (ref 40–150)
BILIRUBIN TOTAL: 0.66 mg/dL (ref 0.20–1.20)
BUN: 7.8 mg/dL (ref 7.0–26.0)
CO2: 26 meq/L (ref 22–29)
CREATININE: 0.7 mg/dL (ref 0.6–1.1)
Calcium: 9.6 mg/dL (ref 8.4–10.4)
Chloride: 108 mEq/L (ref 98–109)
Glucose: 96 mg/dl (ref 70–140)
Potassium: 4 mEq/L (ref 3.5–5.1)
Sodium: 144 mEq/L (ref 136–145)
TOTAL PROTEIN: 6.8 g/dL (ref 6.4–8.3)

## 2015-03-24 LAB — CBC WITH DIFFERENTIAL/PLATELET
BASO%: 1.6 % (ref 0.0–2.0)
BASOS ABS: 0 10*3/uL (ref 0.0–0.1)
EOS%: 5.5 % (ref 0.0–7.0)
Eosinophils Absolute: 0.1 10*3/uL (ref 0.0–0.5)
HEMATOCRIT: 33.4 % — AB (ref 34.8–46.6)
HEMOGLOBIN: 10.7 g/dL — AB (ref 11.6–15.9)
LYMPH#: 0.9 10*3/uL (ref 0.9–3.3)
LYMPH%: 35 % (ref 14.0–49.7)
MCH: 21.9 pg — AB (ref 25.1–34.0)
MCHC: 32 g/dL (ref 31.5–36.0)
MCV: 68.3 fL — AB (ref 79.5–101.0)
MONO#: 0.2 10*3/uL (ref 0.1–0.9)
MONO%: 7.9 % (ref 0.0–14.0)
NEUT#: 1.3 10*3/uL — ABNORMAL LOW (ref 1.5–6.5)
NEUT%: 50 % (ref 38.4–76.8)
Platelets: 125 10*3/uL — ABNORMAL LOW (ref 145–400)
RBC: 4.89 10*6/uL (ref 3.70–5.45)
RDW: 14.6 % — ABNORMAL HIGH (ref 11.2–14.5)
WBC: 2.5 10*3/uL — ABNORMAL LOW (ref 3.9–10.3)
nRBC: 0 % (ref 0–0)

## 2015-03-24 MED ORDER — DIPHENHYDRAMINE HCL 25 MG PO CAPS
50.0000 mg | ORAL_CAPSULE | Freq: Once | ORAL | Status: AC
  Administered 2015-03-24: 50 mg via ORAL

## 2015-03-24 MED ORDER — ACETAMINOPHEN 325 MG PO TABS
650.0000 mg | ORAL_TABLET | Freq: Once | ORAL | Status: AC
  Administered 2015-03-24: 650 mg via ORAL

## 2015-03-24 MED ORDER — TRASTUZUMAB CHEMO INJECTION 440 MG
6.0000 mg/kg | Freq: Once | INTRAVENOUS | Status: AC
  Administered 2015-03-24: 294 mg via INTRAVENOUS
  Filled 2015-03-24: qty 14

## 2015-03-24 MED ORDER — HEPARIN SOD (PORK) LOCK FLUSH 100 UNIT/ML IV SOLN
500.0000 [IU] | Freq: Once | INTRAVENOUS | Status: AC | PRN
  Administered 2015-03-24: 500 [IU]
  Filled 2015-03-24: qty 5

## 2015-03-24 MED ORDER — ACETAMINOPHEN 325 MG PO TABS
ORAL_TABLET | ORAL | Status: AC
  Filled 2015-03-24: qty 2

## 2015-03-24 MED ORDER — DIPHENHYDRAMINE HCL 25 MG PO CAPS
ORAL_CAPSULE | ORAL | Status: AC
  Filled 2015-03-24: qty 2

## 2015-03-24 MED ORDER — SODIUM CHLORIDE 0.9 % IJ SOLN
10.0000 mL | INTRAMUSCULAR | Status: DC | PRN
  Administered 2015-03-24: 10 mL
  Filled 2015-03-24: qty 10

## 2015-03-24 MED ORDER — SODIUM CHLORIDE 0.9 % IV SOLN
Freq: Once | INTRAVENOUS | Status: AC
  Administered 2015-03-24: 10:00:00 via INTRAVENOUS

## 2015-03-24 NOTE — Patient Instructions (Signed)
[facility] Discharge Instructions for Patients Receiving Chemotherapy  Today you received the following chemotherapy agents Herceptin  To help prevent nausea and vomiting after your treatment, we encourage you to take your nausea medication    If you develop nausea and vomiting that is not controlled by your nausea medication, call the clinic.   BELOW ARE SYMPTOMS THAT SHOULD BE REPORTED IMMEDIATELY:  *FEVER GREATER THAN 100.5 F  *CHILLS WITH OR WITHOUT FEVER  NAUSEA AND VOMITING THAT IS NOT CONTROLLED WITH YOUR NAUSEA MEDICATION  *UNUSUAL SHORTNESS OF BREATH  *UNUSUAL BRUISING OR BLEEDING  TENDERNESS IN MOUTH AND THROAT WITH OR WITHOUT PRESENCE OF ULCERS  *URINARY PROBLEMS  *BOWEL PROBLEMS  UNUSUAL RASH Items with * indicate a potential emergency and should be followed up as soon as possible.  Feel free to call the clinic you have any questions or concerns. The clinic phone number is [phone].  Please show the CHEMO ALERT CARD at check-in to the Emergency Department and triage nurse.   

## 2015-03-24 NOTE — Progress Notes (Signed)
She is currently in no pain. She does experience pain throughout the, rating pain a 7 over the left breast area, which is burning.  Pt complains of fatigue and loss of sleep .  Pt left breast- positive for Dryness, Hyperpigmentation, Pruritus, erythema, breast tenderness and dry desquamation.  Pt denies edema . Pt continues to apply Biafine as directed. BP 80/49 mmHg  Pulse 100  Temp(Src) 97.7 F (36.5 C) (Oral)  Resp 12  Wt 105 lb 8 oz (47.854 kg)  SpO2 100%

## 2015-03-24 NOTE — Progress Notes (Signed)
   Weekly Management Note:  Outpatient  Current Dose: 58 Gy  Projected Dose: 60 Gy    ICD-9-CM ICD-10-CM   1. Breast cancer of lower-outer quadrant of left female breast 174.5 C50.512     Narrative:  The patient presents for routine under treatment assessment.  Cont'd skin irritation.  Physical Findings:  weight is 105 lb 8 oz (47.854 kg). Her oral temperature is 97.7 F (36.5 C). Her blood pressure is 80/49 and her pulse is 100. Her respiration is 12 and oxygen saturation is 100%.   Wt Readings from Last 3 Encounters:  03/24/15 105 lb 8 oz (47.854 kg)  03/11/15 104 lb 8 oz (47.401 kg)  03/03/15 105 lb (47.628 kg)   Hyperpigmentation and dryness over left chest wall. No moistness  Impression:  The patient is tolerating radiotherapy.  Plan:  Continue radiotherapy as planned.  F/u in 86mo ________________________________   Eppie Gibson, M.D.

## 2015-03-25 ENCOUNTER — Encounter: Payer: Self-pay | Admitting: Radiation Oncology

## 2015-03-25 ENCOUNTER — Ambulatory Visit
Admission: RE | Admit: 2015-03-25 | Discharge: 2015-03-25 | Disposition: A | Discharge status: Home / Self Care | Payer: Self-pay | Source: Ambulatory Visit | Attending: Radiation Oncology | Admitting: Radiation Oncology

## 2015-03-28 NOTE — Progress Notes (Signed)
  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, LOQ left breast  Indication for treatment:  curative       Radiation treatment dates:   02/11/2015-03/25/2015  Site/dose:   1) Left Chest Wall and internal mammary nodes / 50 Gy in 25 fractions 2) Left Supraclavicular fossa / 50 Gy in 25 fractions 3) Left Posterior Axillary boost / 5.075 Gy in 25 fractions (Mid axilla receiving total dose of 50 Gy with boost) 4) Left Chest Wall Scar boost / 10 Gy in 5 fractions  Beams/energy:    1) 3D conformal Opposed tangents /  6 MV photons 2) Right anterior oblique / 10 MV photons 3) PA / 6MV photons 4) En face electrons / 6 MeV electrons  Narrative: The patient tolerated radiation treatment relatively well.  She was treated with a breathhold technique for the first 25 fractions to minimize heart dose.   Plan: The patient has completed radiation treatment. The patient will return to radiation oncology clinic for routine followup in one month. I advised them to call or return sooner if they have any questions or concerns related to their recovery or treatment.  -----------------------------------  Eppie Gibson, MD

## 2015-04-07 ENCOUNTER — Telehealth: Payer: Self-pay | Admitting: Hematology and Oncology

## 2015-04-07 NOTE — Telephone Encounter (Signed)
Patients husband called to ck on herceptin as she is due 5/30,ok per terri to add to 5/26 and she is also check with dr Lindi Adie

## 2015-04-10 ENCOUNTER — Ambulatory Visit (HOSPITAL_BASED_OUTPATIENT_CLINIC_OR_DEPARTMENT_OTHER): Payer: Self-pay | Admitting: Hematology and Oncology

## 2015-04-10 ENCOUNTER — Ambulatory Visit (HOSPITAL_BASED_OUTPATIENT_CLINIC_OR_DEPARTMENT_OTHER): Payer: Self-pay

## 2015-04-10 VITALS — BP 87/48 | HR 77 | Temp 97.5°F | Resp 18 | Ht 60.0 in | Wt 104.2 lb

## 2015-04-10 DIAGNOSIS — C773 Secondary and unspecified malignant neoplasm of axilla and upper limb lymph nodes: Secondary | ICD-10-CM

## 2015-04-10 DIAGNOSIS — D649 Anemia, unspecified: Secondary | ICD-10-CM

## 2015-04-10 DIAGNOSIS — C50512 Malignant neoplasm of lower-outer quadrant of left female breast: Secondary | ICD-10-CM

## 2015-04-10 DIAGNOSIS — D696 Thrombocytopenia, unspecified: Secondary | ICD-10-CM

## 2015-04-10 DIAGNOSIS — Z5112 Encounter for antineoplastic immunotherapy: Secondary | ICD-10-CM

## 2015-04-10 DIAGNOSIS — D72819 Decreased white blood cell count, unspecified: Secondary | ICD-10-CM

## 2015-04-10 MED ORDER — SODIUM CHLORIDE 0.9 % IJ SOLN
10.0000 mL | INTRAMUSCULAR | Status: DC | PRN
  Administered 2015-04-10: 10 mL
  Filled 2015-04-10: qty 10

## 2015-04-10 MED ORDER — SODIUM CHLORIDE 0.9 % IV SOLN
Freq: Once | INTRAVENOUS | Status: AC
  Administered 2015-04-10: 17:00:00 via INTRAVENOUS

## 2015-04-10 MED ORDER — DIPHENHYDRAMINE HCL 25 MG PO CAPS
ORAL_CAPSULE | ORAL | Status: AC
  Filled 2015-04-10: qty 2

## 2015-04-10 MED ORDER — HEPARIN SOD (PORK) LOCK FLUSH 100 UNIT/ML IV SOLN
500.0000 [IU] | Freq: Once | INTRAVENOUS | Status: AC | PRN
  Administered 2015-04-10: 500 [IU]
  Filled 2015-04-10: qty 5

## 2015-04-10 MED ORDER — DIPHENHYDRAMINE HCL 25 MG PO CAPS
50.0000 mg | ORAL_CAPSULE | Freq: Once | ORAL | Status: AC
  Administered 2015-04-10: 50 mg via ORAL

## 2015-04-10 MED ORDER — ACETAMINOPHEN 325 MG PO TABS
ORAL_TABLET | ORAL | Status: AC
  Filled 2015-04-10: qty 2

## 2015-04-10 MED ORDER — ACETAMINOPHEN 325 MG PO TABS
650.0000 mg | ORAL_TABLET | Freq: Once | ORAL | Status: AC
  Administered 2015-04-10: 650 mg via ORAL

## 2015-04-10 MED ORDER — TRASTUZUMAB CHEMO INJECTION 440 MG
6.0000 mg/kg | Freq: Once | INTRAVENOUS | Status: AC
  Administered 2015-04-10: 294 mg via INTRAVENOUS
  Filled 2015-04-10: qty 14

## 2015-04-10 NOTE — Patient Instructions (Signed)
[facility] Discharge Instructions for Patients Receiving Chemotherapy  Today you received the following chemotherapy agents Herceptin  To help prevent nausea and vomiting after your treatment, we encourage you to take your nausea medication    If you develop nausea and vomiting that is not controlled by your nausea medication, call the clinic.   BELOW ARE SYMPTOMS THAT SHOULD BE REPORTED IMMEDIATELY:  *FEVER GREATER THAN 100.5 F  *CHILLS WITH OR WITHOUT FEVER  NAUSEA AND VOMITING THAT IS NOT CONTROLLED WITH YOUR NAUSEA MEDICATION  *UNUSUAL SHORTNESS OF BREATH  *UNUSUAL BRUISING OR BLEEDING  TENDERNESS IN MOUTH AND THROAT WITH OR WITHOUT PRESENCE OF ULCERS  *URINARY PROBLEMS  *BOWEL PROBLEMS  UNUSUAL RASH Items with * indicate a potential emergency and should be followed up as soon as possible.  Feel free to call the clinic you have any questions or concerns. The clinic phone number is [phone].  Please show the CHEMO ALERT CARD at check-in to the Emergency Department and triage nurse.   

## 2015-04-10 NOTE — Progress Notes (Signed)
Patient Care Team: No Pcp Per Patient as PCP - General (General Practice)  DIAGNOSIS: Breast cancer of lower-outer quadrant of left female breast   Staging form: Breast, AJCC 7th Edition     Clinical: No stage assigned - Unsigned     Pathologic: Stage IIIC (T2, N3a, cM0) - Signed by Rulon Eisenmenger, MD on 09/11/2014   SUMMARY OF ONCOLOGIC HISTORY:   Breast cancer of lower-outer quadrant of left female breast   06/06/2014 Imaging Palpable Left breast mass at 12:00 position 1.9 cm, initial biopsy 06/12/2014 revealed fibrocystic changes.   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 The genes tested were ATM, BARD1, BRCA1, BRCA2, BRIP1, CDH1, CHEK2, MRE11A, MUTYH, NBN, NF1, PALB2, PTEN, RAD50, RAD51C, RAD51D, and TP53.   10/04/2014 - 01/20/2015 Chemotherapy Patient was started on adjuvant chemotherapy with Taxotere, Carboplatin, Herceptin, Perjeta.     03/18/2015 -  Radiation Therapy Adjuvant radiation therapy    CHIEF COMPLIANT: Follow-up on Herceptin  INTERVAL HISTORY: [name] is a 29 year old with above-mentioned history of left breast cancer currently on adjuvant radiation therapy she is also getting adjuvant Herceptin. She does not have any problems from Herceptin. She does have some soreness in the breast related to the radiation therapy. She complained of vaginal dryness and I provided her with lubricants which have helped her.  REVIEW OF SYSTEMS:   Constitutional: Denies  fevers, chills or abnormal weight loss Eyes: Denies blurriness of vision Ears, nose, mouth, throat, and face: Denies mucositis or sore throat Respiratory: Denies cough, dyspnea or wheezes Cardiovascular: Denies palpitation, chest discomfort or lower extremity swelling Gastrointestinal:  Denies nausea, heartburn or change in bowel habits Skin: Denies abnormal skin rashes Lymphatics: Denies new lymphadenopathy or easy bruising Neurological:Denies numbness, tingling or new weaknesses Behavioral/Psych: Mood is stable, no new changes  Breast: Soreness and throbbing in the breast from radiation All other systems were reviewed with the patient and are negative.  I have reviewed the past medical history, past surgical history, social history and family history with the patient and they are unchanged from previous note.  ALLERGIES:  is allergic to zofran.  MEDICATIONS:  Current Outpatient Prescriptions  Medication Sig Dispense Refill  . emollient (BIAFINE) cream Apply 1 application topically 2 (two) times daily.    Marland Kitchen ibuprofen (ADVIL,MOTRIN) 600 MG tablet Take 600 mg by mouth every 6 (six) hours as needed.    . lidocaine-prilocaine (EMLA) cream Apply to affected area once 30 g 3  . LORazepam (ATIVAN) 0.5 MG tablet Take 0.5 mg by mouth every 6 (six) hours as needed for anxiety.    . Multiple Vitamin (MULTIVITAMIN WITH MINERALS) TABS tablet Take 1 tablet by mouth daily.    Marland Kitchen oxyCODONE-acetaminophen (PERCOCET/ROXICET) 5-325 MG per tablet Take 1-2 tablets by mouth every 6 (six) hours as needed for severe pain. 30 tablet 0   No current facility-administered medications for this visit.   Facility-Administered Medications Ordered in Other Visits  Medication Dose Route Frequency Provider Last Rate Last Dose  . heparin lock flush 100 unit/mL  500 Units Intracatheter Once PRN  Nicholas Lose, MD      . sodium chloride 0.9 % injection 10 mL  10 mL Intravenous PRN Nicholas Lose, MD   10 mL at 12/09/14 1632   . sodium chloride 0.9 % injection 10 mL  10 mL Intracatheter PRN Nicholas Lose, MD      . trastuzumab (HERCEPTIN) 294 mg in sodium chloride 0.9 % 250 mL chemo infusion  6 mg/kg (Treatment Plan Actual) Intravenous Once Nicholas Lose, MD 528 mL/hr at 04/10/15 1631 294 mg at 04/10/15 1631    PHYSICAL EXAMINATION: ECOG PERFORMANCE STATUS: 1 - Symptomatic but completely ambulatory  Filed Vitals:   04/10/15 1556  BP: 87/48  Pulse: 77  Temp: 97.5 F (36.4 C)  Resp: 18   Filed Weights   04/10/15 1556  Weight: 104 lb 3.2 oz (47.265 kg)    GENERAL:alert, no distress and comfortable SKIN: skin color, texture, turgor are normal, no rashes or significant lesions EYES: normal, Conjunctiva are pink and non-injected, sclera clear OROPHARYNX:no exudate, no erythema and lips, buccal mucosa, and tongue normal  NECK: supple, thyroid normal size, non-tender, without nodularity LYMPH:  no palpable lymphadenopathy in the cervical, axillary or inguinal LUNGS: clear to auscultation and percussion with normal breathing effort HEART: regular rate & rhythm and no murmurs and no lower extremity edema ABDOMEN:abdomen soft, non-tender and normal bowel sounds Musculoskeletal:no cyanosis of digits and no clubbing  NEURO: alert & oriented x 3 with fluent speech, no focal motor/sensory deficits   LABORATORY DATA:  I have reviewed the data as listed   Chemistry      Component Value Date/Time   NA 144 03/24/2015 0906   NA 140 09/04/2014 1532   K 4.0 03/24/2015 0906   K 3.7 09/04/2014 1532   CL 105 09/04/2014 1532   CO2 26 03/24/2015 0906   CO2 25 09/04/2014 1532   BUN 7.8 03/24/2015 0906   BUN 7 09/04/2014 1532   CREATININE 0.7 03/24/2015 0906   CREATININE 0.58 09/04/2014 1532      Component Value Date/Time   CALCIUM 9.6 03/24/2015 0906   CALCIUM 9.5 09/04/2014 1532   ALKPHOS 86 03/24/2015 0906   AST 15 03/24/2015 0906   ALT 14 03/24/2015 0906   BILITOT 0.66 03/24/2015 0906       Lab Results   Component Value Date   WBC 2.5* 03/24/2015   HGB 10.7* 03/24/2015   HCT 33.4* 03/24/2015   MCV 68.3* 03/24/2015   PLT 125* 03/24/2015   NEUTROABS 1.3* 03/24/2015    ASSESSMENT & PLAN:  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 Positive for lymphovascular invasion, extracapsular tumor extension grade 3 ER 53%, PR 30%, HER-2 positive ratio 5.95, Ki-67 33%, T2, N3, M0 stage IIIc with high-grade DCIS status post left mastectomy 09/06/2014; completed adjuvant chemotherapy 01/20/2015 and currently on Herceptin maintenance and now getting radiation  Plan:  1. Continue with Herceptin maintenance every 3 weeks, follow-up in 6 weeks 2. Once she completes radiation therapy, we will start antiestrogen therapy with tamoxifen. I discussed different options including oophorectomy but I recommended that she at least have her husband use condoms.  Anemia and leukopenia and thrombocytopenia: We will recheck CBC with a next follow-up visit Monitoring closely with echocardiograms every 3 months.  Return to clinic in 6 weeks for follow-up.    No orders of the defined types were placed  in this encounter.   The patient has a good understanding of the overall plan. she agrees with it. she will call with any problems that may develop before the next visit here.   Rulon Eisenmenger, MD

## 2015-04-10 NOTE — Assessment & Plan Note (Signed)
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 Positive for lymphovascular invasion, extracapsular tumor extension grade 3 ER 53%, PR 30%, HER-2 positive ratio 5.95, Ki-67 33%, T2, N3, M0 stage IIIc with high-grade DCIS status post left mastectomy 09/06/2014; completed adjuvant chemotherapy 01/20/2015 and currently on Herceptin maintenance and now getting radiation  Plan:  1. Continue with Herceptin maintenance every 3 weeks, follow-up in 6 weeks 2. Once she completes radiation therapy, we will start antiestrogen therapy with tamoxifen. I discussed different options including oophorectomy but I recommended that she at least have her husband use condoms.  Monitoring closely with echocardiograms every 3 months.  Return to clinic in 6 weeks for follow-up.

## 2015-04-23 ENCOUNTER — Other Ambulatory Visit: Payer: Self-pay | Admitting: *Deleted

## 2015-04-23 ENCOUNTER — Telehealth: Payer: Self-pay | Admitting: Hematology and Oncology

## 2015-04-23 DIAGNOSIS — C50512 Malignant neoplasm of lower-outer quadrant of left female breast: Secondary | ICD-10-CM

## 2015-04-23 NOTE — Telephone Encounter (Signed)
Appointments made and a note was placed on her 6/9 dr Isidore Moos appointment to get a new schedule  anne

## 2015-04-25 ENCOUNTER — Ambulatory Visit
Admission: RE | Admit: 2015-04-25 | Discharge: 2015-04-25 | Disposition: A | Discharge status: Home / Self Care | Payer: Self-pay | Source: Ambulatory Visit | Attending: Radiation Oncology | Admitting: Radiation Oncology

## 2015-04-25 ENCOUNTER — Telehealth: Payer: Self-pay | Admitting: Hematology and Oncology

## 2015-04-25 VITALS — BP 90/50 | HR 90 | Temp 98.5°F | Ht 60.0 in | Wt 105.4 lb

## 2015-04-25 DIAGNOSIS — C50512 Malignant neoplasm of lower-outer quadrant of left female breast: Secondary | ICD-10-CM

## 2015-04-25 NOTE — Progress Notes (Signed)
Radiation Oncology         [phone] ________________________________  Name: [name] MRN: [id number]  Date: 04/25/2015  DOB: Feb 19, 1986  Follow-Up Visit Note  outpatient  CC: No PCP Per Patient  No ref. provider found  Diagnosis and Prior Radiotherapy:    ICD-9-CM ICD-10-CM   1. 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 Note dryness and hyperpigmentation in her tx field. She was given more Biafine today. She admits to feeling depressed and tired at times, but is not at risk of harming herself. She started feeling depressed about a week ago and started to cry about everything. She is accompanied with a [place] interpreter. She is more fatigued after radiation. She feels a burning sensation in her back. She hasn't started the tamoxifen. She is scheduled to see Dr. Lindi Adie within ~3-4 weeks.  ALLERGIES:  is allergic to zofran.  Meds: Current Outpatient Prescriptions  Medication Sig Dispense Refill  . acetaminophen (TYLENOL) 500 MG tablet Take 500 mg by mouth every 6 (six) hours as needed.    . lidocaine-prilocaine (EMLA) cream Apply to affected area once 30 g 3  . Multiple Vitamin (MULTIVITAMIN WITH MINERALS) TABS tablet Take 1 tablet by mouth daily.     No current facility-administered medications for this encounter.   Facility-Administered Medications Ordered in Other Encounters    Medication Dose Route Frequency Provider Last Rate Last Dose  . sodium chloride 0.9 % injection 10 mL  10 mL Intravenous PRN Nicholas Lose, MD   10 mL at 12/09/14 1632    Physical Findings: The patient is in no acute distress. Patient is alert and oriented.  height is 5' (1.524 m) and weight is 105 lb 6.4 oz (47.809 kg). Her temperature is 98.5 F (36.9 C). Her blood pressure is 90/50 and her pulse is 90.   Resolving hyperpigmentation over the left chest wall, axilla, lower neck, and upper back. Tenderness to palpation in the left axilla with no obvious masses. No edema in her arms.  Lab Findings: Lab Results  Component Value Date   WBC 2.5* 03/24/2015   HGB 10.7* 03/24/2015   HCT 33.4* 03/24/2015   MCV 68.3* 03/24/2015   PLT 125* 03/24/2015    Radiographic Findings: No results found.  Impression/Plan:  Advised more lotion to be applied to skin for healing. I will send Dr. Lindi Adie  a note to have his folks contact her about the tamoxifen - she is still not taking it  (denies having the pills) though his note implies she should by now.  I explained to the pt that it is common to feel depressed after getting a diagnosis of cancer and treatment for cancer. I would like her to speak with a Education officer, museum and I will reach out to [employer].  I encouraged her to continue followup with medical oncology and surgery. I will see her back on  an as-needed basis. I have encouraged her to call if she has any issues or concerns in the future. I wished her the very best.     This document serves as a record of services personally performed by Eppie Gibson, MD. It was created on her behalf by Darcus Austin, a trained medical scribe. The creation of this record is based on the scribe's personal observations and the provider's statements to them. This document has been checked and approved by the attending provider.     _____________________________________   Eppie Gibson, MD

## 2015-04-25 NOTE — Progress Notes (Signed)
Ms. [name] here for reassesment s/p xrt.  Note dryness and hyperpigmentation in her tx field.  Given more Biafine today.  She admits to feeling depressed and tired at times, but is not at risk of harming herself.  Accompanied [place] interpreter.

## 2015-04-25 NOTE — Telephone Encounter (Signed)
Echo appointment made and placed a note for her to get a a new schedule today

## 2015-05-01 ENCOUNTER — Encounter: Payer: Self-pay | Admitting: Nurse Practitioner

## 2015-05-01 ENCOUNTER — Ambulatory Visit (HOSPITAL_BASED_OUTPATIENT_CLINIC_OR_DEPARTMENT_OTHER): Payer: Self-pay

## 2015-05-01 ENCOUNTER — Other Ambulatory Visit: Payer: Self-pay

## 2015-05-01 ENCOUNTER — Ambulatory Visit (HOSPITAL_BASED_OUTPATIENT_CLINIC_OR_DEPARTMENT_OTHER): Payer: Self-pay | Admitting: Nurse Practitioner

## 2015-05-01 VITALS — BP 85/58 | HR 82 | Temp 98.2°F

## 2015-05-01 DIAGNOSIS — C773 Secondary and unspecified malignant neoplasm of axilla and upper limb lymph nodes: Secondary | ICD-10-CM

## 2015-05-01 DIAGNOSIS — C50512 Malignant neoplasm of lower-outer quadrant of left female breast: Secondary | ICD-10-CM

## 2015-05-01 DIAGNOSIS — L598 Other specified disorders of the skin and subcutaneous tissue related to radiation: Secondary | ICD-10-CM

## 2015-05-01 DIAGNOSIS — Z5112 Encounter for antineoplastic immunotherapy: Secondary | ICD-10-CM

## 2015-05-01 DIAGNOSIS — L589 Radiodermatitis, unspecified: Secondary | ICD-10-CM

## 2015-05-01 MED ORDER — TAMOXIFEN CITRATE 20 MG PO TABS: 20.0000 mg | ORAL_TABLET | Freq: Every day | ORAL | Status: DC

## 2015-05-01 MED ORDER — SODIUM CHLORIDE 0.9 % IJ SOLN
10.0000 mL | INTRAMUSCULAR | Status: DC | PRN
  Administered 2015-05-01: 10 mL
  Filled 2015-05-01: qty 10

## 2015-05-01 MED ORDER — DIPHENHYDRAMINE HCL 25 MG PO CAPS
ORAL_CAPSULE | ORAL | Status: AC
  Filled 2015-05-01: qty 2

## 2015-05-01 MED ORDER — ACETAMINOPHEN 325 MG PO TABS
650.0000 mg | ORAL_TABLET | Freq: Once | ORAL | Status: AC
  Administered 2015-05-01: 650 mg via ORAL

## 2015-05-01 MED ORDER — DIPHENHYDRAMINE HCL 25 MG PO CAPS
50.0000 mg | ORAL_CAPSULE | Freq: Once | ORAL | Status: AC
  Administered 2015-05-01: 50 mg via ORAL

## 2015-05-01 MED ORDER — ACETAMINOPHEN 325 MG PO TABS
ORAL_TABLET | ORAL | Status: AC
  Filled 2015-05-01: qty 2

## 2015-05-01 MED ORDER — SODIUM CHLORIDE 0.9 % IV SOLN
Freq: Once | INTRAVENOUS | Status: AC
  Administered 2015-05-01: 16:00:00 via INTRAVENOUS

## 2015-05-01 MED ORDER — TRASTUZUMAB CHEMO INJECTION 440 MG
6.0000 mg/kg | Freq: Once | INTRAVENOUS | Status: AC
  Administered 2015-05-01: 294 mg via INTRAVENOUS
  Filled 2015-05-01: qty 14

## 2015-05-01 MED ORDER — HEPARIN SOD (PORK) LOCK FLUSH 100 UNIT/ML IV SOLN
500.0000 [IU] | Freq: Once | INTRAVENOUS | Status: AC | PRN
  Administered 2015-05-01: 500 [IU]
  Filled 2015-05-01: qty 5

## 2015-05-01 NOTE — Progress Notes (Signed)
SYMPTOM MANAGEMENT CLINIC   HPI: [name] 29 y.o. female diagnosed with breast cancer.  Patient underwent a left breast mastectomy on 09/06/2014; and completed chemotherapy on 01/20/2015.  She completed radiation therapy on 03/25/2015.  She continues to receive Herceptin infusions on an every three-week basis.  The plan was for the patient to initiate tamoxifen once her radiation treatments had completed; but most recent note per Dr. Isidore Moos radiation oncologist relayed that patient states she does not have the tamoxifen as of yet.   Patient complaining of stinging and tenderness to her left chest wall/left lateral upper rib area at her previous radiation field.  She denies any chest pain, chest pressure, shortness of breath, or pain with inspiration.  She denies any recent fevers or chills.      HPI  ROS  Past Medical History  Diagnosis Date  . Cancer     left breast  . Anemia   . Malignant neoplasm of breast (female), unspecified site 07/10/14    left, lower outer    Past Surgical History  Procedure Laterality Date  . Cesarean section    . Left breast biopsy    . Breast lumpectomy Left 07/10/2014    Procedure: LEFT BREAST LUMPECTOMY;  Surgeon: Merrie Roof, MD;  Location: [facility];  Service: General;  Laterality: Left;  . Portacath placement Right 09/06/2014    Procedure: INSERTION PORT-A-CATH;  Surgeon: Autumn Messing III, MD;  Location: [facility];  Service: General;  Laterality: Right;  . Mastectomy w/ sentinel node biopsy Left 09/06/2014    Procedure: LEFT MASTECTOMY WITH SENTINEL LYMPH NODE BIOPSY/NODE MAPPING;  Surgeon: Autumn Messing III, MD;  Location: [facility];  Service: General;  Laterality: Left;    has Breast cancer of lower-outer quadrant of left female breast; Vaginal irritation; Conjunctivitis; and Radiation dermatitis on her problem list.    is allergic to zofran.    Medication List       This list is accurate as of: 05/01/15   6:04 PM.  Always use your most recent med list.               acetaminophen 500 MG tablet  Commonly known as:  TYLENOL  Take 500 mg by mouth every 6 (six) hours as needed.     lidocaine-prilocaine cream  Commonly known as:  EMLA  Apply to affected area once     multivitamin with minerals Tabs tablet  Take 1 tablet by mouth daily.     tamoxifen 20 MG tablet  Commonly known as:  NOLVADEX  Take 1 tablet (20 mg total) by mouth daily.         PHYSICAL EXAMINATION  Oncology Vitals 05/01/2015 04/25/2015 04/10/2015 03/24/2015 03/24/2015 03/24/2015 03/18/2015  Height - 152 cm 152 cm - - - -  Weight - 47.809 kg 47.265 kg - 47.854 kg - 46.993 kg  Weight (lbs) - 105 lbs 6 oz 104 lbs 3 oz - 105 lbs 8 oz - 103 lbs 10 oz  BMI (kg/m2) - 20.58 kg/m2 20.35 kg/m2 - - - -  Temp 98.2 98.5 97.5 - 97.7 97.3 -  Pulse 82 90 77 100 84 70 95  Resp - - 18 - 12 - 16  SpO2 - - 98 100 100 - -  BSA (m2) - 1.42 m2 1.42 m2 - - - -   BP Readings from Last 3 Encounters:  05/01/15 85/58  04/25/15 90/50  04/10/15 87/48    Physical Exam  Constitutional: She is oriented to person, place, and time and well-developed, well-nourished, and in no distress.  HENT:  Head: Normocephalic and atraumatic.  Eyes: Conjunctivae and EOM are normal. Pupils are equal, round, and reactive to light. Right eye exhibits no discharge. Left eye exhibits no discharge. No scleral icterus.  Neck: Normal range of motion.  Pulmonary/Chest: Effort normal. No respiratory distress. She exhibits no tenderness.  Musculoskeletal: Normal range of motion. She exhibits tenderness. She exhibits no edema.  Left chest wall at mastectomy site and previous radiation field with hyperpigmentation and increased sensitivity with palpation.  No obvious acute erythema, warmth, open skin, or red streaks.  Patient with full range of motion.  Neurological: She is alert and oriented to person, place, and time.  Skin: Skin is warm and dry. No rash noted. No  erythema. No pallor.  Psychiatric: Affect normal.  Nursing note and vitals reviewed.   LABORATORY DATA:. No visits with results within 3 Day(s) from this visit. Latest known visit with results is:  Appointment on 03/24/2015  Component Date Value Ref Range Status  . WBC 03/24/2015 2.5* 3.9 - 10.3 10e3/uL Final  . NEUT# 03/24/2015 1.3* 1.5 - 6.5 10e3/uL Final  . HGB 03/24/2015 10.7* 11.6 - 15.9 g/dL Final  . HCT 03/24/2015 33.4* 34.8 - 46.6 % Final  . Platelets 03/24/2015 125* 145 - 400 10e3/uL Final  . MCV 03/24/2015 68.3* 79.5 - 101.0 fL Final  . MCH 03/24/2015 21.9* 25.1 - 34.0 pg Final  . MCHC 03/24/2015 32.0  31.5 - 36.0 g/dL Final  . RBC 03/24/2015 4.89  3.70 - 5.45 10e6/uL Final  . RDW 03/24/2015 14.6* 11.2 - 14.5 % Final  . lymph# 03/24/2015 0.9  0.9 - 3.3 10e3/uL Final  . MONO# 03/24/2015 0.2  0.1 - 0.9 10e3/uL Final  . Eosinophils Absolute 03/24/2015 0.1  0.0 - 0.5 10e3/uL Final  . Basophils Absolute 03/24/2015 0.0  0.0 - 0.1 10e3/uL Final  . NEUT% 03/24/2015 50.0  38.4 - 76.8 % Final  . LYMPH% 03/24/2015 35.0  14.0 - 49.7 % Final  . MONO% 03/24/2015 7.9  0.0 - 14.0 % Final  . EOS% 03/24/2015 5.5  0.0 - 7.0 % Final  . BASO% 03/24/2015 1.6  0.0 - 2.0 % Final  . nRBC 03/24/2015 0  0 - 0 % Final  . Sodium 03/24/2015 144  136 - 145 mEq/L Final  . Potassium 03/24/2015 4.0  3.5 - 5.1 mEq/L Final  . Chloride 03/24/2015 108  98 - 109 mEq/L Final  . CO2 03/24/2015 26  22 - 29 mEq/L Final  . Glucose 03/24/2015 96  70 - 140 mg/dl Final  . BUN 03/24/2015 7.8  7.0 - 26.0 mg/dL Final  . Creatinine 03/24/2015 0.7  0.6 - 1.1 mg/dL Final  . Total Bilirubin 03/24/2015 0.66  0.20 - 1.20 mg/dL Final  . Alkaline Phosphatase 03/24/2015 86  40 - 150 U/L Final  . AST 03/24/2015 15  5 - 34 U/L Final  . ALT 03/24/2015 14  0 - 55 U/L Final  . Total Protein 03/24/2015 6.8  6.4 - 8.3 g/dL Final  . Albumin 03/24/2015 4.2  3.5 - 5.0 g/dL Final  . Calcium 03/24/2015 9.6  8.4 - 10.4 mg/dL Final    . Anion Gap 03/24/2015 10  3 - 11 mEq/L Final  . EGFR 03/24/2015 >90  >90 ml/min/1.73 m2 Final   eGFR is calculated using the CKD-EPI Creatinine Equation (2009)     RADIOGRAPHIC STUDIES: No results found.  ASSESSMENT/PLAN:    Breast cancer of lower-outer quadrant of left female breast Patient underwent a left breast mastectomy on 09/06/2014; and completed chemotherapy on 01/20/2015.  She completed radiation therapy on 03/25/2015.  She continues to receive Herceptin infusions on an every three-week basis.  The plan was for the patient to initiate tamoxifen once her radiation treatments had completed; but most recent note per Dr. Isidore Moos radiation oncologist relayed that patient states she does not have the tamoxifen as of yet.  Confirmed that the tamoxifen prescription was just called in today.  Will confirm with patient tomorrow that she has obtained the tamoxifen prescription.  Patient is scheduled for an ECHO on 05/05/2015.  Patient is scheduled to return for labs, follow up visit, and her next Herceptin infusion on 05/22/2015.  Off note-translator was present during entire exam.  Radiation dermatitis Patient complaining of stinging and tenderness to her left chest wall/left lateral upper rib area at her previous radiation field.  She denies any chest pain, chest pressure, shortness of breath, or pain with inspiration.  She denies any recent fevers or chills.  On exam.-Left mastectomy site and left lateral upper rib area radiation field with some hyperpigmentation and increased sensitivity; but no acute erythema, warmth, open skin, or red streaks.  Patient observed with full range of motion.  Patient was advised per Dr. Isidore Moos radiation oncologist to try the Biafine to the radiation field to see if that helps.  Advised patient to call/return if her discomfort to this area increases or she develops fevers/chills.  Patient stated understanding of all instructions; and was in agreement  with this plan of care. The patient knows to call the clinic with any problems, questions or concerns.   Review/collaboration with Dr. Lindi Adie regarding all aspects of patient's visit today.   Total time spent with patient was 25 minutes;  with greater than 75 percent of that time spent in face to face counseling regarding patient's symptoms,  and coordination of care and follow up.  Disclaimer: This note was dictated with voice recognition software. Similar sounding words can inadvertently be transcribed and may not be corrected upon review.   Drue Second, NP 05/01/2015

## 2015-05-01 NOTE — Patient Instructions (Signed)
[facility] Discharge Instructions for Patients Receiving Chemotherapy  Today you received the following chemotherapy agents Herceptin  To help prevent nausea and vomiting after your treatment, we encourage you to take your nausea medication    If you develop nausea and vomiting that is not controlled by your nausea medication, call the clinic.   BELOW ARE SYMPTOMS THAT SHOULD BE REPORTED IMMEDIATELY:  *FEVER GREATER THAN 100.5 F  *CHILLS WITH OR WITHOUT FEVER  NAUSEA AND VOMITING THAT IS NOT CONTROLLED WITH YOUR NAUSEA MEDICATION  *UNUSUAL SHORTNESS OF BREATH  *UNUSUAL BRUISING OR BLEEDING  TENDERNESS IN MOUTH AND THROAT WITH OR WITHOUT PRESENCE OF ULCERS  *URINARY PROBLEMS  *BOWEL PROBLEMS  UNUSUAL RASH Items with * indicate a potential emergency and should be followed up as soon as possible.  Feel free to call the clinic you have any questions or concerns. The clinic phone number is [phone].  Please show the CHEMO ALERT CARD at check-in to the Emergency Department and triage nurse.   

## 2015-05-01 NOTE — Assessment & Plan Note (Addendum)
Patient underwent a left breast mastectomy on 09/06/2014; and completed chemotherapy on 01/20/2015.  She completed radiation therapy on 03/25/2015.  She continues to receive Herceptin infusions on an every three-week basis.  The plan was for the patient to initiate tamoxifen once her radiation treatments had completed; but most recent note per Dr. Isidore Moos radiation oncologist relayed that patient states she does not have the tamoxifen as of yet.  Confirmed that the tamoxifen prescription was just called in today.  Will confirm with patient tomorrow that she has obtained the tamoxifen prescription.  Patient is scheduled for an ECHO on 05/05/2015.  Patient is scheduled to return for labs, follow up visit, and her next Herceptin infusion on 05/22/2015.  Off note-translator was present during entire exam.

## 2015-05-01 NOTE — Progress Notes (Signed)
Notified Cyndee, NP of pt with mid axillary region burning, stinging, soreness, and tenderness to palpation. Patient states is sore. Area of hyperpigmentation noted with erythmatous area as well. No fever or chills. Warm to touch. She was her surgeon the day before yesterday and was told it was radiation changes. Cyndee, NP evaluated patient. Instructed patient to monitor for worsening redness, pain, fever, chills and to call if this occurs.

## 2015-05-01 NOTE — Assessment & Plan Note (Signed)
Patient complaining of stinging and tenderness to her left chest wall/left lateral upper rib area at her previous radiation field.  She denies any chest pain, chest pressure, shortness of breath, or pain with inspiration.  She denies any recent fevers or chills.  On exam.-Left mastectomy site and left lateral upper rib area radiation field with some hyperpigmentation and increased sensitivity; but no acute erythema, warmth, open skin, or red streaks.  Patient observed with full range of motion.  Patient was advised per Dr. Isidore Moos radiation oncologist to try the Biafine to the radiation field to see if that helps.  Advised patient to call/return if her discomfort to this area increases or she develops fevers/chills.

## 2015-05-05 ENCOUNTER — Ambulatory Visit (HOSPITAL_COMMUNITY)
Admission: RE | Admit: 2015-05-05 | Discharge: 2015-05-05 | Disposition: A | Discharge status: Home / Self Care | Payer: Self-pay | Source: Ambulatory Visit | Attending: Hematology and Oncology | Admitting: Hematology and Oncology

## 2015-05-05 DIAGNOSIS — Z5111 Encounter for antineoplastic chemotherapy: Secondary | ICD-10-CM

## 2015-05-05 DIAGNOSIS — C50919 Malignant neoplasm of unspecified site of unspecified female breast: Secondary | ICD-10-CM | POA: Insufficient documentation

## 2015-05-05 DIAGNOSIS — C50512 Malignant neoplasm of lower-outer quadrant of left female breast: Secondary | ICD-10-CM

## 2015-05-05 NOTE — Progress Notes (Signed)
  Echocardiogram 2D Echocardiogram has been performed.  [name] 05/05/2015, 4:27 PM

## 2015-05-21 ENCOUNTER — Ambulatory Visit: Payer: Self-pay | Attending: General Surgery | Admitting: Physical Therapy

## 2015-05-21 DIAGNOSIS — I972 Postmastectomy lymphedema syndrome: Secondary | ICD-10-CM | POA: Insufficient documentation

## 2015-05-21 DIAGNOSIS — M25512 Pain in left shoulder: Secondary | ICD-10-CM | POA: Insufficient documentation

## 2015-05-21 DIAGNOSIS — M25612 Stiffness of left shoulder, not elsewhere classified: Secondary | ICD-10-CM | POA: Insufficient documentation

## 2015-05-21 DIAGNOSIS — M7582 Other shoulder lesions, left shoulder: Secondary | ICD-10-CM | POA: Insufficient documentation

## 2015-05-21 NOTE — Patient Instructions (Addendum)
Continue home exercise program with gentle, tolerable stretches that you have done in the past.  Use compression sleeve for exercise and vigorous activity; if swelling increases, use it every day.

## 2015-05-21 NOTE — Therapy (Addendum)
[place], [place], [address] Phone: [phone]   Fax:  [phone]  Physical Therapy Evaluation  Patient Details  Name: [name] MRN: [id number] Date of Birth: 1986-10-13 Referring Provider:  Jovita Kussmaul, MD  Encounter Date: 05/21/2015      PT End of Session - 05/21/15 1615    Visit Number 1   Number of Visits 9   Date for PT Re-Evaluation 06/20/15   PT Start Time 1400   PT Stop Time 1440   PT Time Calculation (min) 40 min   Activity Tolerance Patient tolerated treatment well   Behavior During Therapy [facility] for tasks assessed/performed      Past Medical History  Diagnosis Date  . Cancer     left breast  . Anemia   . Malignant neoplasm of breast (female), unspecified site 07/10/14    left, lower outer    Past Surgical History  Procedure Laterality Date  . Cesarean section    . Left breast biopsy    . Breast lumpectomy Left 07/10/2014    Procedure: LEFT BREAST LUMPECTOMY;  Surgeon: Merrie Roof, MD;  Location: [place] [facility];  Service: General;  Laterality: Left;  . Portacath placement Right 09/06/2014    Procedure: INSERTION PORT-A-CATH;  Surgeon: Autumn Messing III, MD;  Location: [facility] [place];  Service: General;  Laterality: Right;  . Mastectomy w/ sentinel node biopsy Left 09/06/2014    Procedure: LEFT MASTECTOMY WITH SENTINEL LYMPH NODE BIOPSY/NODE MAPPING;  Surgeon: Autumn Messing III, MD;  Location: [place];  Service: General;  Laterality: Left;    There were no vitals filed for this visit.  Visit Diagnosis:  Stiffness of joint, shoulder region, left - Plan: PT plan of care cert/re-cert  Pain in shoulder region, left - Plan: PT plan of care cert/re-cert  Decreased range of motion of left shoulder - Plan: PT plan of care cert/re-cert  Lymphedema syndrome, postmastectomy - Plan: PT plan of care cert/re-cert      Subjective Assessment - 05/21/15 1409    Subjective  Can't lift arm fully and without pain since radiation.   Pertinent History Mastectomy 09/06/14 on left.  20 lymph nodes removed, 17 positive.  Chemotherapy and radiation after that; radiation completed Mar 25, 2015.   Currently in Pain? Yes   Pain Score 4    Pain Location Axilla   Pain Orientation Left   Aggravating Factors  raising the arm   Pain Relieving Factors ibuprofen   Effect of Pain on Daily Activities can't lift things            [facility] PT Assessment - 05/21/15 0001    Assessment   Medical Diagnosis left breast cancer   Onset Date/Surgical Date 09/06/14   Precautions   Precautions Other (comment)  cancer precautions   Restrictions   Weight Bearing Restrictions No   Balance Screen   Has the patient fallen in the past 6 months No   Has the patient had a decrease in activity level because of a fear of falling?  No   Is the patient reluctant to leave their home because of a fear of falling?  No   Home Environment   Living Environment Private residence   Living Arrangements Spouse/significant other;Children  2 children   Type of Home House   Prior Function   Level of [place] works out 1.5 hours (30 mins. treadmill, running, bicycle); also does situps; plans to  do this five days a week, and has just started   Observation/Other Assessments   Skin Integrity incision is well-healed but there is an area superior to incision that has small scabs, patient reports that this is from radiation; area of radiation at left chest and axilla shows darkened skin   Quick DASH  20.45   Posture/Postural Control   Posture/Postural Control Postural limitations   Postural Limitations Rounded Shoulders;Forward head   ROM / Strength   AROM / PROM / Strength PROM;Strength   AROM   Left Shoulder Flexion 120 Degrees   Left Shoulder ABduction 87 Degrees   PROM   Left Shoulder Flexion 137 Degrees   Left Shoulder ABduction 90 Degrees   Left Shoulder Internal  Rotation --  [facility]   Left Shoulder External Rotation 47 Degrees   Strength   Overall Strength Comments left shoulder grossly 3-/5           LYMPHEDEMA/ONCOLOGY QUESTIONNAIRE - 05/21/15 1428    Right Upper Extremity Lymphedema   10 cm Proximal to Olecranon Process 24.2 cm   Olecranon Process 21.5 cm   10 cm Proximal to Ulnar Styloid Process 20.6 cm   Just Proximal to Ulnar Styloid Process 13.8 cm   Across Hand at [employer] 18.5 cm   At [place] of 2nd Digit 5.8 cm   Left Upper Extremity Lymphedema   10 cm Proximal to Olecranon Process 24.6 cm   Olecranon Process 21.4 cm   10 cm Proximal to Ulnar Styloid Process 20 cm   Just Proximal to Ulnar Styloid Process 14 cm   Across Hand at [employer] 17 cm   At [place] of 2nd Digit 5.6 cm           Quick Dash - 05/21/15 0001    Open a tight or new jar No difficulty   Do heavy household chores (wash walls, wash floors) Mild difficulty   Carry a shopping bag or briefcase No difficulty   Wash your back Moderate difficulty   Use a knife to cut food No difficulty   Recreational activities in which you take some force or impact through your arm, shoulder, or hand (golf, hammering, tennis) Moderate difficulty   During the past week, to what extent has your arm, shoulder or hand problem interfered with your normal social activities with family, friends, neighbors, or groups? Not at all   During the past week, to what extent has your arm, shoulder or hand problem limited your work or other regular daily activities Slightly   Arm, shoulder, or hand pain. Moderate   Tingling (pins and needles) in your arm, shoulder, or hand None   Difficulty Sleeping Mild difficulty   DASH Score 20.45 %                             Long Term Clinic Goals - 05/21/15 1621    CC Long Term Goal  #1   Title Active left shoulder flexion at least 150 degrees for improved overhead reach.   Baseline 120 degrees   Time 4   Period Weeks    Status New   CC Long Term Goal  #2   Title Active left shoulder abduction at least 140 degrees for improved ADLs.   Baseline 87 degrees   Time 4   Period Weeks   Status New   CC Long Term Goal  #3   Title Independent with home exercise program.  Time 4   Period Weeks   Status New   CC Long Term Goal  #4   Title Report pain decrease to 2/10 or less.   Time 4   Period Weeks   Status New   CC Long Term Goal  #5   Title left shoulder active external rotation to at least 80 degrees for improved ADLs   Time 4   Period Weeks   Status New            Plan - 05/21/15 1616    Clinical Impression Statement Patient who is known to this clinic from being treated here approx. six months ago returns now, having completed her cancer treatment including mastectomy, chemotherapy, and radiation.  She has decreased left shoulder ROM with discomfort since finishing treatment and would benefit from therapy to improve function.   Pt will benefit from skilled therapeutic intervention in order to improve on the following deficits Decreased range of motion;Impaired UE functional use;Pain   Rehab Potential Excellent   PT Frequency 2x / week   PT Duration 4 weeks   PT Treatment/Interventions Passive range of motion;Manual techniques;Therapeutic exercise;Patient/family education;ADLs/Self Care Home Management   PT Next Visit Plan Begin P/AA/AROM.  Review left shoulder ROM HEP.   PT Home Exercise Plan continue HEP given previously for ROM and do stretches gently   Consulted and Agree with Plan of Care Patient         Problem List Patient Active Problem List   Diagnosis Date Noted  . 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 Conjunctivitis 03/13/2015  . Vaginal irritation 02/14/2015  . Breast cancer of lower-outer quadrant of left female breast 07/26/2014    Graelyn Bihl 05/21/2015, 4:25 PM  [facility], [place],  [address] Phone: [phone]   Fax:  [place], PT 05/21/2015 4:26 PM  PHYSICAL THERAPY DISCHARGE SUMMARY  Visits from Start of Care: 4  Current functional level related to goals / functional outcomes: Goals partially met as noted above.   Remaining deficits: Unknown:  Patient did not continue therapy.  She had been concerned about her financial aid application and wanted to wait until this was settled to call schedule more appointments; she did not reschedule.   Education / Equipment: HEP Plan: Patient agrees to discharge.  Patient goals were partially met. Patient is being discharged due to financial reasons.  ?????    Serafina Royals, PT 12/15/2015 8:38 AM

## 2015-05-22 ENCOUNTER — Ambulatory Visit (HOSPITAL_BASED_OUTPATIENT_CLINIC_OR_DEPARTMENT_OTHER): Payer: Self-pay

## 2015-05-22 ENCOUNTER — Other Ambulatory Visit (HOSPITAL_BASED_OUTPATIENT_CLINIC_OR_DEPARTMENT_OTHER): Payer: Self-pay

## 2015-05-22 ENCOUNTER — Telehealth: Payer: Self-pay | Admitting: Hematology and Oncology

## 2015-05-22 ENCOUNTER — Encounter: Payer: Self-pay | Admitting: Hematology and Oncology

## 2015-05-22 ENCOUNTER — Ambulatory Visit (HOSPITAL_BASED_OUTPATIENT_CLINIC_OR_DEPARTMENT_OTHER): Payer: Self-pay | Admitting: Hematology and Oncology

## 2015-05-22 VITALS — BP 85/53 | HR 74 | Temp 98.4°F | Resp 18 | Ht 60.0 in | Wt 105.7 lb

## 2015-05-22 DIAGNOSIS — C773 Secondary and unspecified malignant neoplasm of axilla and upper limb lymph nodes: Secondary | ICD-10-CM

## 2015-05-22 DIAGNOSIS — C50512 Malignant neoplasm of lower-outer quadrant of left female breast: Secondary | ICD-10-CM

## 2015-05-22 DIAGNOSIS — N898 Other specified noninflammatory disorders of vagina: Secondary | ICD-10-CM

## 2015-05-22 DIAGNOSIS — R110 Nausea: Secondary | ICD-10-CM

## 2015-05-22 DIAGNOSIS — Z5112 Encounter for antineoplastic immunotherapy: Secondary | ICD-10-CM

## 2015-05-22 DIAGNOSIS — D649 Anemia, unspecified: Secondary | ICD-10-CM

## 2015-05-22 DIAGNOSIS — D72819 Decreased white blood cell count, unspecified: Secondary | ICD-10-CM

## 2015-05-22 DIAGNOSIS — D696 Thrombocytopenia, unspecified: Secondary | ICD-10-CM

## 2015-05-22 LAB — COMPREHENSIVE METABOLIC PANEL (CC13)
ALK PHOS: 86 U/L (ref 40–150)
ALT: 12 U/L (ref 0–55)
AST: 16 U/L (ref 5–34)
Albumin: 3.9 g/dL (ref 3.5–5.0)
Anion Gap: 8 mEq/L (ref 3–11)
BILIRUBIN TOTAL: 0.51 mg/dL (ref 0.20–1.20)
BUN: 12.2 mg/dL (ref 7.0–26.0)
CO2: 26 mEq/L (ref 22–29)
Calcium: 9.4 mg/dL (ref 8.4–10.4)
Chloride: 108 mEq/L (ref 98–109)
Creatinine: 0.7 mg/dL (ref 0.6–1.1)
EGFR: >90 mL/min/{1.73_m2} (ref >90)
GLUCOSE: 99 mg/dL (ref 70–140)
POTASSIUM: 3.9 meq/L (ref 3.5–5.1)
Sodium: 142 mEq/L (ref 136–145)
Total Protein: 6.5 g/dL (ref 6.4–8.3)

## 2015-05-22 LAB — CBC WITH DIFFERENTIAL/PLATELET
BASO%: 1.1 % (ref 0.0–2.0)
Basophils Absolute: 0 10*3/uL (ref 0.0–0.1)
EOS%: 3.7 % (ref 0.0–7.0)
Eosinophils Absolute: 0.1 10*3/uL (ref 0.0–0.5)
HEMATOCRIT: 31.8 % — AB (ref 34.8–46.6)
HGB: 10.4 g/dL — ABNORMAL LOW (ref 11.6–15.9)
LYMPH#: 1.3 10*3/uL (ref 0.9–3.3)
LYMPH%: 33.6 % (ref 14.0–49.7)
MCH: 21.4 pg — AB (ref 25.1–34.0)
MCHC: 32.7 g/dL (ref 31.5–36.0)
MCV: 65.3 fL — ABNORMAL LOW (ref 79.5–101.0)
MONO#: 0.2 10*3/uL (ref 0.1–0.9)
MONO%: 5.1 % (ref 0.0–14.0)
NEUT#: 2.1 10*3/uL (ref 1.5–6.5)
NEUT%: 56.5 % (ref 38.4–76.8)
Platelets: 136 10*3/uL — ABNORMAL LOW (ref 145–400)
RBC: 4.87 10*6/uL (ref 3.70–5.45)
RDW: 14.5 % (ref 11.2–14.5)
WBC: 3.8 10*3/uL — AB (ref 3.9–10.3)
nRBC: 0 % (ref 0–0)

## 2015-05-22 LAB — TECHNOLOGIST REVIEW

## 2015-05-22 MED ORDER — DIPHENHYDRAMINE HCL 25 MG PO CAPS
50.0000 mg | ORAL_CAPSULE | Freq: Once | ORAL | Status: AC
  Administered 2015-05-22: 50 mg via ORAL

## 2015-05-22 MED ORDER — SODIUM CHLORIDE 0.9 % IV SOLN
Freq: Once | INTRAVENOUS | Status: AC
  Administered 2015-05-22: 13:00:00 via INTRAVENOUS

## 2015-05-22 MED ORDER — TRASTUZUMAB CHEMO INJECTION 440 MG
6.0000 mg/kg | Freq: Once | INTRAVENOUS | Status: AC
  Administered 2015-05-22: 294 mg via INTRAVENOUS
  Filled 2015-05-22: qty 14

## 2015-05-22 MED ORDER — ACETAMINOPHEN 325 MG PO TABS
650.0000 mg | ORAL_TABLET | Freq: Once | ORAL | Status: AC
  Administered 2015-05-22: 650 mg via ORAL

## 2015-05-22 MED ORDER — DIPHENHYDRAMINE HCL 25 MG PO CAPS
ORAL_CAPSULE | ORAL | Status: AC
  Filled 2015-05-22: qty 2

## 2015-05-22 MED ORDER — SODIUM CHLORIDE 0.9 % IJ SOLN
10.0000 mL | INTRAMUSCULAR | Status: DC | PRN
  Administered 2015-05-22: 10 mL
  Filled 2015-05-22: qty 10

## 2015-05-22 MED ORDER — ACETAMINOPHEN 325 MG PO TABS
ORAL_TABLET | ORAL | Status: AC
  Filled 2015-05-22: qty 2

## 2015-05-22 MED ORDER — HEPARIN SOD (PORK) LOCK FLUSH 100 UNIT/ML IV SOLN
500.0000 [IU] | Freq: Once | INTRAVENOUS | Status: AC | PRN
  Administered 2015-05-22: 500 [IU]
  Filled 2015-05-22: qty 5

## 2015-05-22 NOTE — Patient Instructions (Signed)
[facility] Discharge Instructions for Patients Receiving Chemotherapy  Today you received the following chemotherapy agents:  Herceptin  To help prevent nausea and vomiting after your treatment, we encourage you to take your nausea medication as prescribed.   If you develop nausea and vomiting that is not controlled by your nausea medication, call the clinic.   BELOW ARE SYMPTOMS THAT SHOULD BE REPORTED IMMEDIATELY:  *FEVER GREATER THAN 100.5 F  *CHILLS WITH OR WITHOUT FEVER  NAUSEA AND VOMITING THAT IS NOT CONTROLLED WITH YOUR NAUSEA MEDICATION  *UNUSUAL SHORTNESS OF BREATH  *UNUSUAL BRUISING OR BLEEDING  TENDERNESS IN MOUTH AND THROAT WITH OR WITHOUT PRESENCE OF ULCERS  *URINARY PROBLEMS  *BOWEL PROBLEMS  UNUSUAL RASH Items with * indicate a potential emergency and should be followed up as soon as possible.  Feel free to call the clinic you have any questions or concerns. The clinic phone number is [phone].  Please show the CHEMO ALERT CARD at check-in to the Emergency Department and triage nurse.   

## 2015-05-22 NOTE — Assessment & Plan Note (Signed)
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 Positive for lymphovascular invasion, extracapsular tumor extension grade 3 ER 53%, PR 30%, HER-2 positive ratio 5.95, Ki-67 33%, T2, N3, M0 stage IIIc with high-grade DCIS status post left mastectomy 09/06/2014; completed adjuvant chemotherapy 01/20/2015 and currently on Herceptin maintenance and now getting radiation  Plan:  1. Continue with Herceptin maintenance every 3 weeks, follow-up in 6 weeks 2. Started tamoxifen 20 mg daily 05/01/2015 patient understands that she should not get pregnant on tamoxifen.  Tamoxifen toxicities:  Anemia and leukopenia and thrombocytopenia: We will recheck CBC with a next follow-up visit Monitoring closely with echocardiograms every 3 months.  Return to clinic in 6 weeks for follow-up.

## 2015-05-22 NOTE — Progress Notes (Signed)
Patient Care Team: No Pcp Per Patient as PCP - General (General Practice)  DIAGNOSIS: Breast cancer of lower-outer quadrant of left female breast   Staging form: Breast, AJCC 7th Edition     Clinical: No stage assigned - Unsigned     Pathologic: Stage IIIC (T2, N3a, cM0) - Signed by Rulon Eisenmenger, MD on 09/11/2014   SUMMARY OF ONCOLOGIC HISTORY:   Breast cancer of lower-outer quadrant of left female breast   06/06/2014 Imaging Palpable Left breast mass at 12:00 position 1.9 cm, initial biopsy 06/12/2014 revealed fibrocystic changes.   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 The genes tested were ATM, BARD1, BRCA1, BRCA2, BRIP1, CDH1, CHEK2, MRE11A, MUTYH, NBN, NF1, PALB2, PTEN, RAD50, RAD51C, RAD51D, and TP53.   10/04/2014 - 01/20/2015 Chemotherapy Patient was started on adjuvant chemotherapy with Taxotere, Carboplatin, Herceptin, Perjeta.     02/11/2015 - 03/25/2015 Radiation Therapy Adjuvant radiation therapy   05/01/2015 -  Anti-estrogen oral therapy tamoxifen 20 mg daily    CHIEF COMPLIANT: follow-up on tamoxifen and Herceptin maintenance  INTERVAL HISTORY: [name] is a 29 year old with above-mentioned history of left breast cancer treated with mastectomy followed by adjuvant chemotherapy and radiation currently on tamoxifen and Herceptin maintenance. She is tolerating tamoxifen extremely well without any hot flashes or myalgias. She reports mild nausea and tamoxifen. No  problems with Herceptin. Her last echocardiogram done in June was normal. She complains of pinkish discharge after sex.  REVIEW OF SYSTEMS:   Constitutional: Denies fevers, chills or abnormal weight loss Eyes: Denies blurriness of vision Ears, nose, mouth, throat, and face: Denies mucositis or sore throat Respiratory: Denies cough, dyspnea or wheezes Cardiovascular: Denies palpitation, chest discomfort or lower extremity swelling Gastrointestinal:  Denies nausea, heartburn or change in bowel habits Skin: Denies abnormal skin rashes Lymphatics: Denies new lymphadenopathy or easy bruising Neurological:Denies numbness, tingling or new weaknesses Behavioral/Psych: Mood is stable, no new changes  Breast: breast is healing well from the effects of prior radiation. All other systems were reviewed with the patient and are negative.  I have reviewed the past medical history, past surgical history, social history and family history with the patient and they are unchanged from previous note.  ALLERGIES:  is allergic to zofran.  MEDICATIONS:  Current Outpatient Prescriptions  Medication Sig Dispense Refill  . acetaminophen (TYLENOL) 500 MG tablet Take 500 mg by mouth every 6 (six) hours as needed.    . gabapentin (NEURONTIN) 300 MG capsule Take 300 mg by mouth 3 (three) times daily.    Marland Kitchen lidocaine-prilocaine (EMLA) cream Apply to affected area once 30 g 3  . Multiple Vitamin (MULTIVITAMIN WITH MINERALS) TABS tablet Take 1 tablet by mouth daily.    . tamoxifen (NOLVADEX) 20 MG tablet Take 1 tablet (20 mg total) by mouth daily. 30 tablet 1   No current facility-administered medications for this visit.   Facility-Administered Medications Ordered in Other Visits  Medication Dose Route Frequency Provider Last Rate Last Dose  . sodium chloride 0.9 % injection 10 mL  10 mL Intravenous PRN Nicholas Lose,  MD   10 mL at 12/09/14 1632    PHYSICAL EXAMINATION: ECOG PERFORMANCE STATUS: 1 - Symptomatic but  completely ambulatory  Filed Vitals:   05/22/15 1148  BP: 85/53  Pulse: 74  Temp: 98.4 F (36.9 C)  Resp: 18   Filed Weights   05/22/15 1148  Weight: 105 lb 11.2 oz (47.945 kg)    GENERAL:alert, no distress and comfortable SKIN: skin color, texture, turgor are normal, no rashes or significant lesions EYES: normal, Conjunctiva are pink and non-injected, sclera clear OROPHARYNX:no exudate, no erythema and lips, buccal mucosa, and tongue normal  NECK: supple, thyroid normal size, non-tender, without nodularity LYMPH:  no palpable lymphadenopathy in the cervical, axillary or inguinal LUNGS: clear to auscultation and percussion with normal breathing effort HEART: regular rate & rhythm and no murmurs and no lower extremity edema ABDOMEN:abdomen soft, non-tender and normal bowel sounds Musculoskeletal:no cyanosis of digits and no clubbing  NEURO: alert & oriented x 3 with fluent speech, no focal motor/sensory deficits  LABORATORY DATA:  I have reviewed the data as listed   Chemistry      Component Value Date/Time   NA 144 03/24/2015 0906   NA 140 09/04/2014 1532   K 4.0 03/24/2015 0906   K 3.7 09/04/2014 1532   CL 105 09/04/2014 1532   CO2 26 03/24/2015 0906   CO2 25 09/04/2014 1532   BUN 7.8 03/24/2015 0906   BUN 7 09/04/2014 1532   CREATININE 0.7 03/24/2015 0906   CREATININE 0.58 09/04/2014 1532      Component Value Date/Time   CALCIUM 9.6 03/24/2015 0906   CALCIUM 9.5 09/04/2014 1532   ALKPHOS 86 03/24/2015 0906   AST 15 03/24/2015 0906   ALT 14 03/24/2015 0906   BILITOT 0.66 03/24/2015 0906       Lab Results  Component Value Date   WBC 3.8* 05/22/2015   HGB 10.4* 05/22/2015   HCT 31.8* 05/22/2015   MCV 65.3* 05/22/2015   PLT 136* 05/22/2015   NEUTROABS 2.1 05/22/2015    ASSESSMENT & PLAN:  Breast cancer of lower-outer quadrant of left female breast Left breast invasive ductal carcinoma multifocal disease with multiple nodules ranging from 0.3 cm up to  3.2 cm and 17/20 lymph nodes positive. Positive for lymphovascular invasion, extracapsular tumor extension grade 3 ER 53%, PR 30%, HER-2 positive ratio 5.95, Ki-67 33%, T2, N3, M0 stage IIIc with high-grade DCIS status post left mastectomy 09/06/2014; completed adjuvant chemotherapy 01/20/2015 and currently on Herceptin maintenance and now getting radiation  Plan:  1. Continue with Herceptin maintenance every 3 weeks, follow-up in 6 weeks Herceptin to complete October 2016 2. Started tamoxifen 20 mg daily 05/01/2015 patient understands that she should not get pregnant on tamoxifen. Plan is to treat her with tamoxifen for 10 years  Tamoxifen toxicities: Mild nausea: I instructed her to take tamoxifen after eating food Mild pinkish discharge after sex: as long as there is no bleeding, or pain, it can be monitored. I discussed with her that potentially her periods could still come back.  Anemia and leukopenia and thrombocytopenia: white count and platelets are improving hemoglobin is still stable Monitoring closely with echocardiograms every 3 months.  Return to clinic in 9 weeks for follow-up.  No orders of the defined types were placed in this encounter.   The patient has a good understanding of the overall plan. she agrees with it. she will call with any problems that may develop before the next visit here.   Nicholas Lose  Raliegh Ip, MD

## 2015-05-22 NOTE — Telephone Encounter (Signed)
Appointments made and avs printed for patient °

## 2015-06-06 ENCOUNTER — Encounter: Payer: Self-pay | Admitting: Hematology and Oncology

## 2015-06-06 NOTE — Progress Notes (Signed)
Patient is uninsured and getting discount for neulasta already.

## 2015-06-12 ENCOUNTER — Other Ambulatory Visit: Payer: Self-pay

## 2015-06-12 ENCOUNTER — Other Ambulatory Visit (HOSPITAL_BASED_OUTPATIENT_CLINIC_OR_DEPARTMENT_OTHER): Payer: Self-pay

## 2015-06-12 ENCOUNTER — Ambulatory Visit (HOSPITAL_BASED_OUTPATIENT_CLINIC_OR_DEPARTMENT_OTHER): Payer: Self-pay

## 2015-06-12 VITALS — BP 86/58 | HR 83 | Temp 97.7°F | Resp 17

## 2015-06-12 DIAGNOSIS — C50512 Malignant neoplasm of lower-outer quadrant of left female breast: Secondary | ICD-10-CM

## 2015-06-12 DIAGNOSIS — C773 Secondary and unspecified malignant neoplasm of axilla and upper limb lymph nodes: Secondary | ICD-10-CM

## 2015-06-12 DIAGNOSIS — Z5112 Encounter for antineoplastic immunotherapy: Secondary | ICD-10-CM

## 2015-06-12 LAB — CBC WITH DIFFERENTIAL/PLATELET
BASO%: 1.2 % (ref 0.0–2.0)
Basophils Absolute: 0.1 10*3/uL (ref 0.0–0.1)
EOS ABS: 0.2 10*3/uL (ref 0.0–0.5)
EOS%: 3.9 % (ref 0.0–7.0)
HCT: 35.6 % (ref 34.8–46.6)
HGB: 11.2 g/dL — ABNORMAL LOW (ref 11.6–15.9)
LYMPH%: 29.5 % (ref 14.0–49.7)
MCH: 20.9 pg — AB (ref 25.1–34.0)
MCHC: 31.5 g/dL (ref 31.5–36.0)
MCV: 66.1 fL — ABNORMAL LOW (ref 79.5–101.0)
MONO#: 0.3 10*3/uL (ref 0.1–0.9)
MONO%: 5.2 % (ref 0.0–14.0)
NEUT%: 60.2 % (ref 38.4–76.8)
NEUTROS ABS: 3 10*3/uL (ref 1.5–6.5)
Platelets: 199 10*3/uL (ref 145–400)
RBC: 5.38 10*6/uL (ref 3.70–5.45)
RDW: 16 % — AB (ref 11.2–14.5)
WBC: 5 10*3/uL (ref 3.9–10.3)
lymph#: 1.5 10*3/uL (ref 0.9–3.3)

## 2015-06-12 LAB — COMPREHENSIVE METABOLIC PANEL (CC13)
ALK PHOS: 82 U/L (ref 40–150)
ALT: 6 U/L (ref 0–55)
ANION GAP: 6 meq/L (ref 3–11)
AST: 13 U/L (ref 5–34)
Albumin: 4 g/dL (ref 3.5–5.0)
BUN: 9 mg/dL (ref 7.0–26.0)
CALCIUM: 9.2 mg/dL (ref 8.4–10.4)
CO2: 29 meq/L (ref 22–29)
Chloride: 108 mEq/L (ref 98–109)
Creatinine: 0.7 mg/dL (ref 0.6–1.1)
GLUCOSE: 118 mg/dL (ref 70–140)
POTASSIUM: 4.5 meq/L (ref 3.5–5.1)
SODIUM: 143 meq/L (ref 136–145)
Total Bilirubin: 0.58 mg/dL (ref 0.20–1.20)
Total Protein: 6.7 g/dL (ref 6.4–8.3)

## 2015-06-12 MED ORDER — DIPHENHYDRAMINE HCL 25 MG PO CAPS
50.0000 mg | ORAL_CAPSULE | Freq: Once | ORAL | Status: AC
  Administered 2015-06-12: 50 mg via ORAL

## 2015-06-12 MED ORDER — ACETAMINOPHEN 325 MG PO TABS
ORAL_TABLET | ORAL | Status: AC
Start: 2015-06-12 — End: 2015-06-12
  Filled 2015-06-12: qty 2

## 2015-06-12 MED ORDER — MAGIC MOUTHWASH: 5.0000 mL | Freq: Four times a day (QID) | ORAL | Status: DC | PRN

## 2015-06-12 MED ORDER — SODIUM CHLORIDE 0.9 % IJ SOLN
10.0000 mL | INTRAMUSCULAR | Status: DC | PRN
  Administered 2015-06-12: 10 mL
  Filled 2015-06-12: qty 10

## 2015-06-12 MED ORDER — ACETAMINOPHEN 325 MG PO TABS
650.0000 mg | ORAL_TABLET | Freq: Once | ORAL | Status: AC
  Administered 2015-06-12: 650 mg via ORAL

## 2015-06-12 MED ORDER — TRASTUZUMAB CHEMO INJECTION 440 MG
6.0000 mg/kg | Freq: Once | INTRAVENOUS | Status: AC
  Administered 2015-06-12: 294 mg via INTRAVENOUS
  Filled 2015-06-12: qty 14

## 2015-06-12 MED ORDER — DIPHENHYDRAMINE HCL 25 MG PO CAPS
ORAL_CAPSULE | ORAL | Status: AC
  Filled 2015-06-12: qty 2

## 2015-06-12 MED ORDER — SODIUM CHLORIDE 0.9 % IV SOLN
Freq: Once | INTRAVENOUS | Status: AC
  Administered 2015-06-12: 12:00:00 via INTRAVENOUS

## 2015-06-12 MED ORDER — HEPARIN SOD (PORK) LOCK FLUSH 100 UNIT/ML IV SOLN
500.0000 [IU] | Freq: Once | INTRAVENOUS | Status: AC | PRN
  Administered 2015-06-12: 500 [IU]
  Filled 2015-06-12: qty 5

## 2015-06-12 NOTE — Progress Notes (Signed)
Upon assessment, pt complaining of mouth sores.  Some small sores noted on very back of tongue.  Pt requesting MMW.  MD notified and Terri, RN will provide pt with paper prescription prior to pt d/c.

## 2015-06-12 NOTE — Patient Instructions (Signed)
[facility] Discharge Instructions for Patients Receiving Chemotherapy  Today you received the following chemotherapy agents: Herceptin   To help prevent nausea and vomiting after your treatment, we encourage you to take your nausea medication as directed.    If you develop nausea and vomiting that is not controlled by your nausea medication, call the clinic.   BELOW ARE SYMPTOMS THAT SHOULD BE REPORTED IMMEDIATELY:  *FEVER GREATER THAN 100.5 F  *CHILLS WITH OR WITHOUT FEVER  NAUSEA AND VOMITING THAT IS NOT CONTROLLED WITH YOUR NAUSEA MEDICATION  *UNUSUAL SHORTNESS OF BREATH  *UNUSUAL BRUISING OR BLEEDING  TENDERNESS IN MOUTH AND THROAT WITH OR WITHOUT PRESENCE OF ULCERS  *URINARY PROBLEMS  *BOWEL PROBLEMS  UNUSUAL RASH Items with * indicate a potential emergency and should be followed up as soon as possible.  Feel free to call the clinic you have any questions or concerns. The clinic phone number is [phone].  Please show the CHEMO ALERT CARD at check-in to the Emergency Department and triage nurse.   

## 2015-07-03 ENCOUNTER — Other Ambulatory Visit (HOSPITAL_BASED_OUTPATIENT_CLINIC_OR_DEPARTMENT_OTHER): Payer: Self-pay

## 2015-07-03 ENCOUNTER — Ambulatory Visit (HOSPITAL_BASED_OUTPATIENT_CLINIC_OR_DEPARTMENT_OTHER): Payer: Self-pay | Admitting: Nurse Practitioner

## 2015-07-03 ENCOUNTER — Telehealth: Payer: Self-pay | Admitting: Nurse Practitioner

## 2015-07-03 ENCOUNTER — Ambulatory Visit (HOSPITAL_BASED_OUTPATIENT_CLINIC_OR_DEPARTMENT_OTHER): Payer: Self-pay

## 2015-07-03 ENCOUNTER — Ambulatory Visit (HOSPITAL_COMMUNITY)
Admission: RE | Admit: 2015-07-03 | Discharge: 2015-07-03 | Disposition: A | Discharge status: Home / Self Care | Payer: Self-pay | Source: Ambulatory Visit | Attending: Nurse Practitioner | Admitting: Nurse Practitioner

## 2015-07-03 VITALS — BP 91/63 | HR 86 | Temp 98.0°F | Resp 16

## 2015-07-03 DIAGNOSIS — R6889 Other general symptoms and signs: Secondary | ICD-10-CM

## 2015-07-03 DIAGNOSIS — C50512 Malignant neoplasm of lower-outer quadrant of left female breast: Secondary | ICD-10-CM

## 2015-07-03 DIAGNOSIS — C773 Secondary and unspecified malignant neoplasm of axilla and upper limb lymph nodes: Secondary | ICD-10-CM

## 2015-07-03 DIAGNOSIS — Z5112 Encounter for antineoplastic immunotherapy: Secondary | ICD-10-CM

## 2015-07-03 DIAGNOSIS — R609 Edema, unspecified: Secondary | ICD-10-CM | POA: Insufficient documentation

## 2015-07-03 DIAGNOSIS — I890 Lymphedema, not elsewhere classified: Secondary | ICD-10-CM

## 2015-07-03 DIAGNOSIS — I959 Hypotension, unspecified: Secondary | ICD-10-CM

## 2015-07-03 DIAGNOSIS — M79602 Pain in left arm: Secondary | ICD-10-CM

## 2015-07-03 DIAGNOSIS — T678XXA Other effects of heat and light, initial encounter: Secondary | ICD-10-CM

## 2015-07-03 LAB — COMPREHENSIVE METABOLIC PANEL (CC13)
ALT: 9 U/L (ref 0–55)
ANION GAP: 10 meq/L (ref 3–11)
AST: 14 U/L (ref 5–34)
Albumin: 3.9 g/dL (ref 3.5–5.0)
Alkaline Phosphatase: 76 U/L (ref 40–150)
BUN: 9.8 mg/dL (ref 7.0–26.0)
CHLORIDE: 109 meq/L (ref 98–109)
CO2: 24 meq/L (ref 22–29)
CREATININE: 0.7 mg/dL (ref 0.6–1.1)
Calcium: 9.3 mg/dL (ref 8.4–10.4)
EGFR: >90 mL/min/{1.73_m2} (ref >90)
Glucose: 109 mg/dl (ref 70–140)
Potassium: 4.1 mEq/L (ref 3.5–5.1)
Sodium: 143 mEq/L (ref 136–145)
Total Bilirubin: 0.54 mg/dL (ref 0.20–1.20)
Total Protein: 6.4 g/dL (ref 6.4–8.3)

## 2015-07-03 LAB — CBC WITH DIFFERENTIAL/PLATELET
BASO%: 1.3 % (ref 0.0–2.0)
Basophils Absolute: 0.1 10*3/uL (ref 0.0–0.1)
EOS%: 4.5 % (ref 0.0–7.0)
Eosinophils Absolute: 0.2 10*3/uL (ref 0.0–0.5)
HCT: 34.2 % — ABNORMAL LOW (ref 34.8–46.6)
HGB: 10.9 g/dL — ABNORMAL LOW (ref 11.6–15.9)
LYMPH%: 31.7 % (ref 14.0–49.7)
MCH: 21 pg — ABNORMAL LOW (ref 25.1–34.0)
MCHC: 31.8 g/dL (ref 31.5–36.0)
MCV: 66 fL — ABNORMAL LOW (ref 79.5–101.0)
MONO#: 0.3 10*3/uL (ref 0.1–0.9)
MONO%: 6.1 % (ref 0.0–14.0)
NEUT#: 2.3 10*3/uL (ref 1.5–6.5)
NEUT%: 56.4 % (ref 38.4–76.8)
Platelets: 190 10*3/uL (ref 145–400)
RBC: 5.18 10*6/uL (ref 3.70–5.45)
RDW: 15.5 % — ABNORMAL HIGH (ref 11.2–14.5)
WBC: 4.1 10*3/uL (ref 3.9–10.3)
lymph#: 1.3 10*3/uL (ref 0.9–3.3)

## 2015-07-03 MED ORDER — ACETAMINOPHEN 325 MG PO TABS
ORAL_TABLET | ORAL | Status: AC
  Filled 2015-07-03: qty 2

## 2015-07-03 MED ORDER — DIPHENHYDRAMINE HCL 25 MG PO CAPS
50.0000 mg | ORAL_CAPSULE | Freq: Once | ORAL | Status: AC
  Administered 2015-07-03: 50 mg via ORAL

## 2015-07-03 MED ORDER — SODIUM CHLORIDE 0.9 % IJ SOLN
10.0000 mL | INTRAMUSCULAR | Status: DC | PRN
  Administered 2015-07-03: 10 mL
  Filled 2015-07-03: qty 10

## 2015-07-03 MED ORDER — DIPHENHYDRAMINE HCL 25 MG PO CAPS
ORAL_CAPSULE | ORAL | Status: AC
  Filled 2015-07-03: qty 2

## 2015-07-03 MED ORDER — TRASTUZUMAB CHEMO INJECTION 440 MG
6.0000 mg/kg | Freq: Once | INTRAVENOUS | Status: AC
  Administered 2015-07-03: 294 mg via INTRAVENOUS
  Filled 2015-07-03: qty 14

## 2015-07-03 MED ORDER — HEPARIN SOD (PORK) LOCK FLUSH 100 UNIT/ML IV SOLN
500.0000 [IU] | Freq: Once | INTRAVENOUS | Status: AC | PRN
  Administered 2015-07-03: 500 [IU]
  Filled 2015-07-03: qty 5

## 2015-07-03 MED ORDER — SODIUM CHLORIDE 0.9 % IV SOLN
Freq: Once | INTRAVENOUS | Status: AC
  Administered 2015-07-03: 11:00:00 via INTRAVENOUS

## 2015-07-03 MED ORDER — ACETAMINOPHEN 325 MG PO TABS
650.0000 mg | ORAL_TABLET | Freq: Once | ORAL | Status: AC
  Administered 2015-07-03: 650 mg via ORAL

## 2015-07-03 NOTE — Progress Notes (Signed)
*  PRELIMINARY RESULTS* Vascular Ultrasound Left upper extremity venous duplex has been completed.  Preliminary findings: negative for DVT and SVT.   Called results to [employer].   Landry Mellow, RDMS, RVT  07/03/2015, 3:52 PM

## 2015-07-03 NOTE — Patient Instructions (Signed)
Trastuzumab injection for infusion Qu es este medicamento? El TRASTUZUMAB es un anticuerpo monoclonal. Este medicamento acta sobre una protena llamada HER2. Esta protena se encuentra en algunos cnceres de estmago y de mama. Este medicamento puede detener el crecimiento de las clulas cancerosas. Este medicamento se puede usar con otros tratamientos para el cncer. Este medicamento puede ser utilizado para otros usos; si tiene alguna pregunta consulte con su proveedor de atencin mdica o con su farmacutico. MARCAS COMERCIALES DISPONIBLES: Herceptin Qu le debo informar a mi profesional de la salud antes de tomar este medicamento? Necesita saber si usted presenta alguno de los siguientes problemas o situaciones: -enfermedad cardiaca -insuficiencia cardiaca -infeccin (especialmente infecciones virales, como la varicela o el herpes) -enfermedad pulmonar o respiratoria, como asma -radioterapia reciente o continuada -una reaccin alrgica o inusual al trastuzumab, al alcohol benclico, a otros medicamentos, alimentos, colorantes o conservantes -si est embarazada o buscando quedar embarazada -si est amamantando a un beb Cmo debo utilizar este medicamento? Este medicamento se administra mediante infusin por va intravenosa. Lo administra un profesional de la salud calificado en un hospital o en un entorno clnico. Hable con su pediatra para informarse acerca del uso de este medicamento en nios. Este medicamento no est aprobado para uso en nios. Sobredosis: Pngase en contacto inmediatamente con un centro toxicolgico o una sala de urgencia si usted cree que haya tomado demasiado medicamento. ATENCIN: Este medicamento es solo para usted. No comparta este medicamento con nadie. Qu sucede si me olvido de una dosis? Es importante no olvidar ninguna dosis. Informe a su mdico o a su profesional de la salud si no puede asistir a una cita. Qu puede interactuar con este  medicamento? -ciclofosfamida -doxorrubicina -warfarina Puede ser que esta lista no menciona todas las posibles interacciones. Informe a su profesional de la salud de todos los productos a base de hierbas, medicamentos de venta libre o suplementos nutritivos que est tomando. Si usted fuma, consume bebidas alcohlicas o si utiliza drogas ilegales, indqueselo tambin a su profesional de la salud. Algunas sustancias pueden interactuar con su medicamento. A qu debo estar atento al usar este medicamento? Visite a su mdico para chequear su evolucin peridicamente. Si presenta alguno de los efectos secundarios, infrmelos. Sin embargo, contine con el tratamiento aun si se siente enfermo, a menos que su mdico le indique que lo suspenda. Consulte a su mdico o a su profesional de la salud por asesoramiento si tiene fiebre, escalofros, dolor de garganta o cualquier otro sntoma de resfro o gripe. No se trate usted mismo. Trate de no acercarse a personas que estn enfermas. Puede experimentar fiebre, escalofros y temblores durante la primera infusin. Estos efectos son generalmente leves y pueden tratarse con medicamentos. Informe a su profesional de la salud sobre cualquier reaccin que se presente durante la infusin. La fiebre y escalofros generalmente no aparecen con las infusiones posteriores. Qu efectos secundarios puedo tener al utilizar este medicamento? Efectos secundarios que debe informar a su mdico o a otro profesional de la salud tan pronto como sea posible: -problemas respiratorios -dolor en el pecho o palpitaciones -tos -mareos o desmayos -fiebre o escalofros, dolor de garganta -erupcin cutnea, picazn o urticarias -hinchazn de los tobillos o las piernas -cansancio o debilidad inusual Efectos secundarios que, por lo general, no requieren atencin mdica (debe informarlos a su mdico u otro profesional de la salud si persisten o si son molestos): -prdida del apetito -dolor  de cabeza -dolores musculares -nuseas Puede ser que esta lista no menciona todos los   posibles efectos secundarios. Comunquese a su mdico por asesoramiento mdico sobre los efectos secundarios. Usted puede informar los efectos secundarios a la FDA por telfono al 1-800-FDA-1088. Dnde debo guardar mi medicina? Este medicamento se administra en hospitales o clnicas y no necesitar guardarlo en su domicilio. ATENCIN: Este folleto es un resumen. Puede ser que no cubra toda la posible informacin. Si usted tiene preguntas acerca de esta medicina, consulte con su mdico, su farmacutico o su profesional de la salud.  2015, [employer]. (2009-09-15 17:17:26)   

## 2015-07-03 NOTE — Telephone Encounter (Signed)
MD visit added NP/CB in chemo room per 08/18 POF.... Cherylann Banas

## 2015-07-06 ENCOUNTER — Encounter: Payer: Self-pay | Admitting: Nurse Practitioner

## 2015-07-06 DIAGNOSIS — I959 Hypotension, unspecified: Secondary | ICD-10-CM | POA: Insufficient documentation

## 2015-07-06 DIAGNOSIS — I890 Lymphedema, not elsewhere classified: Secondary | ICD-10-CM | POA: Insufficient documentation

## 2015-07-06 DIAGNOSIS — M79603 Pain in arm, unspecified: Secondary | ICD-10-CM | POA: Insufficient documentation

## 2015-07-06 DIAGNOSIS — R6889 Other general symptoms and signs: Secondary | ICD-10-CM | POA: Insufficient documentation

## 2015-07-06 NOTE — Assessment & Plan Note (Signed)
Pt reports increased intolerance to any type of heat this past week.  She denies any rash development; but states that she begans itching when she is exposed to any heat.   Advised pt that this could quite possibly have nothing at all to do with the herceptin or tamoxifen- but just due to th esummer heat.  Advised pt to try keeping in the air conditioning if possible.

## 2015-07-06 NOTE — Assessment & Plan Note (Signed)
Mild lymphedema to left arm- which is chronic.  Pt is seeing Lymphedema clinic on regular basis.

## 2015-07-06 NOTE — Assessment & Plan Note (Addendum)
06/06/2014 Imaging Palpable Left breast mass at 12:00 position 1.9 cm, initial biopsy 06/12/2014 revealed fibrocystic changes.    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 The genes tested were ATM, BARD1, BRCA1, BRCA2, BRIP1, CDH1, CHEK2, MRE11A, MUTYH, NBN, NF1, PALB2, PTEN, RAD50, RAD51C, RAD51D, and TP53.   10/04/2014 - 01/20/2015 Chemotherapy Patient was started on adjuvant chemotherapy with Taxotere, Carboplatin, Herceptin, Perjeta.    02/11/2015 - 03/25/2015 Radiation Therapy Adjuvant radiation therapy   05/01/2015 -  Anti-estrogen oral therapy tamoxifen 20 mg daily       Blood counts remain stable; and pt will proceed with her herceptin infusion today. She will also continue to take the tamoxifen as directed.  Pt will return 07/24/15 for labs, visit, and next herceptin infusion.

## 2015-07-06 NOTE — Progress Notes (Signed)
SYMPTOM MANAGEMENT CLINIC   HPI: [name] 29 y.o. female diagnosed with breast cancer. S/P mastectomy, chemo, and radiation therapy.  Currently undergoing tamoxifen and herceptin therapy.   Pt presents to the cancer center to receive her next herceptin infusion.  She reports recent poor heat tolerance; stating that she begans itching over general body when she gets overheated. She denies any rash.   She is also c/o left arm pain; but denies any injury to arm.  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   She denies any fever or chills.   HPI  ROS  Past Medical History  Diagnosis Date  . Cancer     left breast  . Anemia   . Malignant neoplasm of breast (female), unspecified site 07/10/14    left, lower outer    Past Surgical History  Procedure Laterality Date  . Cesarean section    . Left breast biopsy    . Breast lumpectomy Left 07/10/2014    Procedure: LEFT BREAST LUMPECTOMY;  Surgeon: Merrie Roof, MD;  Location: [place];  Service: General;  Laterality: Left;  . Portacath placement Right 09/06/2014    Procedure: INSERTION PORT-A-CATH;  Surgeon: Autumn Messing III, MD;  Location: [place];  Service: General;  Laterality: Right;  . Mastectomy w/ sentinel node biopsy Left 09/06/2014    Procedure: LEFT MASTECTOMY WITH SENTINEL LYMPH NODE BIOPSY/NODE MAPPING;  Surgeon: Autumn Messing III, MD;  Location: [place];  Service: General;  Laterality: Left;    has Breast cancer of lower-outer quadrant of left female breast; Vaginal irritation; Conjunctivitis; Radiation dermatitis; Hypotension; Lymphedema; Arm pain; and Heat intolerance on her problem list.    is allergic to zofran.    Medication List       This list is accurate as of: 07/03/15 11:59 PM.  Always use your most recent med list.               acetaminophen 500 MG tablet  Commonly known as:  TYLENOL  Take 500 mg by mouth every 6 (six) hours as needed.     gabapentin 300  MG capsule  Commonly known as:  NEURONTIN  Take 300 mg by mouth 3 (three) times daily.     lidocaine-prilocaine cream  Commonly known as:  EMLA  Apply to affected area once     magic mouthwash Soln  Take 5 mLs by mouth 4 (four) times daily as needed for mouth pain. Swish, gargle and spit one to two teaspoonfuls every six hours as needed. May be swallowed if esophageal involvement. Shake well before using     multivitamin with minerals Tabs tablet  Take 1 tablet by mouth daily.     tamoxifen 20 MG tablet  Commonly known as:  NOLVADEX  Take 1 tablet (20 mg total) by mouth daily.         PHYSICAL EXAMINATION  Oncology Vitals 07/03/2015 07/03/2015 06/12/2015 05/22/2015 05/01/2015 04/25/2015 04/10/2015  Height - - - 152 cm - 152 cm 152 cm  Weight - - - 47.945 kg - 47.809 kg 47.265 kg  Weight (lbs) - - - 105 lbs 11 oz - 105 lbs 6 oz 104 lbs 3 oz  BMI (kg/m2) - - - 20.64 kg/m2 - 20.58 kg/m2 20.35 kg/m2  Temp - 98 97.7 98.4 98.2 98.5 97.5  Pulse 86 76 83 74 82 90 77  Resp - 16 17 18  - - 18  SpO2 - 100 100 100 - -  98  BSA (m2) - - - 1.42 m2 - 1.42 m2 1.42 m2   BP Readings from Last 3 Encounters:  07/03/15 91/63  06/12/15 86/58  05/22/15 85/53    Physical Exam  Constitutional: She is oriented to person, place, and time and well-developed, well-nourished, and in no distress.  HENT:  Head: Normocephalic and atraumatic.  Eyes: Conjunctivae and EOM are normal. Pupils are equal, round, and reactive to light. Right eye exhibits no discharge. Left eye exhibits no discharge. Scleral icterus is present.  Neck: Normal range of motion.  Pulmonary/Chest: Effort normal. No respiratory distress.  Musculoskeletal: Normal range of motion. She exhibits no edema or tenderness.  Neurological: She is alert and oriented to person, place, and time.  Skin: Skin is warm and dry. No rash noted. No erythema. No pallor.  Psychiatric: Affect normal.  Nursing note and vitals reviewed.   LABORATORY  DATA:. Appointment on 07/03/2015  Component Date Value Ref Range Status  . WBC 07/03/2015 4.1  3.9 - 10.3 10e3/uL Final  . NEUT# 07/03/2015 2.3  1.5 - 6.5 10e3/uL Final  . HGB 07/03/2015 10.9* 11.6 - 15.9 g/dL Final  . HCT 07/03/2015 34.2* 34.8 - 46.6 % Final  . Platelets 07/03/2015 190  145 - 400 10e3/uL Final  . MCV 07/03/2015 66.0* 79.5 - 101.0 fL Final  . MCH 07/03/2015 21.0* 25.1 - 34.0 pg Final  . MCHC 07/03/2015 31.8  31.5 - 36.0 g/dL Final  . RBC 07/03/2015 5.18  3.70 - 5.45 10e6/uL Final  . RDW 07/03/2015 15.5* 11.2 - 14.5 % Final  . lymph# 07/03/2015 1.3  0.9 - 3.3 10e3/uL Final  . MONO# 07/03/2015 0.3  0.1 - 0.9 10e3/uL Final  . Eosinophils Absolute 07/03/2015 0.2  0.0 - 0.5 10e3/uL Final  . Basophils Absolute 07/03/2015 0.1  0.0 - 0.1 10e3/uL Final  . NEUT% 07/03/2015 56.4  38.4 - 76.8 % Final  . LYMPH% 07/03/2015 31.7  14.0 - 49.7 % Final  . MONO% 07/03/2015 6.1  0.0 - 14.0 % Final  . EOS% 07/03/2015 4.5  0.0 - 7.0 % Final  . BASO% 07/03/2015 1.3  0.0 - 2.0 % Final  . Sodium 07/03/2015 143  136 - 145 mEq/L Final  . Potassium 07/03/2015 4.1  3.5 - 5.1 mEq/L Final  . Chloride 07/03/2015 109  98 - 109 mEq/L Final  . CO2 07/03/2015 24  22 - 29 mEq/L Final  . Glucose 07/03/2015 109  70 - 140 mg/dl Final  . BUN 07/03/2015 9.8  7.0 - 26.0 mg/dL Final  . Creatinine 07/03/2015 0.7  0.6 - 1.1 mg/dL Final  . Total Bilirubin 07/03/2015 0.54  0.20 - 1.20 mg/dL Final  . Alkaline Phosphatase 07/03/2015 76  40 - 150 U/L Final  . AST 07/03/2015 14  5 - 34 U/L Final  . ALT 07/03/2015 9  0 - 55 U/L Final  . Total Protein 07/03/2015 6.4  6.4 - 8.3 g/dL Final  . Albumin 07/03/2015 3.9  3.5 - 5.0 g/dL Final  . Calcium 07/03/2015 9.3  8.4 - 10.4 mg/dL Final  . Anion Gap 07/03/2015 10  3 - 11 mEq/L Final  . EGFR 07/03/2015 >90  >90 ml/min/1.73 m2 Final   eGFR is calculated using the CKD-EPI Creatinine Equation (2009)     RADIOGRAPHIC STUDIES: No results found.  ASSESSMENT/PLAN:     Arm pain C/o left arm pain for last 2-3 days. Denies any known injury or trauma. Denies any chest pain, chest pressure, SOB, or  pain with inspiration.   On exam- left arm tenderness with palp to left arm just above the elbow; but no obvious injury or trauma to site.  Pt with full ROM.   Doppler US obtained today was negative for DVT.  Advised pt to elevate arm and to take occ ibuprofen for pain.    Also advised pt to follow up with her PCP and/or ortho if discomfort continues.   Breast cancer of lower-outer quadrant of left female breast 06/06/2014 Imaging Palpable Left breast mass at 12:00 position 1.9 cm, initial biopsy 06/12/2014 revealed fibrocystic changes.    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 The genes tested were ATM, BARD1, BRCA1, BRCA2, BRIP1, CDH1, CHEK2, MRE11A, MUTYH, NBN, NF1, PALB2, PTEN, RAD50, RAD51C, RAD51D, and TP53.   10/04/2014 - 01/20/2015 Chemotherapy Patient was started on adjuvant chemotherapy with Taxotere, Carboplatin, Herceptin, Perjeta.    02/11/2015 - 03/25/2015 Radiation Therapy Adjuvant radiation therapy   05/01/2015 -  Anti-estrogen oral therapy tamoxifen 20 mg daily       Blood counts remain stable; and pt will proceed with her herceptin infusion today. She will also continue to take the tamoxifen as directed.  Pt will return 07/24/15 for labs, visit, and next herceptin infusion.   Heat intolerance Pt reports increased  intolerance to any type of heat this past week.  She denies any rash development; but states that she begans itching when she is exposed to any heat.   Advised pt that this could quite possibly have nothing at all to do with the herceptin or tamoxifen- but just due to th esummer heat.  Advised pt to try keeping in the air conditioning if possible.   Hypotension BP on initial check was 76/53.  Pt typically has a lower BP as baseline. Confirmed that pt does not take any BP meds. Most likely, pt is dehydrated.  Pt given additional IV fluid rehydration prior to the herceptin infusinon; with BP improving to 91/63. Pt encouraged to push fluids at home.   Lymphedema Mild lymphedema to left arm- which is chronic.  Pt is seeing Lymphedema clinic on regular basis.   Patient stated understanding of all instructions; and was in agreement with this plan of care. The patient knows to call the clinic with any problems, questions or concerns.   Review/collaboration with Dr. Lindi Adie regarding all aspects of patient's visit today.   Total time spent with patient was 25 minutes;  with greater than 65 percent of that time spent in face to face counseling regarding patient's symptoms,  and coordination of care and follow up.  Disclaimer: This note was dictated with voice recognition software. Similar sounding words can inadvertently be transcribed and may not be corrected upon review.   Drue Second, NP 07/06/2015

## 2015-07-06 NOTE — Assessment & Plan Note (Signed)
C/o left arm pain for last 2-3 days. Denies any known injury or trauma. Denies any chest pain, chest pressure, SOB, or pain with inspiration.   On exam- left arm tenderness with palp to left arm just above the elbow; but no obvious injury or trauma to site.  Pt with full ROM.   Doppler US obtained today was negative for DVT.  Advised pt to elevate arm and to take occ ibuprofen for pain.    Also advised pt to follow up with her PCP and/or ortho if discomfort continues.

## 2015-07-06 NOTE — Assessment & Plan Note (Signed)
BP on initial check was 76/53.  Pt typically has a lower BP as baseline. Confirmed that pt does not take any BP meds. Most likely, pt is dehydrated.  Pt given additional IV fluid rehydration prior to the herceptin infusinon; with BP improving to 91/63. Pt encouraged to push fluids at home.

## 2015-07-07 ENCOUNTER — Other Ambulatory Visit: Payer: Self-pay | Admitting: Hematology and Oncology

## 2015-07-07 ENCOUNTER — Other Ambulatory Visit: Payer: Self-pay | Admitting: Nurse Practitioner

## 2015-07-07 DIAGNOSIS — C50512 Malignant neoplasm of lower-outer quadrant of left female breast: Secondary | ICD-10-CM

## 2015-07-07 MED ORDER — TAMOXIFEN CITRATE 20 MG PO TABS: 20.0000 mg | ORAL_TABLET | Freq: Every day | ORAL | Status: DC

## 2015-07-07 NOTE — Progress Notes (Signed)
Pt walked in - told volunteer she needed a refill.  When I got to lobby patient had left. Volunteer stated pt left before she got back to the desk from letting me know the pt was here.  Per Almyra Free, she had spoken with Nira Conn who sent the Tamoxifen refill.  Almyra Free called pt and let her know which is why the pt left.  Almyra Free reports pt received her refill.

## 2015-07-15 ENCOUNTER — Ambulatory Visit: Payer: Self-pay | Attending: General Surgery | Admitting: Physical Therapy

## 2015-07-15 DIAGNOSIS — I972 Postmastectomy lymphedema syndrome: Secondary | ICD-10-CM | POA: Insufficient documentation

## 2015-07-15 DIAGNOSIS — M7582 Other shoulder lesions, left shoulder: Secondary | ICD-10-CM | POA: Insufficient documentation

## 2015-07-15 DIAGNOSIS — M25612 Stiffness of left shoulder, not elsewhere classified: Secondary | ICD-10-CM | POA: Insufficient documentation

## 2015-07-15 DIAGNOSIS — M25512 Pain in left shoulder: Secondary | ICD-10-CM | POA: Insufficient documentation

## 2015-07-15 NOTE — Therapy (Signed)
[place], [place], [address] Phone: [phone]   Fax:  [phone]  Physical Therapy Treatment  Patient Details  Name: [name] MRN: [id number] Date of Birth: 07/31/1986 Referring Provider:  Jovita Kussmaul, MD  Encounter Date: 07/15/2015      PT End of Session - 07/15/15 1719    Visit Number 2   Number of Visits 9   Date for PT Re-Evaluation 08/15/15   PT Start Time 8413   PT Stop Time 1430   PT Time Calculation (min) 45 min      Past Medical History  Diagnosis Date  . Cancer     left breast  . Anemia   . Malignant neoplasm of breast (female), unspecified site 07/10/14    left, lower outer    Past Surgical History  Procedure Laterality Date  . Cesarean section    . Left breast biopsy    . Breast lumpectomy Left 07/10/2014    Procedure: LEFT BREAST LUMPECTOMY;  Surgeon: Merrie Roof, MD;  Location: [place];  Service: General;  Laterality: Left;  . Portacath placement Right 09/06/2014    Procedure: INSERTION PORT-A-CATH;  Surgeon: Autumn Messing III, MD;  Location: [facility];  Service: General;  Laterality: Right;  . Mastectomy w/ sentinel node biopsy Left 09/06/2014    Procedure: LEFT MASTECTOMY WITH SENTINEL LYMPH NODE BIOPSY/NODE MAPPING;  Surgeon: Autumn Messing III, MD;  Location: [facility] [place];  Service: General;  Laterality: Left;    There were no vitals filed for this visit.  Visit Diagnosis:  Stiffness of joint, shoulder region, left  Pain in shoulder region, left  Decreased range of motion of left shoulder  Lymphedema syndrome, postmastectomy      Subjective Assessment - 07/15/15 1350    Subjective "bad"  pt states she still cannot raise her arm up withouth pain going down her own. She is going to the gym and using the treadmill and bike for 10 minutes and she uses her arms with that .,   Patient is accompained by: Interpreter   Pertinent History  Mastectomy 09/06/14 on left.  20 lymph nodes removed, 17 positive.  Chemotherapy and radiation after that; radiation completed Mar 25, 2015.   Patient Stated Goals to get arm to stop hurting   Currently in Pain? Yes   Pain Score 4   8 with elevation    Pain Location Elbow   Pain Orientation Left   Pain Descriptors / Indicators Tingling   Aggravating Factors  raising the arm                          [facility] Adult PT Treatment/Exercise - 07/15/15 0001    Self-Care   Self-Care Other Self-Care Comments   Other Self-Care Comments  provided chip back to be worn at left axilla to provide compression to tender areas    Neck Exercises: Seated   Other Seated Exercise lymphatic flow series with emphasis on active movement    Lumbar Exercises: Supine   Other Supine Lumbar Exercises lower trunk rotation  with stretch    Shoulder Exercises: Supine   Other Supine Exercises supine scapular series with yellow theraband. 5 reps each    Shoulder Exercises: Sidelying   External Rotation AROM  isometric with 2 # weight x 10 reps    Flexion AROM  to ceilingx 5 reps, then 5 small circles in each direction  Other Sidelying Exercises left scapular range of motion in all directions                 PT Education - 07/15/15 1718    Education provided Yes   Education Details weblink for lymphatic flow yoga series, supine scapular series. Pt ok with both of these even though they ar in [place]  as she will be able to follow the pictues   Person(s) Educated Patient   Methods Explanation;Demonstration;Handout   Comprehension Verbalized understanding;Returned demonstration                [place] - 07/15/15 1736    CC Long Term Goal  #1   Title Active left shoulder flexion at least 150 degrees for improved overhead reach.   Baseline 120 degrees   Time 4   Period Weeks   Status On-going   CC Long Term Goal  #2   Title Active left shoulder abduction at least  140 degrees for improved ADLs.   Baseline 87 degrees   Time 4   Period Weeks   Status On-going   CC Long Term Goal  #3   Title Independent with home exercise program.   Baseline No knowledge   Time 4   Period Weeks   Status On-going   CC Long Term Goal  #4   Title Report pain decrease to 2/10 or less.   Baseline 5/10 at rest to 10/10 with movement of left arm.   Time 4   Period Weeks   Status On-going   CC Long Term Goal  #5   Title left shoulder active external rotation to at least 80 degrees for improved ADLs   Time 4   Period Weeks   Status On-going            Plan - 07/15/15 1723    Clinical Impression Statement Patient returns today for follow up. She has not been here since July 6 so renewal will be sent today. She continues to have pain and decreased range of motion with pain to palpation at triggetr points at posterior axilla and along upper arm she has only been wearing her compression sleeve a few days a week because it is too hot.  Issued a chip pack today to try some compression to her axilla to see if that will offer some pain relief and allow her  more freedom of motion.    Pt will benefit from skilled therapeutic intervention in order to improve on the following deficits Decreased range of motion;Impaired UE functional use;Pain   Rehab Potential Excellent   Clinical Impairments Affecting Rehab Potential previous radiation,lymphedema and PT for painful shoulder movement    PT Treatment/Interventions Passive range of motion;Manual techniques;Therapeutic exercise;Patient/family education;ADLs/Self Care Home Management;Compression bandaging;Manual lymph drainage   PT Next Visit Plan   Review left shoulder ROM HEP. continue with neck and shoulder AROM, progress to closed chain scapular work    PT Home Exercise Plan continue HEP given previously for ROM and do stretches gently   Consulted and Agree with Plan of Care Patient        Problem List Patient Active  Problem List   Diagnosis Date Noted  . Hypotension 07/06/2015  . Lymphedema 07/06/2015  . Arm pain 07/06/2015  . Heat intolerance 07/06/2015  . 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 Conjunctivitis 03/13/2015  . Vaginal irritation 02/14/2015  . Breast cancer of lower-outer quadrant of left female breast 07/26/2014   Donato Heinz. Owens Shark, PT  07/15/2015, 5:40 PM  [place], [place], [address] Phone: [phone]   Fax:  [phone]

## 2015-07-15 NOTE — Patient Instructions (Addendum)
[email or web address] Lymphatic flow series with Shoosh Lettick Crotzer   Over Head Pull: Narrow Grip     [place] [id number]   On back, knees bent, feet flat, band across thighs, elbows straight but relaxed. Pull hands apart (start). Keeping elbows straight, bring arms up and over head, hands toward floor. Keep pull steady on band. Hold momentarily. Return slowly, keeping pull steady, back to start.  Do another set with arms wide on the band  Repeat  5-_10__ times. Band color __yellow____   Side Pull: Double Arm   On back, knees bent, feet flat. Arms perpendicular to body, shoulder level, elbows straight but relaxed. Pull arms out to sides, elbows straight. Resistance band comes across collarbones, hands toward floor. Hold momentarily. Slowly return to starting position. Repeat 5-__10_ times. Band color _yellow ____   Sash   On back, knees bent, feet flat, left hand on left hip, right hand above left. Pull right arm DIAGONALLY (hip to shoulder) across chest. Bring right arm along head toward floor. Hold momentarily. Slowly return to starting position. Repeat 5-10___ times. Do with left arm. Band color __yellow____   Shoulder Rotation: Double Arm   On back, knees bent, feet flat, elbows tucked at sides, bent 90, hands palms up. Pull hands apart and down toward floor, keeping elbows near sides. Hold momentarily. Slowly return to starting position. Repeat _5-10__ times. Band color ___yellow ___

## 2015-07-17 ENCOUNTER — Ambulatory Visit: Payer: Self-pay | Attending: General Surgery | Admitting: Physical Therapy

## 2015-07-17 DIAGNOSIS — M25612 Stiffness of left shoulder, not elsewhere classified: Secondary | ICD-10-CM | POA: Insufficient documentation

## 2015-07-17 DIAGNOSIS — M7582 Other shoulder lesions, left shoulder: Secondary | ICD-10-CM | POA: Insufficient documentation

## 2015-07-17 DIAGNOSIS — C50912 Malignant neoplasm of unspecified site of left female breast: Secondary | ICD-10-CM | POA: Insufficient documentation

## 2015-07-17 DIAGNOSIS — I972 Postmastectomy lymphedema syndrome: Secondary | ICD-10-CM | POA: Insufficient documentation

## 2015-07-17 DIAGNOSIS — Z9012 Acquired absence of left breast and nipple: Secondary | ICD-10-CM | POA: Insufficient documentation

## 2015-07-17 DIAGNOSIS — M25512 Pain in left shoulder: Secondary | ICD-10-CM | POA: Insufficient documentation

## 2015-07-22 ENCOUNTER — Ambulatory Visit: Payer: Self-pay

## 2015-07-22 DIAGNOSIS — M25512 Pain in left shoulder: Secondary | ICD-10-CM

## 2015-07-22 DIAGNOSIS — IMO0001 Reserved for inherently not codable concepts without codable children: Secondary | ICD-10-CM

## 2015-07-22 DIAGNOSIS — Z9012 Acquired absence of left breast and nipple: Secondary | ICD-10-CM

## 2015-07-22 DIAGNOSIS — M25612 Stiffness of left shoulder, not elsewhere classified: Secondary | ICD-10-CM

## 2015-07-22 DIAGNOSIS — I972 Postmastectomy lymphedema syndrome: Secondary | ICD-10-CM
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
## 2015-07-24 ENCOUNTER — Other Ambulatory Visit (HOSPITAL_BASED_OUTPATIENT_CLINIC_OR_DEPARTMENT_OTHER): Payer: Self-pay

## 2015-07-24 ENCOUNTER — Ambulatory Visit (HOSPITAL_BASED_OUTPATIENT_CLINIC_OR_DEPARTMENT_OTHER): Payer: Self-pay | Admitting: Hematology and Oncology

## 2015-07-24 ENCOUNTER — Telehealth: Payer: Self-pay | Admitting: Hematology and Oncology

## 2015-07-24 ENCOUNTER — Encounter: Payer: Self-pay | Admitting: *Deleted

## 2015-07-24 ENCOUNTER — Ambulatory Visit (HOSPITAL_BASED_OUTPATIENT_CLINIC_OR_DEPARTMENT_OTHER): Payer: Self-pay

## 2015-07-24 ENCOUNTER — Encounter: Payer: Self-pay | Admitting: Hematology and Oncology

## 2015-07-24 VITALS — BP 94/41 | HR 73 | Temp 98.6°F | Resp 17 | Ht 60.0 in | Wt 108.9 lb

## 2015-07-24 DIAGNOSIS — C50512 Malignant neoplasm of lower-outer quadrant of left female breast: Secondary | ICD-10-CM

## 2015-07-24 DIAGNOSIS — Z5112 Encounter for antineoplastic immunotherapy: Secondary | ICD-10-CM

## 2015-07-24 LAB — CBC WITH DIFFERENTIAL/PLATELET
BASO%: 1.2 % (ref 0.0–2.0)
Basophils Absolute: 0.1 10*3/uL (ref 0.0–0.1)
EOS ABS: 0.2 10*3/uL (ref 0.0–0.5)
EOS%: 3.5 % (ref 0.0–7.0)
HCT: 33.9 % — ABNORMAL LOW (ref 34.8–46.6)
HEMOGLOBIN: 10.7 g/dL — AB (ref 11.6–15.9)
LYMPH#: 1.6 10*3/uL (ref 0.9–3.3)
LYMPH%: 36.4 % (ref 14.0–49.7)
MCH: 20.9 pg — ABNORMAL LOW (ref 25.1–34.0)
MCHC: 31.5 g/dL (ref 31.5–36.0)
MCV: 66.2 fL — AB (ref 79.5–101.0)
MONO#: 0.3 10*3/uL (ref 0.1–0.9)
MONO%: 6 % (ref 0.0–14.0)
NEUT%: 52.9 % (ref 38.4–76.8)
NEUTROS ABS: 2.3 10*3/uL (ref 1.5–6.5)
PLATELETS: 181 10*3/uL (ref 145–400)
RBC: 5.12 10*6/uL (ref 3.70–5.45)
RDW: 15.3 % — AB (ref 11.2–14.5)
WBC: 4.3 10*3/uL (ref 3.9–10.3)

## 2015-07-24 LAB — COMPREHENSIVE METABOLIC PANEL (CC13)
ALBUMIN: 4 g/dL (ref 3.5–5.0)
ALK PHOS: 71 U/L (ref 40–150)
ALT: 10 U/L (ref 0–55)
AST: 13 U/L (ref 5–34)
Anion Gap: 8 mEq/L (ref 3–11)
BILIRUBIN TOTAL: 0.55 mg/dL (ref 0.20–1.20)
BUN: 8 mg/dL (ref 7.0–26.0)
CO2: 27 mEq/L (ref 22–29)
Calcium: 9.4 mg/dL (ref 8.4–10.4)
Chloride: 108 mEq/L (ref 98–109)
Creatinine: 0.7 mg/dL (ref 0.6–1.1)
EGFR: >90 mL/min/{1.73_m2} (ref >90)
GLUCOSE: 91 mg/dL (ref 70–140)
Potassium: 4.1 mEq/L (ref 3.5–5.1)
SODIUM: 142 meq/L (ref 136–145)
TOTAL PROTEIN: 6.7 g/dL (ref 6.4–8.3)

## 2015-07-24 MED ORDER — FERROUS SULFATE 325 (65 FE) MG PO TBEC: 325.0000 mg | DELAYED_RELEASE_TABLET | Freq: Three times a day (TID) | ORAL | Status: DC

## 2015-07-24 MED ORDER — ACETAMINOPHEN 325 MG PO TABS
650.0000 mg | ORAL_TABLET | Freq: Once | ORAL | Status: AC
  Administered 2015-07-24: 650 mg via ORAL

## 2015-07-24 MED ORDER — SODIUM CHLORIDE 0.9 % IV SOLN
Freq: Once | INTRAVENOUS | Status: AC
  Administered 2015-07-24: 12:00:00 via INTRAVENOUS

## 2015-07-24 MED ORDER — DIPHENHYDRAMINE HCL 25 MG PO CAPS
ORAL_CAPSULE | ORAL | Status: AC
  Filled 2015-07-24: qty 1

## 2015-07-24 MED ORDER — SODIUM CHLORIDE 0.9 % IJ SOLN
10.0000 mL | INTRAMUSCULAR | Status: DC | PRN
  Administered 2015-07-24: 10 mL
  Filled 2015-07-24: qty 10

## 2015-07-24 MED ORDER — HEPARIN SOD (PORK) LOCK FLUSH 100 UNIT/ML IV SOLN
500.0000 [IU] | Freq: Once | INTRAVENOUS | Status: AC | PRN
  Administered 2015-07-24: 500 [IU]
  Filled 2015-07-24: qty 5

## 2015-07-24 MED ORDER — SODIUM CHLORIDE 0.9 % IV SOLN
6.0000 mg/kg | Freq: Once | INTRAVENOUS | Status: AC
  Administered 2015-07-24: 294 mg via INTRAVENOUS
  Filled 2015-07-24: qty 14

## 2015-07-24 MED ORDER — DIPHENHYDRAMINE HCL 25 MG PO CAPS
50.0000 mg | ORAL_CAPSULE | Freq: Once | ORAL | Status: AC
  Administered 2015-07-24: 25 mg via ORAL

## 2015-07-24 MED ORDER — ACETAMINOPHEN 325 MG PO TABS
ORAL_TABLET | ORAL | Status: AC
  Filled 2015-07-24: qty 2

## 2015-07-24 NOTE — Assessment & Plan Note (Signed)
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 Positive for lymphovascular invasion, extracapsular tumor extension grade 3 ER 53%, PR 30%, HER-2 positive ratio 5.95, Ki-67 33%, T2, N3, M0 stage IIIc with high-grade DCIS status post left mastectomy 09/06/2014; completed adjuvant chemotherapy 01/20/2015 and currently on Herceptin maintenance and now getting radiation  Plan:  1. Continue with Herceptin maintenance every 3 weeks, follow-up in 6 weeks Herceptin to complete October 2016 2. Started tamoxifen 20 mg daily 05/01/2015 patient understands that she should not get pregnant on tamoxifen. Plan is to treat her with tamoxifen for 10 years  Tamoxifen toxicities: 1. Mild nausea: I instructed her to take tamoxifen after eating food 2. Mild pinkish discharge after sex: as long as there is no bleeding, or pain, it can be monitored. I discussed with her that potentially her periods could still come back. 3. Heat intolerance  Lymphedema: Received physical therapy Return to clinic in 6 weeks

## 2015-07-24 NOTE — Patient Instructions (Signed)
Trastuzumab injection for infusion Qu es este medicamento? El TRASTUZUMAB es un anticuerpo monoclonal. Este medicamento acta sobre una protena llamada HER2. Esta protena se encuentra en algunos cnceres de estmago y de mama. Este medicamento puede detener el crecimiento de las clulas cancerosas. Este medicamento se puede usar con otros tratamientos para el cncer. Este medicamento puede ser utilizado para otros usos; si tiene alguna pregunta consulte con su proveedor de atencin mdica o con su farmacutico. MARCAS COMERCIALES DISPONIBLES: Herceptin Qu le debo informar a mi profesional de la salud antes de tomar este medicamento? Necesita saber si usted presenta alguno de los siguientes problemas o situaciones: -enfermedad cardiaca -insuficiencia cardiaca -infeccin (especialmente infecciones virales, como la varicela o el herpes) -enfermedad pulmonar o respiratoria, como asma -radioterapia reciente o continuada -una reaccin alrgica o inusual al trastuzumab, al alcohol benclico, a otros medicamentos, alimentos, colorantes o conservantes -si est embarazada o buscando quedar embarazada -si est amamantando a un beb Cmo debo utilizar este medicamento? Este medicamento se administra mediante infusin por va intravenosa. Lo administra un profesional de la salud calificado en un hospital o en un entorno clnico. Hable con su pediatra para informarse acerca del uso de este medicamento en nios. Este medicamento no est aprobado para uso en nios. Sobredosis: Pngase en contacto inmediatamente con un centro toxicolgico o una sala de urgencia si usted cree que haya tomado demasiado medicamento. ATENCIN: Este medicamento es solo para usted. No comparta este medicamento con nadie. Qu sucede si me olvido de una dosis? Es importante no olvidar ninguna dosis. Informe a su mdico o a su profesional de la salud si no puede asistir a una cita. Qu puede interactuar con este  medicamento? -ciclofosfamida -doxorrubicina -warfarina Puede ser que esta lista no menciona todas las posibles interacciones. Informe a su profesional de la salud de todos los productos a base de hierbas, medicamentos de venta libre o suplementos nutritivos que est tomando. Si usted fuma, consume bebidas alcohlicas o si utiliza drogas ilegales, indqueselo tambin a su profesional de la salud. Algunas sustancias pueden interactuar con su medicamento. A qu debo estar atento al usar este medicamento? Visite a su mdico para chequear su evolucin peridicamente. Si presenta alguno de los efectos secundarios, infrmelos. Sin embargo, contine con el tratamiento aun si se siente enfermo, a menos que su mdico le indique que lo suspenda. Consulte a su mdico o a su profesional de la salud por asesoramiento si tiene fiebre, escalofros, dolor de garganta o cualquier otro sntoma de resfro o gripe. No se trate usted mismo. Trate de no acercarse a personas que estn enfermas. Puede experimentar fiebre, escalofros y temblores durante la primera infusin. Estos efectos son generalmente leves y pueden tratarse con medicamentos. Informe a su profesional de la salud sobre cualquier reaccin que se presente durante la infusin. La fiebre y escalofros generalmente no aparecen con las infusiones posteriores. Qu efectos secundarios puedo tener al utilizar este medicamento? Efectos secundarios que debe informar a su mdico o a otro profesional de la salud tan pronto como sea posible: -problemas respiratorios -dolor en el pecho o palpitaciones -tos -mareos o desmayos -fiebre o escalofros, dolor de garganta -erupcin cutnea, picazn o urticarias -hinchazn de los tobillos o las piernas -cansancio o debilidad inusual Efectos secundarios que, por lo general, no requieren atencin mdica (debe informarlos a su mdico u otro profesional de la salud si persisten o si son molestos): -prdida del apetito -dolor  de cabeza -dolores musculares -nuseas Puede ser que esta lista no menciona todos los   posibles efectos secundarios. Comunquese a su mdico por asesoramiento mdico sobre los efectos secundarios. Usted puede informar los efectos secundarios a la FDA por telfono al 1-800-FDA-1088. Dnde debo guardar mi medicina? Este medicamento se administra en hospitales o clnicas y no necesitar guardarlo en su domicilio. ATENCIN: Este folleto es un resumen. Puede ser que no cubra toda la posible informacin. Si usted tiene preguntas acerca de esta medicina, consulte con su mdico, su farmacutico o su profesional de la salud.  2015, [employer]. (2009-09-15 17:17:26)   

## 2015-07-24 NOTE — Progress Notes (Signed)
Patient Care Team: No Pcp Per Patient as PCP - General (General Practice)  DIAGNOSIS: Breast cancer of lower-outer quadrant of left female breast   Staging form: Breast, AJCC 7th Edition     Clinical: No stage assigned - Unsigned     Pathologic: Stage IIIC (T2, N3a, cM0) - Signed by Rulon Eisenmenger, MD on 09/11/2014   SUMMARY OF ONCOLOGIC HISTORY:   Breast cancer of lower-outer quadrant of left female breast   06/06/2014 Imaging Palpable Left breast mass at 12:00 position 1.9 cm, initial biopsy 06/12/2014 revealed fibrocystic changes.   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 The genes tested were ATM, BARD1, BRCA1, BRCA2, BRIP1, CDH1, CHEK2, MRE11A, MUTYH, NBN, NF1, PALB2, PTEN, RAD50, RAD51C, RAD51D, and TP53.   10/04/2014 - 01/20/2015 Chemotherapy Patient was started on adjuvant chemotherapy with Taxotere, Carboplatin, Herceptin, Perjeta.     02/11/2015 - 03/25/2015 Radiation Therapy Adjuvant radiation therapy   05/01/2015 -  Anti-estrogen oral therapy tamoxifen 20 mg daily    CHIEF COMPLIANT: follow-up on tamoxifen, maintenance Herceptin  INTERVAL HISTORY: [name] is a 29 year old lady with above-mentioned history of left breast cancer treated with mastectomy. She is here today to receive Herceptin maintenance. She was started on tamoxifen in June. She is tolerating it very well. She denies any hot flashes or myalgias. She does not have any excessive bleeding from her  uterus. She would like to know when she can get reconstruction of the left breast. Denies any pain or nodules breasts. Her hair is coming back.  REVIEW OF SYSTEMS:   Constitutional: Denies fevers, chills or abnormal weight loss Eyes: Denies blurriness of vision Ears, nose, mouth, throat, and face: Denies mucositis or sore throat Respiratory: Denies cough, dyspnea or wheezes Cardiovascular: Denies palpitation, chest discomfort or lower extremity swelling Gastrointestinal:  Denies nausea, heartburn or change in bowel habits Skin: Denies abnormal skin rashes Lymphatics: Denies new lymphadenopathy or easy bruising Neurological:Denies numbness, tingling or new weaknesses Behavioral/Psych: Mood is stable, no new changes  Breast:  denies any pain or lumps or nodules in either breasts All other systems were reviewed with the patient and are negative.  I have reviewed the past medical history, past surgical history, social history and family history with the patient and they are unchanged from previous note.  ALLERGIES:  is allergic to zofran.  MEDICATIONS:  Current Outpatient Prescriptions  Medication Sig Dispense Refill  . acetaminophen (TYLENOL) 500 MG tablet Take 500 mg by mouth every 6 (six) hours as needed.    . Alum & Mag Hydroxide-Simeth (MAGIC MOUTHWASH) SOLN Take 5 mLs by mouth 4 (four) times daily as needed for mouth pain. Swish, gargle and spit one to two teaspoonfuls every six hours as needed. May be swallowed if esophageal involvement. Shake well before using 120 mL 0  . gabapentin (NEURONTIN) 300 MG capsule Take 300 mg by mouth 3 (three) times daily.    Marland Kitchen lidocaine-prilocaine (EMLA) cream Apply to affected area once 30 g 3  . Multiple Vitamin (MULTIVITAMIN WITH MINERALS) TABS tablet Take 1 tablet by mouth daily.    Marland Kitchen  tamoxifen (NOLVADEX) 20 MG tablet TAKE 1 TABLET BY MOUTH ONCE DAILY 30 tablet 3  . tamoxifen (NOLVADEX) 20 MG tablet Take 1 tablet (20 mg total) by mouth daily. 90  tablet 1   No current facility-administered medications for this visit.   Facility-Administered Medications Ordered in Other Visits  Medication Dose Route Frequency Provider Last Rate Last Dose  . sodium chloride 0.9 % injection 10 mL  10 mL Intravenous PRN Nicholas Lose, MD   10 mL at 12/09/14 1632    PHYSICAL EXAMINATION: ECOG PERFORMANCE STATUS: 1 - Symptomatic but completely ambulatory  Filed Vitals:   07/24/15 1031  BP: 94/41  Pulse: 73  Temp: 98.6 F (37 C)  Resp: 17   Filed Weights   07/24/15 1031  Weight: 108 lb 14.4 oz (49.397 kg)    GENERAL:alert, no distress and comfortable SKIN: skin color, texture, turgor are normal, no rashes or significant lesions EYES: normal, Conjunctiva are pink and non-injected, sclera clear OROPHARYNX:no exudate, no erythema and lips, buccal mucosa, and tongue normal  NECK: supple, thyroid normal size, non-tender, without nodularity LYMPH:  no palpable lymphadenopathy in the cervical, axillary or inguinal LUNGS: clear to auscultation and percussion with normal breathing effort HEART: regular rate & rhythm and no murmurs and no lower extremity edema ABDOMEN:abdomen soft, non-tender and normal bowel sounds Musculoskeletal:no cyanosis of digits and no clubbing  NEURO: alert & oriented x 3 with fluent speech, no focal motor/sensory deficits LABORATORY DATA:  I have reviewed the data as listed   Chemistry      Component Value Date/Time   NA 143 07/03/2015 0924   NA 140 09/04/2014 1532   K 4.1 07/03/2015 0924   K 3.7 09/04/2014 1532   CL 105 09/04/2014 1532   CO2 24 07/03/2015 0924   CO2 25 09/04/2014 1532   BUN 9.8 07/03/2015 0924   BUN 7 09/04/2014 1532   CREATININE 0.7 07/03/2015 0924   CREATININE 0.58 09/04/2014 1532      Component Value Date/Time   CALCIUM 9.3 07/03/2015 0924   CALCIUM 9.5 09/04/2014 1532   ALKPHOS 76 07/03/2015 0924   AST 14 07/03/2015 0924   ALT 9 07/03/2015 0924   BILITOT 0.54 07/03/2015 0924        Lab Results  Component Value Date   WBC 4.3 07/24/2015   HGB 10.7* 07/24/2015   HCT 33.9* 07/24/2015   MCV 66.2* 07/24/2015   PLT 181 07/24/2015   NEUTROABS 2.3 07/24/2015   ASSESSMENT & PLAN:  Breast cancer of lower-outer quadrant of left female breast 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 Positive for lymphovascular invasion, extracapsular tumor extension grade 3 ER 53%, PR 30%, HER-2 positive ratio 5.95, Ki-67 33%, T2, N3, M0 stage IIIc with high-grade DCIS status post left mastectomy 09/06/2014; completed adjuvant chemotherapy 01/20/2015 and currently on Herceptin maintenance and now getting radiation  Plan:  1. Continue with Herceptin maintenance every 3 weeks, follow-up in 6 weeks Herceptin to complete October 2016 2. Started tamoxifen 20 mg daily 05/01/2015 patient understands that she should not get pregnant on tamoxifen. Plan is to treat her with tamoxifen for 10 years  Tamoxifen toxicities: 1. Mild nausea: I instructed her to take tamoxifen after eating food 2. Mild pinkish discharge after sex: as long as there is no bleeding, or pain, it can be monitored. I discussed with her that potentially her periods could still come back. 3.  Heat intolerance 4. Itching sensation when she gets the hot flashes  Lymphedema: Received physical therapy Chemotherapy-induced anemia: Monitoring hemoglobin 10.7. With MCV 66.2. I suspect that she has thalassemia but also mild case of Iron deficiency. I prescribed her oral iron once daily. Patient would like breast reconstruction. We will assess and see what services may be available for her.  Return to clinic in 6 weeks  No orders of the defined types were placed in this encounter.   The patient has a good understanding of the overall plan. she agrees with it. she will call with any problems that may develop before the next visit  here.   Rulon Eisenmenger, MD

## 2015-07-24 NOTE — Progress Notes (Signed)
Per Dr. Lindi Adie, okay to only give pt 25mg  Benadryl per pt request.

## 2015-07-24 NOTE — Telephone Encounter (Signed)
Appointments made and avs will be printed  And her mammo is at [facility] 09/29/15 10:45

## 2015-07-25 ENCOUNTER — Ambulatory Visit: Payer: Self-pay | Admitting: Physical Therapy

## 2015-07-28 ENCOUNTER — Ambulatory Visit: Payer: Self-pay

## 2015-07-30 ENCOUNTER — Ambulatory Visit: Payer: Self-pay | Admitting: Physical Therapy

## 2015-08-04 ENCOUNTER — Ambulatory Visit: Payer: Self-pay

## 2015-08-06 ENCOUNTER — Ambulatory Visit: Payer: Self-pay | Admitting: Physical Therapy

## 2015-08-06 DIAGNOSIS — M25612 Stiffness of left shoulder, not elsewhere classified: Secondary | ICD-10-CM

## 2015-08-06 DIAGNOSIS — M25512 Pain in left shoulder: Secondary | ICD-10-CM

## 2015-08-06 NOTE — Therapy (Signed)
[place], [place], [address] Phone: [phone]   Fax:  [phone]  Physical Therapy Treatment  Patient Details  Name: [name] MRN: [id number] Date of Birth: 1986-03-14 Referring Provider:  Jovita Kussmaul, MD  Encounter Date: 08/06/2015      PT End of Session - 08/06/15 1034    Visit Number 4   Number of Visits 9   Date for PT Re-Evaluation 08/15/15   PT Start Time 0935   PT Stop Time 1015   PT Time Calculation (min) 40 min   Activity Tolerance Patient tolerated treatment well   Behavior During Therapy [facility] for tasks assessed/performed      Past Medical History  Diagnosis Date  . Cancer     left breast  . Anemia   . Malignant neoplasm of breast (female), unspecified site 07/10/14    left, lower outer    Past Surgical History  Procedure Laterality Date  . Cesarean section    . Left breast biopsy    . Breast lumpectomy Left 07/10/2014    Procedure: LEFT BREAST LUMPECTOMY;  Surgeon: Merrie Roof, MD;  Location: [facility];  Service: General;  Laterality: Left;  . Portacath placement Right 09/06/2014    Procedure: INSERTION PORT-A-CATH;  Surgeon: Autumn Messing III, MD;  Location: [facility];  Service: General;  Laterality: Right;  . Mastectomy w/ sentinel node biopsy Left 09/06/2014    Procedure: LEFT MASTECTOMY WITH SENTINEL LYMPH NODE BIOPSY/NODE MAPPING;  Surgeon: Autumn Messing III, MD;  Location: [facility];  Service: General;  Laterality: Left;    There were no vitals filed for this visit.  Visit Diagnosis:  Stiffness of joint, shoulder region, left  Pain in shoulder region, left  Decreased range of motion of left shoulder      Subjective Assessment - 08/06/15 0936    Subjective "Good."     Currently in Pain? Yes   Pain Score 5   yesterday was ten   Pain Location Head  also a toothache   Pain Descriptors / Indicators Aching   Aggravating Factors  nothing    Pain Relieving Factors ibuprofen            [facility] PT Assessment - 08/06/15 0001    AROM   Left Shoulder Flexion 130 Degrees   Left Shoulder ABduction 99 Degrees                     [facility] Adult PT Treatment/Exercise - 08/06/15 0001    Lumbar Exercises: Supine   Other Supine Lumbar Exercises arms in approx. 90 degrees abduction, lower trunk rotation to right 5x, holding 10 seconds each   Shoulder Exercises: Supine   Other Supine Exercises Reviewed supine scapular osteoporosis HEP series with yellow Theraband, 10 reps today for each.   Shoulder Exercises: Sidelying   Other Sidelying Exercises in right sidelying, left arm D2 actively x 10 for left upper chest stretch   Manual Therapy   Manual Therapy Myofascial release   Myofascial Release Left UE myofascial pulling in supine with abduction motion   Passive ROM In supine, left shoulder passive ER, abduction and flexion to patient tolerance; also for left shoulder horizontal abduction                        Long Term Clinic Goals - 08/06/15 1038    CC Long Term Goal  #1   Title Active  left shoulder flexion at least 150 degrees for improved overhead reach.   Status On-going   CC Long Term Goal  #2   Title Active left shoulder abduction at least 140 degrees for improved ADLs.   Status On-going   CC Long Term Goal  #3   Title Independent with home exercise program.   Status Partially Met   CC Long Term Goal  #4   Title Report pain decrease to 2/10 or less.   Baseline No arm pain complaints today except with stretching, but patient has a headache.   Status On-going   CC Long Term Goal  #5   Title left shoulder active external rotation to at least 80 degrees for improved ADLs   Status On-going            Plan - 08/06/15 1034    Clinical Impression Statement Patient's left shoulder active flexion and abduction have improved since July measurements by about 10 degrees.  She still has pain that  limits PROM in ER, abduction, flexion, and horizontal abduction.  She performed HEP adequately.   Pt will benefit from skilled therapeutic intervention in order to improve on the following deficits Decreased range of motion;Impaired UE functional use;Pain   Rehab Potential Excellent   Clinical Impairments Affecting Rehab Potential previous radiation,lymphedema and PT for painful shoulder movement    PT Frequency 2x / week   PT Duration 4 weeks   PT Treatment/Interventions Manual techniques;Passive range of motion;Therapeutic exercise   PT Next Visit Plan Patient wants to wait to schedule more appointments until she learns about the status of her financial aid application with [employer].  She plans to check on this and then call us.  If she continues, which would be beneficial for her to do, continue therapeutic exercise and manual therapy for left shoulder ROM and function.   PT Home Exercise Plan continue HEP given previously for ROM and do stretches gently   Consulted and Agree with Plan of Care Patient        Problem List Patient Active Problem List   Diagnosis Date Noted  . Hypotension 07/06/2015  . Lymphedema 07/06/2015  . Arm pain 07/06/2015  . Heat intolerance 07/06/2015  . 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 Conjunctivitis 03/13/2015  . Vaginal irritation 02/14/2015  . Breast cancer of lower-outer quadrant of left female breast 07/26/2014    [name],[name] 08/06/2015, 10:41 AM  [place], [place], [address] Phone: [phone]   Fax:  [place], PT 08/06/2015 10:41 AM

## 2015-08-13 ENCOUNTER — Other Ambulatory Visit: Payer: Self-pay | Admitting: *Deleted

## 2015-08-13 ENCOUNTER — Telehealth: Payer: Self-pay | Admitting: Hematology and Oncology

## 2015-08-13 DIAGNOSIS — C50512 Malignant neoplasm of lower-outer quadrant of left female breast: Secondary | ICD-10-CM

## 2015-08-13 NOTE — Telephone Encounter (Signed)
Spoke with patient and she is aware of her echo and i have placed a call to julie as well for the patient

## 2015-08-14 ENCOUNTER — Ambulatory Visit (HOSPITAL_COMMUNITY)
Admission: RE | Admit: 2015-08-14 | Discharge: 2015-08-14 | Disposition: A | Discharge status: Home / Self Care | Payer: Self-pay | Source: Ambulatory Visit | Attending: Hematology and Oncology | Admitting: Hematology and Oncology

## 2015-08-14 ENCOUNTER — Other Ambulatory Visit (HOSPITAL_BASED_OUTPATIENT_CLINIC_OR_DEPARTMENT_OTHER): Payer: Self-pay

## 2015-08-14 ENCOUNTER — Ambulatory Visit (HOSPITAL_BASED_OUTPATIENT_CLINIC_OR_DEPARTMENT_OTHER): Payer: Self-pay

## 2015-08-14 VITALS — BP 100/57 | HR 55 | Temp 97.2°F | Resp 18

## 2015-08-14 DIAGNOSIS — C50512 Malignant neoplasm of lower-outer quadrant of left female breast: Secondary | ICD-10-CM | POA: Insufficient documentation

## 2015-08-14 DIAGNOSIS — Z5112 Encounter for antineoplastic immunotherapy: Secondary | ICD-10-CM

## 2015-08-14 DIAGNOSIS — C773 Secondary and unspecified malignant neoplasm of axilla and upper limb lymph nodes: Secondary | ICD-10-CM

## 2015-08-14 LAB — CBC WITH DIFFERENTIAL/PLATELET
BASO%: 1.4 % (ref 0.0–2.0)
BASOS ABS: 0.1 10*3/uL (ref 0.0–0.1)
EOS%: 5.2 % (ref 0.0–7.0)
Eosinophils Absolute: 0.3 10*3/uL (ref 0.0–0.5)
HCT: 36.3 % (ref 34.8–46.6)
HEMOGLOBIN: 11.4 g/dL — AB (ref 11.6–15.9)
LYMPH%: 38.7 % (ref 14.0–49.7)
MCH: 20.6 pg — AB (ref 25.1–34.0)
MCHC: 31.3 g/dL — AB (ref 31.5–36.0)
MCV: 65.9 fL — ABNORMAL LOW (ref 79.5–101.0)
MONO#: 0.3 10*3/uL (ref 0.1–0.9)
MONO%: 5.7 % (ref 0.0–14.0)
NEUT#: 2.7 10*3/uL (ref 1.5–6.5)
NEUT%: 49 % (ref 38.4–76.8)
Platelets: 225 10*3/uL (ref 145–400)
RBC: 5.51 10*6/uL — ABNORMAL HIGH (ref 3.70–5.45)
RDW: 15.1 % — AB (ref 11.2–14.5)
WBC: 5.5 10*3/uL (ref 3.9–10.3)
lymph#: 2.1 10*3/uL (ref 0.9–3.3)

## 2015-08-14 LAB — COMPREHENSIVE METABOLIC PANEL (CC13)
ALT: 10 U/L (ref 0–55)
AST: 15 U/L (ref 5–34)
Albumin: 4.2 g/dL (ref 3.5–5.0)
Alkaline Phosphatase: 76 U/L (ref 40–150)
Anion Gap: 6 mEq/L (ref 3–11)
BUN: 10.1 mg/dL (ref 7.0–26.0)
CHLORIDE: 109 meq/L (ref 98–109)
CO2: 27 meq/L (ref 22–29)
Calcium: 9.6 mg/dL (ref 8.4–10.4)
Creatinine: 0.7 mg/dL (ref 0.6–1.1)
EGFR: >90 mL/min/{1.73_m2} (ref >90)
GLUCOSE: 89 mg/dL (ref 70–140)
POTASSIUM: 4 meq/L (ref 3.5–5.1)
SODIUM: 141 meq/L (ref 136–145)
Total Bilirubin: 0.48 mg/dL (ref 0.20–1.20)
Total Protein: 7 g/dL (ref 6.4–8.3)

## 2015-08-14 MED ORDER — DIPHENHYDRAMINE HCL 25 MG PO CAPS
50.0000 mg | ORAL_CAPSULE | Freq: Once | ORAL | Status: AC
  Administered 2015-08-14: 50 mg via ORAL

## 2015-08-14 MED ORDER — TRASTUZUMAB CHEMO INJECTION 440 MG
6.0000 mg/kg | Freq: Once | INTRAVENOUS | Status: AC
  Administered 2015-08-14: 294 mg via INTRAVENOUS
  Filled 2015-08-14: qty 14

## 2015-08-14 MED ORDER — HEPARIN SOD (PORK) LOCK FLUSH 100 UNIT/ML IV SOLN
500.0000 [IU] | Freq: Once | INTRAVENOUS | Status: AC | PRN
  Administered 2015-08-14: 500 [IU]
  Filled 2015-08-14: qty 5

## 2015-08-14 MED ORDER — SODIUM CHLORIDE 0.9 % IJ SOLN
10.0000 mL | INTRAMUSCULAR | Status: DC | PRN
  Administered 2015-08-14: 10 mL
  Filled 2015-08-14: qty 10

## 2015-08-14 MED ORDER — SODIUM CHLORIDE 0.9 % IV SOLN
Freq: Once | INTRAVENOUS | Status: AC
  Administered 2015-08-14: 10:00:00 via INTRAVENOUS

## 2015-08-14 MED ORDER — ACETAMINOPHEN 325 MG PO TABS
ORAL_TABLET | ORAL | Status: AC
  Filled 2015-08-14: qty 2

## 2015-08-14 MED ORDER — ACETAMINOPHEN 325 MG PO TABS
650.0000 mg | ORAL_TABLET | Freq: Once | ORAL | Status: AC
  Administered 2015-08-14: 650 mg via ORAL

## 2015-08-14 MED ORDER — DIPHENHYDRAMINE HCL 25 MG PO CAPS
ORAL_CAPSULE | ORAL | Status: AC
Start: 2015-08-14 — End: 2015-08-14
  Filled 2015-08-14: qty 2

## 2015-08-14 NOTE — Progress Notes (Signed)
  Echocardiogram 2D Echocardiogram has been performed.  [name] R 08/14/2015, 10:00 AM

## 2015-08-14 NOTE — Patient Instructions (Signed)

## 2015-09-03 NOTE — Assessment & Plan Note (Signed)
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 Positive for lymphovascular invasion, extracapsular tumor extension grade 3 ER 53%, PR 30%, HER-2 positive ratio 5.95, Ki-67 33%, T2, N3, M0 stage IIIc with high-grade DCIS status post left mastectomy 09/06/2014; completed adjuvant chemotherapy 01/20/2015 and currently on Herceptin maintenance and now getting radiation  Plan:  1. Continue with Herceptin maintenance every 3 weeks, follow-up in 6 weeks Herceptin to complete October 2016 2. Started tamoxifen 20 mg daily 05/01/2015 patient understands that she should not get pregnant on tamoxifen. Plan is to treat her with tamoxifen for 10 years  Tamoxifen toxicities: 1. Mild nausea: I instructed her to take tamoxifen after eating food 2. Mild pinkish discharge after sex: as long as there is no bleeding, or pain, it can be monitored. I discussed with her that potentially her periods could still come back. 3. Heat intolerance 4. Itching sensation when she gets the hot flashes  Lymphedema: Received physical therapy Chemotherapy-induced anemia: Monitoring hemoglobin 10.7. With MCV 66.2. I suspect that she has thalassemia but also mild case of Iron deficiency. I prescribed her oral iron once daily. Patient would like breast reconstruction. We will assess and see what services may be available for her.  Return to clinic in 6 weeks

## 2015-09-04 ENCOUNTER — Encounter: Payer: Self-pay | Admitting: *Deleted

## 2015-09-04 ENCOUNTER — Telehealth: Payer: Self-pay | Admitting: Hematology and Oncology

## 2015-09-04 ENCOUNTER — Other Ambulatory Visit (HOSPITAL_BASED_OUTPATIENT_CLINIC_OR_DEPARTMENT_OTHER): Payer: Self-pay

## 2015-09-04 ENCOUNTER — Ambulatory Visit (HOSPITAL_BASED_OUTPATIENT_CLINIC_OR_DEPARTMENT_OTHER): Payer: Self-pay | Admitting: Hematology and Oncology

## 2015-09-04 ENCOUNTER — Encounter: Payer: Self-pay | Admitting: Hematology and Oncology

## 2015-09-04 ENCOUNTER — Ambulatory Visit (HOSPITAL_BASED_OUTPATIENT_CLINIC_OR_DEPARTMENT_OTHER): Payer: Self-pay

## 2015-09-04 VITALS — BP 96/44 | HR 85 | Temp 98.0°F | Resp 18 | Ht 60.0 in | Wt 110.5 lb

## 2015-09-04 DIAGNOSIS — Z5112 Encounter for antineoplastic immunotherapy: Secondary | ICD-10-CM

## 2015-09-04 DIAGNOSIS — I890 Lymphedema, not elsewhere classified: Secondary | ICD-10-CM

## 2015-09-04 DIAGNOSIS — C50512 Malignant neoplasm of lower-outer quadrant of left female breast: Secondary | ICD-10-CM

## 2015-09-04 DIAGNOSIS — C773 Secondary and unspecified malignant neoplasm of axilla and upper limb lymph nodes: Secondary | ICD-10-CM

## 2015-09-04 DIAGNOSIS — L299 Pruritus, unspecified: Secondary | ICD-10-CM

## 2015-09-04 DIAGNOSIS — D6481 Anemia due to antineoplastic chemotherapy: Secondary | ICD-10-CM

## 2015-09-04 DIAGNOSIS — N951 Menopausal and female climacteric states: Secondary | ICD-10-CM

## 2015-09-04 DIAGNOSIS — N898 Other specified noninflammatory disorders of vagina: Secondary | ICD-10-CM

## 2015-09-04 LAB — CBC WITH DIFFERENTIAL/PLATELET
BASO%: 1.4 % (ref 0.0–2.0)
Basophils Absolute: 0.1 10*3/uL (ref 0.0–0.1)
EOS%: 4.6 % (ref 0.0–7.0)
Eosinophils Absolute: 0.2 10*3/uL (ref 0.0–0.5)
HEMATOCRIT: 34 % — AB (ref 34.8–46.6)
HGB: 10.8 g/dL — ABNORMAL LOW (ref 11.6–15.9)
LYMPH#: 1.5 10*3/uL (ref 0.9–3.3)
LYMPH%: 33.3 % (ref 14.0–49.7)
MCH: 20.6 pg — ABNORMAL LOW (ref 25.1–34.0)
MCHC: 31.6 g/dL (ref 31.5–36.0)
MCV: 65.1 fL — ABNORMAL LOW (ref 79.5–101.0)
MONO#: 0.2 10*3/uL (ref 0.1–0.9)
MONO%: 5.5 % (ref 0.0–14.0)
NEUT%: 55.2 % (ref 38.4–76.8)
NEUTROS ABS: 2.5 10*3/uL (ref 1.5–6.5)
PLATELETS: 199 10*3/uL (ref 145–400)
RBC: 5.23 10*6/uL (ref 3.70–5.45)
RDW: 14.6 % — ABNORMAL HIGH (ref 11.2–14.5)
WBC: 4.5 10*3/uL (ref 3.9–10.3)

## 2015-09-04 LAB — COMPREHENSIVE METABOLIC PANEL (CC13)
AST: 12 U/L (ref 5–34)
Albumin: 4.1 g/dL (ref 3.5–5.0)
Alkaline Phosphatase: 73 U/L (ref 40–150)
Anion Gap: 8 mEq/L (ref 3–11)
BILIRUBIN TOTAL: 0.38 mg/dL (ref 0.20–1.20)
BUN: 9.3 mg/dL (ref 7.0–26.0)
CHLORIDE: 109 meq/L (ref 98–109)
CO2: 26 meq/L (ref 22–29)
CREATININE: 0.7 mg/dL (ref 0.6–1.1)
Calcium: 9.6 mg/dL (ref 8.4–10.4)
EGFR: >90 mL/min/{1.73_m2} (ref >90)
Glucose: 107 mg/dl (ref 70–140)
Potassium: 4.2 mEq/L (ref 3.5–5.1)
Sodium: 143 mEq/L (ref 136–145)
TOTAL PROTEIN: 6.7 g/dL (ref 6.4–8.3)

## 2015-09-04 MED ORDER — SODIUM CHLORIDE 0.9 % IV SOLN
Freq: Once | INTRAVENOUS | Status: AC
  Administered 2015-09-04: 13:00:00 via INTRAVENOUS

## 2015-09-04 MED ORDER — ACETAMINOPHEN 325 MG PO TABS
650.0000 mg | ORAL_TABLET | Freq: Once | ORAL | Status: AC
  Administered 2015-09-04: 650 mg via ORAL

## 2015-09-04 MED ORDER — DIPHENHYDRAMINE HCL 25 MG PO CAPS
ORAL_CAPSULE | ORAL | Status: AC
  Filled 2015-09-04: qty 1

## 2015-09-04 MED ORDER — ACETAMINOPHEN 325 MG PO TABS
ORAL_TABLET | ORAL | Status: AC
  Filled 2015-09-04: qty 1

## 2015-09-04 MED ORDER — SODIUM CHLORIDE 0.9 % IJ SOLN
10.0000 mL | INTRAMUSCULAR | Status: DC | PRN
  Administered 2015-09-04: 10 mL
  Filled 2015-09-04: qty 10

## 2015-09-04 MED ORDER — TRASTUZUMAB CHEMO INJECTION 440 MG
6.0000 mg/kg | Freq: Once | INTRAVENOUS | Status: AC
  Administered 2015-09-04: 294 mg via INTRAVENOUS
  Filled 2015-09-04: qty 14

## 2015-09-04 MED ORDER — HEPARIN SOD (PORK) LOCK FLUSH 100 UNIT/ML IV SOLN
500.0000 [IU] | Freq: Once | INTRAVENOUS | Status: AC | PRN
  Administered 2015-09-04: 500 [IU]
  Filled 2015-09-04: qty 5

## 2015-09-04 MED ORDER — DIPHENHYDRAMINE HCL 25 MG PO CAPS
50.0000 mg | ORAL_CAPSULE | Freq: Once | ORAL | Status: AC
  Administered 2015-09-04: 50 mg via ORAL

## 2015-09-04 NOTE — Patient Instructions (Signed)

## 2015-09-04 NOTE — Progress Notes (Signed)
Patient Care Team: No Pcp Per Patient as PCP - General (General Practice)  DIAGNOSIS: Breast cancer of lower-outer quadrant of left female breast ([place])   Staging form: Breast, AJCC 7th Edition     Clinical: No stage assigned - Unsigned     Pathologic: Stage IIIC (T2, N3a, cM0) - Signed by Rulon Eisenmenger, MD on 09/11/2014   SUMMARY OF ONCOLOGIC HISTORY:   Breast cancer of lower-outer quadrant of left female breast ([place])   06/06/2014 Imaging Palpable Left breast mass at 12:00 position 1.9 cm, initial biopsy 06/12/2014 revealed fibrocystic changes.   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 The genes tested were ATM, BARD1, BRCA1, BRCA2, BRIP1, CDH1, CHEK2, MRE11A, MUTYH, NBN, NF1, PALB2, PTEN, RAD50, RAD51C, RAD51D, and TP53.   10/04/2014 - 01/20/2015 Chemotherapy Patient was started on adjuvant chemotherapy with Taxotere, Carboplatin, Herceptin, Perjeta.     02/11/2015 - 03/25/2015 Radiation Therapy Adjuvant radiation therapy   05/01/2015 -  Anti-estrogen oral therapy tamoxifen 20 mg daily    CHIEF COMPLIANT: last Herceptin treatment  INTERVAL HISTORY: [name] is a 29 year old Hispanic lady with above-mentioned history of HER-2 positive and ER positive breast cancer who is here receiving her last Herceptin treatment. She has tolerated it extremely well. She was also started on tamoxifen in June 2016. She has occasional hot flashes once or twice a day. The heart flashes  accompanied by itching sensation. She is able to manage it fairly well.  REVIEW OF SYSTEMS:   Constitutional: Denies fevers, chills or abnormal weight loss Eyes: Denies blurriness of vision Ears, nose, mouth, throat, and face: Denies mucositis or sore throat Respiratory: Denies cough, dyspnea or wheezes Cardiovascular: Denies palpitation, chest discomfort or lower extremity swelling Gastrointestinal:  Denies nausea, heartburn or change in bowel habits Skin: Denies abnormal skin rashes Lymphatics: Denies new lymphadenopathy or easy bruising Neurological:Denies numbness, tingling or new weaknesses Behavioral/Psych: Mood is stable, no new changes  Breast: intermittent pain in the right breast. All other systems were reviewed with the patient and are negative.  I have reviewed the past medical history, past surgical history, social history and family history with the patient and they are unchanged from previous note.  ALLERGIES:  is allergic to zofran.  MEDICATIONS:  Current Outpatient Prescriptions  Medication Sig Dispense Refill  . acetaminophen (TYLENOL) 500 MG tablet Take 500 mg by mouth every 6 (six) hours as needed.    . Alum & Mag Hydroxide-Simeth (MAGIC MOUTHWASH) SOLN Take 5 mLs by mouth 4 (four) times daily as needed for mouth pain. Swish, gargle and spit one to two teaspoonfuls every six hours as needed. May be swallowed if esophageal involvement. Shake well before using 120 mL 0  . ferrous sulfate 325 (65 FE) MG EC tablet Take 1 tablet (325 mg total) by mouth 3 (three) times daily with meals. Once daily only 30 tablet 3  . gabapentin (NEURONTIN) 300 MG capsule Take 300 mg by mouth 3 (three) times daily.    Marland Kitchen lidocaine-prilocaine (EMLA) cream Apply to affected area once 30 g 3  . Multiple  Vitamin (MULTIVITAMIN WITH MINERALS) TABS tablet Take 1 tablet by mouth daily.    . tamoxifen (NOLVADEX) 20 MG tablet TAKE 1 TABLET BY MOUTH ONCE DAILY 30 tablet 3  . tamoxifen (NOLVADEX) 20 MG  tablet Take 1 tablet (20 mg total) by mouth daily. 90 tablet 1   No current facility-administered medications for this visit.   Facility-Administered Medications Ordered in Other Visits  Medication Dose Route Frequency Provider Last Rate Last Dose  . sodium chloride 0.9 % injection 10 mL  10 mL Intravenous PRN Nicholas Lose, MD   10 mL at 12/09/14 1632    PHYSICAL EXAMINATION: ECOG PERFORMANCE STATUS: 1 - Symptomatic but completely ambulatory  Filed Vitals:   09/04/15 1127  BP: 96/44  Pulse: 85  Temp: 98 F (36.7 C)  Resp: 18   Filed Weights   09/04/15 1127  Weight: 110 lb 8 oz (50.122 kg)    GENERAL:alert, no distress and comfortable SKIN: skin color, texture, turgor are normal, no rashes or significant lesions EYES: normal, Conjunctiva are pink and non-injected, sclera clear OROPHARYNX:no exudate, no erythema and lips, buccal mucosa, and tongue normal  NECK: supple, thyroid normal size, non-tender, without nodularity LYMPH:  no palpable lymphadenopathy in the cervical, axillary or inguinal LUNGS: clear to auscultation and percussion with normal breathing effort HEART: regular rate & rhythm and no murmurs and no lower extremity edema ABDOMEN:abdomen soft, non-tender and normal bowel sounds Musculoskeletal:no cyanosis of digits and no clubbing  NEURO: alert & oriented x 3 with fluent speech, no focal motor/sensory deficits BREASTno palpable lumps or nodules in the right breast where she describes a tenderness. (exam performed in the presence of a chaperone)  LABORATORY DATA:  I have reviewed the data as listed   Chemistry      Component Value Date/Time   NA 141 08/14/2015 1001   NA 140 09/04/2014 1532   K 4.0 08/14/2015 1001   K 3.7 09/04/2014 1532   CL 105 09/04/2014 1532   CO2 27 08/14/2015 1001   CO2 25 09/04/2014 1532   BUN 10.1 08/14/2015 1001   BUN 7 09/04/2014 1532   CREATININE 0.7 08/14/2015 1001   CREATININE 0.58 09/04/2014 1532      Component Value  Date/Time   CALCIUM 9.6 08/14/2015 1001   CALCIUM 9.5 09/04/2014 1532   ALKPHOS 76 08/14/2015 1001   AST 15 08/14/2015 1001   ALT 10 08/14/2015 1001   BILITOT 0.48 08/14/2015 1001       Lab Results  Component Value Date   WBC 4.5 09/04/2015   HGB 10.8* 09/04/2015   HCT 34.0* 09/04/2015   MCV 65.1* 09/04/2015   PLT 199 09/04/2015   NEUTROABS 2.5 09/04/2015   ASSESSMENT & PLAN:  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 Positive for lymphovascular invasion, extracapsular tumor extension grade 3 ER 53%, PR 30%, HER-2 positive ratio 5.95, Ki-67 33%, T2, N3, M0 stage IIIc with high-grade DCIS status post left mastectomy 09/06/2014; completed adjuvant chemotherapy 01/20/2015 and completed Herceptin maintenance October 2016 and radiation started 02/11/2015 completed 03/25/2015 by Dr. Isidore Moos  Plan:  1. Started tamoxifen 20 mg daily 05/01/2015 patient understands that she should not get pregnant on tamoxifen. Plan is to treat her with tamoxifen for 10 years  Tamoxifen toxicities: 1. Mild nausea: I instructed her to take tamoxifen after eating food 2. Mild pinkish discharge  after sex: as long as there is no bleeding, or pain, it can be monitored. I discussed with her that potentially her periods could still come back. 3. Heat intolerance 4. Itching sensation when she gets the hot flashes  Lymphedema: Received physical therapy Chemotherapy-induced anemia: Monitoring hemoglobin 10.7. With MCV 66.2. I suspect that she has thalassemia but also mild case of Iron deficiency. Currently on oral iron daily  Patient would like breast reconstruction. We will assess and see what services may be available for her.  Return to clinic in 3 months   No orders of the defined types were placed in this encounter.   The patient has a good  understanding of the overall plan. she agrees with it. she will call with any problems that may develop before the next visit here.   Rulon Eisenmenger, MD 09/04/2015

## 2015-09-04 NOTE — Addendum Note (Signed)
Addended by: Prentiss Bells on: 09/04/2015 05:37 PM   Modules accepted: Orders, Medications

## 2015-09-04 NOTE — Telephone Encounter (Signed)
Appointments made and patient  Was already on schedule at [facility] 09/29/15 10:45

## 2015-09-04 NOTE — Progress Notes (Signed)
[place] Work  Clinical Social Work was referred by patient navigator for assessment of psychosocial needs due to pt requesting assistance with reconstruction.  Clinical Social Worker researched options for this and met with patient at [facility] assisted by interpreter during infusion. Pt provided with application and information in both Spanish and English on how to apply for assistance through [facility]. Pt reports she had already applied and was waiting return call. Pt states she calls but they never call back. CSW reviewed guidelines in detail with pt and her family and it appears she will not meet several of their requirements. CSW is not aware of another resource currently to address this need, but can continue to research. Pt appreciated information and stated understanding.  Clinical Social Work interventions: Resource education  Loren Racer, [place] Worker [place]  [place] Phone: [phone] Fax: [phone]

## 2015-09-08 ENCOUNTER — Other Ambulatory Visit: Payer: Self-pay | Admitting: General Surgery

## 2015-09-09 IMAGING — CR DG CHEST 1V PORT
1 series · 1 of 1 positions shown · non-contrast
Comparison: None.

CLINICAL DATA: Status post left mastectomy and Port-A-Cath
placement

EXAM:
PORTABLE CHEST - 1 VIEW

[AP]
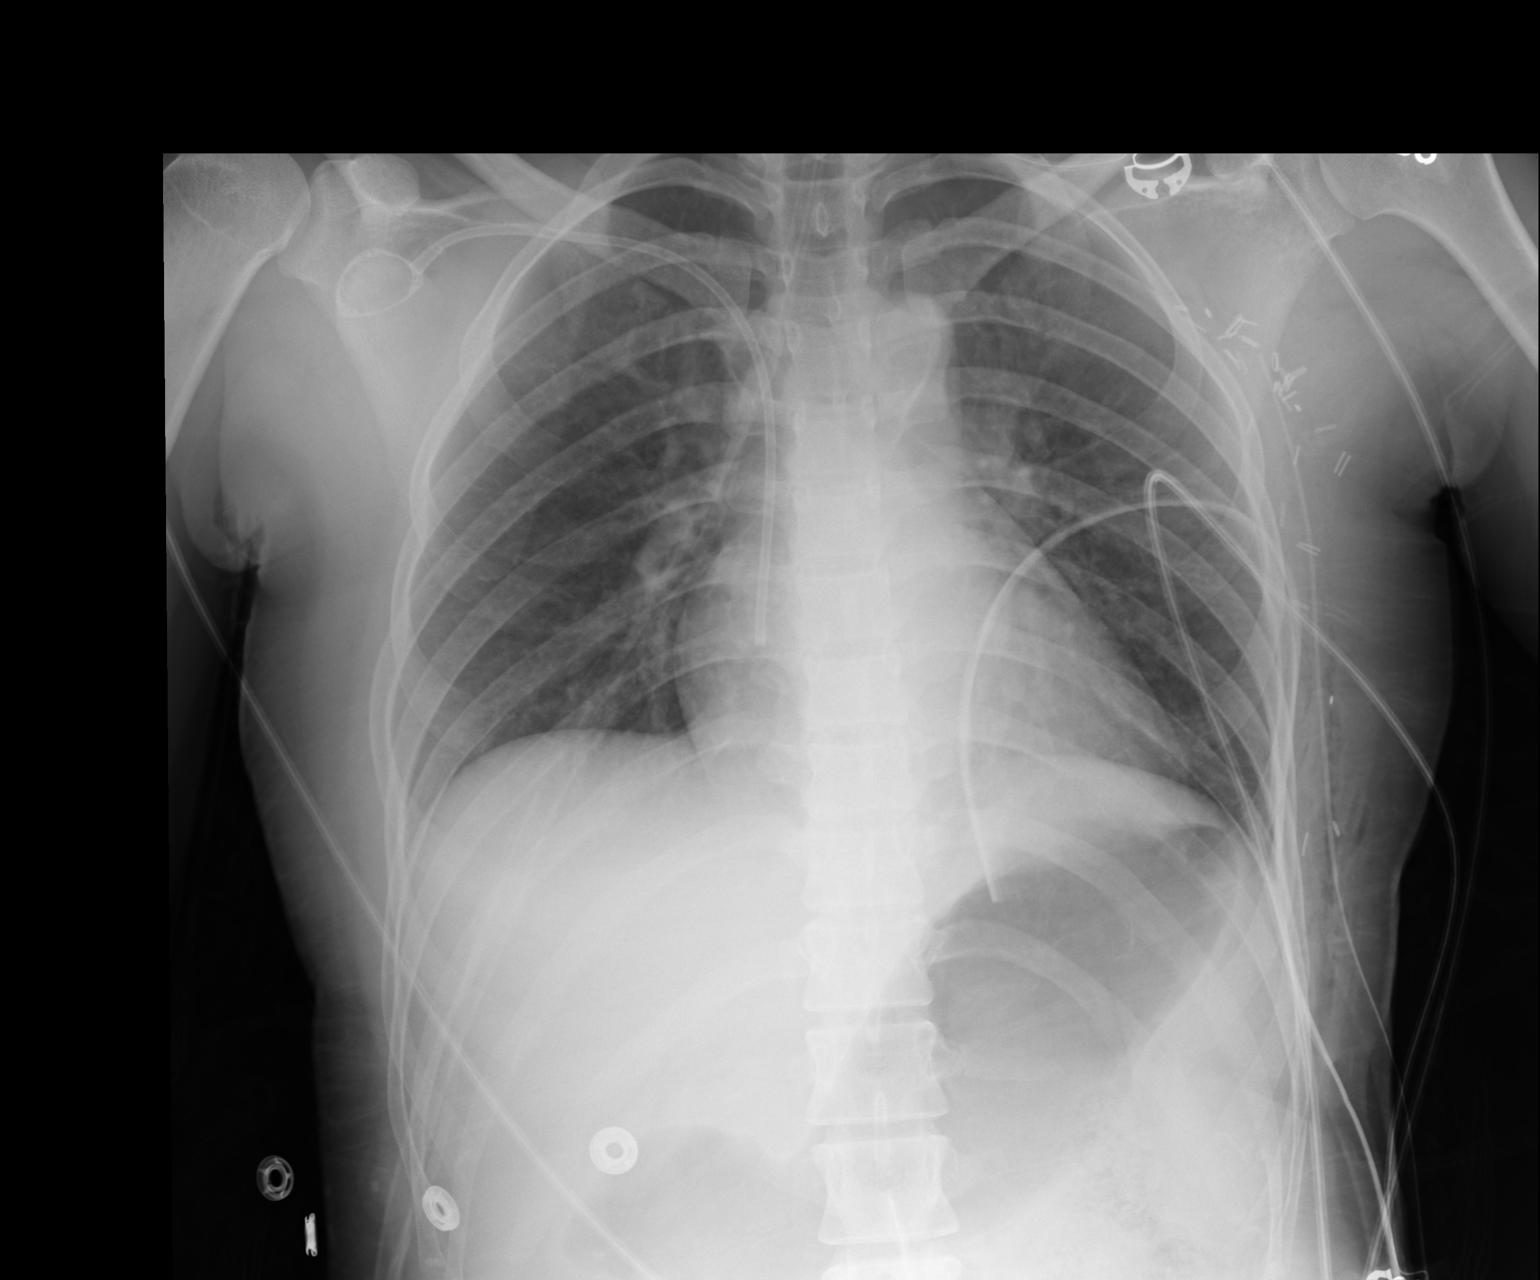

[1 of 1 positions shown; findings below may reference images not displayed]

FINDINGS: Cardiac shadow is within normal limits. Postsurgical changes are
noted on the left. Two surgical drains are seen. A right-sided chest
wall port is noted with the catheter tip in the superior right
atrium. No pneumothorax is noted. No bony abnormality is seen.
IMPRESSION: Postsurgical change as described.

## 2015-09-22 ENCOUNTER — Encounter: Payer: Self-pay | Admitting: Genetic Counselor

## 2015-09-22 DIAGNOSIS — Z1379 Encounter for other screening for genetic and chromosomal anomalies: Secondary | ICD-10-CM | POA: Insufficient documentation

## 2015-10-30 ENCOUNTER — Other Ambulatory Visit: Payer: Self-pay | Admitting: Oncology

## 2015-11-16 DIAGNOSIS — R238 Other skin changes: Secondary | ICD-10-CM

## 2015-11-16 HISTORY — DX: Other skin changes: R23.8

## 2015-11-21 MED FILL — TAMOXIFEN 20 MG TABLET: 20 | 30 days supply | Qty: 30 | Fill #1

## 2015-12-01 ENCOUNTER — Encounter (HOSPITAL_BASED_OUTPATIENT_CLINIC_OR_DEPARTMENT_OTHER): Payer: Self-pay | Admitting: *Deleted

## 2015-12-01 DIAGNOSIS — S025XXA Fracture of tooth (traumatic), initial encounter for closed fracture: Secondary | ICD-10-CM

## 2015-12-01 HISTORY — DX: Fracture of tooth (traumatic), initial encounter for closed fracture: S02.5XXA

## 2015-12-03 NOTE — Assessment & Plan Note (Signed)
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 Positive for lymphovascular invasion, extracapsular tumor extension grade 3 ER 53%, PR 30%, HER-2 positive ratio 5.95, Ki-67 33%, T2, N3, M0 stage IIIc with high-grade DCIS status post left mastectomy 09/06/2014; completed adjuvant chemotherapy 01/20/2015 and completed Herceptin maintenance October 2016 and radiation started 02/11/2015 completed 03/25/2015 by Dr. Isidore Moos  Plan:  1. Started tamoxifen 20 mg daily 05/01/2015 patient understands that she should not get pregnant on tamoxifen. Plan is to treat her with tamoxifen for 10 years  Tamoxifen toxicities: 1. Mild nausea: I instructed her to take tamoxifen after eating food 2. Mild pinkish discharge after sex: as long as there is no bleeding, or pain, it can be monitored. I discussed with her that potentially her periods could still come back. 3. Heat intolerance 4. Itching sensation when she gets the hot flashes  Lymphedema: Received physical therapy Chemotherapy-induced anemia: Monitoring hemoglobin 10.7. With MCV 66.2. I suspect that she has thalassemia but also mild case of Iron deficiency. Currently on oral iron daily  Patient would like breast reconstruction. We will assess and see what services may be available for her.  Return to clinic in 6 months

## 2015-12-04 ENCOUNTER — Encounter: Payer: Self-pay | Admitting: Hematology and Oncology

## 2015-12-04 ENCOUNTER — Telehealth: Payer: Self-pay | Admitting: Hematology and Oncology

## 2015-12-04 ENCOUNTER — Ambulatory Visit (HOSPITAL_BASED_OUTPATIENT_CLINIC_OR_DEPARTMENT_OTHER): Payer: Self-pay | Admitting: Hematology and Oncology

## 2015-12-04 VITALS — BP 94/53 | HR 85 | Temp 98.2°F | Resp 18 | Ht 60.0 in | Wt 114.2 lb

## 2015-12-04 DIAGNOSIS — D6481 Anemia due to antineoplastic chemotherapy: Secondary | ICD-10-CM

## 2015-12-04 DIAGNOSIS — C50512 Malignant neoplasm of lower-outer quadrant of left female breast: Secondary | ICD-10-CM

## 2015-12-04 DIAGNOSIS — C779 Secondary and unspecified malignant neoplasm of lymph node, unspecified: Secondary | ICD-10-CM

## 2015-12-04 DIAGNOSIS — M791 Myalgia: Secondary | ICD-10-CM

## 2015-12-04 DIAGNOSIS — I890 Lymphedema, not elsewhere classified: Secondary | ICD-10-CM

## 2015-12-04 NOTE — Progress Notes (Signed)
Patient Care Team: Nicholas Lose, MD as PCP - General (Hematology and Oncology)  DIAGNOSIS: Breast cancer of lower-outer quadrant of left female breast [facility])   Staging form: Breast, AJCC 7th Edition     Clinical: No stage assigned - Unsigned     Pathologic: Stage IIIC (T2, N3a, cM0) - Signed by Rulon Eisenmenger, MD on 09/11/2014   SUMMARY OF ONCOLOGIC HISTORY:   Breast cancer of lower-outer quadrant of left female breast ([place])   06/06/2014 Imaging Palpable Left breast mass at 12:00 position 1.9 cm, initial biopsy 06/12/2014 revealed fibrocystic changes.   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 The genes tested were ATM, BARD1, BRCA1, BRCA2, BRIP1, CDH1, CHEK2, MRE11A, MUTYH, NBN, NF1, PALB2, PTEN, RAD50, RAD51C, RAD51D, and TP53.   10/04/2014 - 01/20/2015 Chemotherapy Patient was started on adjuvant chemotherapy with Taxotere, Carboplatin, Herceptin, Perjeta.     02/11/2015 - 03/25/2015 Radiation Therapy Adjuvant radiation therapy   05/01/2015 -  Anti-estrogen oral therapy tamoxifen 20 mg daily    CHIEF COMPLIANT: follow-up on tamoxifen  INTERVAL HISTORY: [name] is a 30 year old above-mentioned history of left breast cancer who completed adjuvant chemotherapy and radiation and is now on tamoxifen therapy since June 2016. She complains of musculoskeletal aches and pains as well as severe hot flashes. It appears that these muscle skeletal symptoms have gotten worse over the  past 3 weeks. She also tells me that she can't sleep at bedtime.  REVIEW OF SYSTEMS:   Constitutional: Denies fevers, chills or abnormal weight loss Eyes: Denies blurriness of vision Ears, nose, mouth, throat, and face: Denies mucositis or sore throat Respiratory: Denies cough, dyspnea or wheezes Cardiovascular: Denies palpitation, chest discomfort Gastrointestinal:  Denies nausea, heartburn or change in bowel habits Skin: Denies abnormal skin rashes Lymphatics: Denies new lymphadenopathy or easy bruising Neurological:Denies numbness, tingling or new weaknesses Behavioral/Psych: Mood is stable, no new changes  Extremities: No lower extremity edema  All other systems were reviewed with the patient and are negative.  I have reviewed the past medical history, past surgical history, social history and family history with the patient and they are unchanged from previous note.  ALLERGIES:  is allergic to zofran.  MEDICATIONS:  Current Outpatient Prescriptions  Medication Sig Dispense Refill  . Calcium Carbonate (CALCIUM 600 PO) Take by mouth.    . IRON PO Take by mouth. Nutra Life supplement    . Multiple Vitamin (MULTIVITAMIN) tablet Take 1 tablet by mouth daily.    . tamoxifen (NOLVADEX) 20 MG tablet Take 1 tablet (20 mg total) by mouth daily. 90 tablet 1   No current facility-administered medications for this visit.   Facility-Administered Medications Ordered in Other Visits  Medication Dose Route Frequency Provider Last Rate Last Dose  . sodium chloride 0.9 % injection 10 mL  10 mL Intravenous PRN Nicholas Lose, MD   10 mL at 12/09/14 1632    PHYSICAL EXAMINATION: ECOG PERFORMANCE STATUS: 1 - Symptomatic but completely ambulatory  Filed Vitals:   12/04/15 1132  BP: 94/53  Pulse: 85  Temp: 98.2 F (  36.8 C)  Resp: 18   Filed Weights   12/04/15 1132  Weight: 114 lb 3.2 oz (51.801 kg)    GENERAL:alert, no distress and comfortable SKIN: skin color, texture, turgor are  normal, no rashes or significant lesions EYES: normal, Conjunctiva are pink and non-injected, sclera clear OROPHARYNX:no exudate, no erythema and lips, buccal mucosa, and tongue normal  NECK: supple, thyroid normal size, non-tender, without nodularity LYMPH:  no palpable lymphadenopathy in the cervical, axillary or inguinal LUNGS: clear to auscultation and percussion with normal breathing effort HEART: regular rate & rhythm and no murmurs and no lower extremity edema ABDOMEN:abdomen soft, non-tender and normal bowel sounds MUSCULOSKELETAL:no cyanosis of digits and no clubbing  NEURO: alert & oriented x 3 with fluent speech, no focal motor/sensory deficits EXTREMITIES: No lower extremity edema  LABORATORY DATA:  I have reviewed the data as listed   Chemistry      Component Value Date/Time   NA 143 09/04/2015 1111   NA 140 09/04/2014 1532   K 4.2 09/04/2015 1111   K 3.7 09/04/2014 1532   CL 105 09/04/2014 1532   CO2 26 09/04/2015 1111   CO2 25 09/04/2014 1532   BUN 9.3 09/04/2015 1111   BUN 7 09/04/2014 1532   CREATININE 0.7 09/04/2015 1111   CREATININE 0.58 09/04/2014 1532      Component Value Date/Time   CALCIUM 9.6 09/04/2015 1111   CALCIUM 9.5 09/04/2014 1532   ALKPHOS 73 09/04/2015 1111   AST 12 09/04/2015 1111   ALT <9 09/04/2015 1111   BILITOT 0.38 09/04/2015 1111       Lab Results  Component Value Date   WBC 4.5 09/04/2015   HGB 10.8* 09/04/2015   HCT 34.0* 09/04/2015   MCV 65.1* 09/04/2015   PLT 199 09/04/2015   NEUTROABS 2.5 09/04/2015  [place] ASSESSMENT & PLAN:  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 Positive for lymphovascular invasion, extracapsular tumor extension grade 3 ER 53%, PR 30%, HER-2 positive ratio 5.95, Ki-67 33%, T2, N3, M0 stage IIIc with high-grade DCIS status post left mastectomy  09/06/2014; completed adjuvant chemotherapy 01/20/2015 and completed Herceptin maintenance October 2016 and radiation started 02/11/2015 completed 03/25/2015 by Dr. Isidore Moos  Plan:  1. Started tamoxifen 20 mg daily 05/01/2015 patient understands that she should not get pregnant on tamoxifen. Plan is to treat her with tamoxifen for 10 years  Tamoxifen toxicities: 1. Mild nausea: I instructed her to take tamoxifen after eating food 2. Mild pinkish discharge after sex: resolved 3. Heat intolerance 4. Itching sensation when she gets the hot flashes 5. Musculoskeletal aches and pains: I encouraged her to exercise to relieve the aches and pains as well as to use glucosamine. We also discussed about drinking tonic water to bedtime to help her sleep.  I also discussed about taking tamoxifen in the morning with breakfast or splitting the tamoxifen in 2 to Taking one half in the morning and one half in the evening.  Lymphedema: Received physical therapy Chemotherapy-induced anemia: hemoglobin 10.7. With MCV 66.2. I suspect that she has thalassemia but also mild case of Iron deficiency. Currently on oral iron daily  Return to clinic in 6 months    No orders of the defined types were placed in this encounter.   The patient has a good understanding of the overall plan. she agrees with it. she  will call with any problems that may develop before the next visit here.   Rulon Eisenmenger, MD 12/04/2015

## 2015-12-04 NOTE — Telephone Encounter (Signed)
Gave patient avs report and appointments for July.  °

## 2015-12-05 NOTE — Pre-Procedure Instructions (Signed)
Jamas Lav will be interpreter for pt., per Bethena Roys at [place] for [facility]; please call [phone] if surgery time changes.

## 2015-12-08 ENCOUNTER — Ambulatory Visit (HOSPITAL_BASED_OUTPATIENT_CLINIC_OR_DEPARTMENT_OTHER): Payer: Self-pay | Admitting: Anesthesiology

## 2015-12-08 ENCOUNTER — Encounter (HOSPITAL_BASED_OUTPATIENT_CLINIC_OR_DEPARTMENT_OTHER)
Admission: RE | Disposition: A | Discharge status: Home / Self Care | Payer: Self-pay | Source: Ambulatory Visit | Attending: General Surgery

## 2015-12-08 ENCOUNTER — Ambulatory Visit (HOSPITAL_BASED_OUTPATIENT_CLINIC_OR_DEPARTMENT_OTHER)
Admission: RE | Admit: 2015-12-08 | Discharge: 2015-12-08 | Disposition: A | Discharge status: Home / Self Care | Payer: Self-pay | Source: Ambulatory Visit | Attending: General Surgery | Admitting: General Surgery

## 2015-12-08 ENCOUNTER — Encounter (HOSPITAL_BASED_OUTPATIENT_CLINIC_OR_DEPARTMENT_OTHER): Payer: Self-pay | Admitting: *Deleted

## 2015-12-08 DIAGNOSIS — Z853 Personal history of malignant neoplasm of breast: Secondary | ICD-10-CM | POA: Insufficient documentation

## 2015-12-08 DIAGNOSIS — Z923 Personal history of irradiation: Secondary | ICD-10-CM | POA: Insufficient documentation

## 2015-12-08 DIAGNOSIS — Z9012 Acquired absence of left breast and nipple: Secondary | ICD-10-CM | POA: Insufficient documentation

## 2015-12-08 DIAGNOSIS — Z452 Encounter for adjustment and management of vascular access device: Secondary | ICD-10-CM | POA: Insufficient documentation

## 2015-12-08 DIAGNOSIS — Z9221 Personal history of antineoplastic chemotherapy: Secondary | ICD-10-CM | POA: Insufficient documentation

## 2015-12-08 DIAGNOSIS — Z79810 Long term (current) use of selective estrogen receptor modulators (SERMs): Secondary | ICD-10-CM | POA: Insufficient documentation

## 2015-12-08 HISTORY — DX: Fracture of tooth (traumatic), initial encounter for closed fracture: S02.5XXA

## 2015-12-08 HISTORY — DX: Personal history of antineoplastic chemotherapy: Z92.21

## 2015-12-08 HISTORY — PX: PORT-A-CATH REMOVAL: SHX5289

## 2015-12-08 HISTORY — DX: Personal history of malignant neoplasm of breast: Z85.3

## 2015-12-08 HISTORY — DX: Other skin changes: R23.8

## 2015-12-08 SURGERY — REMOVAL PORT-A-CATH
Anesthesia: General | Site: Chest

## 2015-12-08 MED ORDER — BUPIVACAINE HCL (PF) 0.25 % IJ SOLN
INTRAMUSCULAR | Status: AC
  Filled 2015-12-08: qty 90

## 2015-12-08 MED ORDER — MIDAZOLAM HCL 5 MG/5ML IJ SOLN
INTRAMUSCULAR | Status: DC | PRN
  Administered 2015-12-08: 2 mg via INTRAVENOUS

## 2015-12-08 MED ORDER — LACTATED RINGERS IV SOLN
INTRAVENOUS | Status: DC
  Administered 2015-12-08: 10 mL/h via INTRAVENOUS

## 2015-12-08 MED ORDER — OXYCODONE HCL 5 MG PO TABS
5.0000 mg | ORAL_TABLET | Freq: Once | ORAL | Status: AC | PRN
  Administered 2015-12-08: 5 mg via ORAL

## 2015-12-08 MED ORDER — HYDROMORPHONE HCL 1 MG/ML IJ SOLN: 0.2500 mg | INTRAMUSCULAR | Status: DC | PRN

## 2015-12-08 MED ORDER — OXYCODONE HCL 5 MG PO TABS
ORAL_TABLET | ORAL | Status: AC
  Filled 2015-12-08: qty 1

## 2015-12-08 MED ORDER — OXYCODONE-ACETAMINOPHEN 5-325 MG PO TABS: 1.0000 | ORAL_TABLET | ORAL | Status: DC | PRN

## 2015-12-08 MED ORDER — BUPIVACAINE-EPINEPHRINE (PF) 0.25% -1:200000 IJ SOLN
INTRAMUSCULAR | Status: AC
  Filled 2015-12-08: qty 90

## 2015-12-08 MED ORDER — FENTANYL CITRATE (PF) 100 MCG/2ML IJ SOLN
INTRAMUSCULAR | Status: AC
  Filled 2015-12-08: qty 2

## 2015-12-08 MED ORDER — EPHEDRINE SULFATE 50 MG/ML IJ SOLN
INTRAMUSCULAR | Status: DC | PRN
  Administered 2015-12-08: 10 mg via INTRAVENOUS

## 2015-12-08 MED ORDER — EPHEDRINE SULFATE 50 MG/ML IJ SOLN
INTRAMUSCULAR | Status: AC
  Filled 2015-12-08: qty 1

## 2015-12-08 MED ORDER — MIDAZOLAM HCL 2 MG/2ML IJ SOLN
INTRAMUSCULAR | Status: AC
  Filled 2015-12-08: qty 2

## 2015-12-08 MED ORDER — LIDOCAINE HCL (CARDIAC) 20 MG/ML IV SOLN
INTRAVENOUS | Status: AC
  Filled 2015-12-08: qty 5

## 2015-12-08 MED ORDER — MEPERIDINE HCL 25 MG/ML IJ SOLN
6.2500 mg | INTRAMUSCULAR | Status: DC | PRN
Start: 2015-12-08 — End: 2015-12-08

## 2015-12-08 MED ORDER — PROPOFOL 10 MG/ML IV BOLUS
INTRAVENOUS | Status: DC | PRN
  Administered 2015-12-08: 150 mg via INTRAVENOUS

## 2015-12-08 MED ORDER — PROPOFOL 500 MG/50ML IV EMUL
INTRAVENOUS | Status: AC
  Filled 2015-12-08: qty 50

## 2015-12-08 MED ORDER — OXYCODONE HCL 5 MG/5ML PO SOLN: 5.0000 mg | Freq: Once | ORAL | Status: AC | PRN

## 2015-12-08 MED ORDER — METHYLENE BLUE 1 % INJ SOLN
INTRAMUSCULAR | Status: AC
  Filled 2015-12-08: qty 10

## 2015-12-08 MED ORDER — LIDOCAINE HCL (PF) 1 % IJ SOLN
INTRAMUSCULAR | Status: AC
  Filled 2015-12-08: qty 30

## 2015-12-08 MED ORDER — FENTANYL CITRATE (PF) 100 MCG/2ML IJ SOLN
INTRAMUSCULAR | Status: DC | PRN
  Administered 2015-12-08 (×2): 25 ug via INTRAVENOUS

## 2015-12-08 MED ORDER — LIDOCAINE HCL (CARDIAC) 20 MG/ML IV SOLN
INTRAVENOUS | Status: DC | PRN
  Administered 2015-12-08: 50 mg via INTRAVENOUS

## 2015-12-08 MED ORDER — SODIUM CHLORIDE 0.9 % IJ SOLN
INTRAMUSCULAR | Status: AC
  Filled 2015-12-08: qty 10

## 2015-12-08 MED ORDER — BUPIVACAINE-EPINEPHRINE 0.25% -1:200000 IJ SOLN
INTRAMUSCULAR | Status: DC | PRN
  Administered 2015-12-08: 10 mL

## 2015-12-08 SURGICAL SUPPLY — 23 items
BLADE SURG 15 STRL LF DISP TIS (BLADE) ×1 IMPLANT
BLADE SURG 15 STRL SS (BLADE) ×2
CHLORAPREP W/TINT 26ML (MISCELLANEOUS) ×3 IMPLANT
COVER BACK TABLE 60X90IN (DRAPES) ×3 IMPLANT
COVER MAYO STAND STRL (DRAPES) ×3 IMPLANT
DECANTER SPIKE VIAL GLASS SM (MISCELLANEOUS) ×3 IMPLANT
DRAPE LAPAROTOMY 100X72 PEDS (DRAPES) ×3 IMPLANT
DRAPE UTILITY XL STRL (DRAPES) ×3 IMPLANT
GLOVE BIO SURGEON STRL SZ7.5 (GLOVE) ×3 IMPLANT
GLOVE BIOGEL PI IND STRL 7.0 (GLOVE) ×2 IMPLANT
GLOVE BIOGEL PI INDICATOR 7.0 (GLOVE) ×4
GLOVE ECLIPSE 6.5 STRL STRAW (GLOVE) ×3 IMPLANT
GOWN STRL REUS W/ TWL LRG LVL3 (GOWN DISPOSABLE) ×2 IMPLANT
GOWN STRL REUS W/TWL LRG LVL3 (GOWN DISPOSABLE) ×4
LIQUID BAND (GAUZE/BANDAGES/DRESSINGS) ×3 IMPLANT
NEEDLE HYPO 25X1 1.5 SAFETY (NEEDLE) ×3 IMPLANT
PACK BASIN DAY SURGERY FS (CUSTOM PROCEDURE TRAY) ×3 IMPLANT
SUT MON AB 4-0 PC3 18 (SUTURE) ×3 IMPLANT
SUT VIC AB 3-0 SH 27 (SUTURE) ×4
SUT VIC AB 3-0 SH 27X BRD (SUTURE) ×2 IMPLANT
SYR CONTROL 10ML LL (SYRINGE) ×3 IMPLANT
TOWEL OR 17X24 6PK STRL BLUE (TOWEL DISPOSABLE) ×3 IMPLANT
TOWEL OR NON WOVEN STRL DISP B (DISPOSABLE) ×3 IMPLANT

## 2015-12-08 NOTE — Op Note (Signed)
12/08/2015  9:10 AM  PATIENT:  [name]  30 y.o. female  PRE-OPERATIVE DIAGNOSIS:  Left breast cancer  POST-OPERATIVE DIAGNOSIS:  Left breast cancer  PROCEDURE:  Procedure(s): REMOVAL PORT-A-CATH (N/A)  SURGEON:  Surgeon(s) and Role:    * Jovita Kussmaul, MD - Primary  PHYSICIAN ASSISTANT:   ASSISTANTS: none   ANESTHESIA:   general  EBL:     BLOOD ADMINISTERED:none  DRAINS: none   LOCAL MEDICATIONS USED:  MARCAINE     SPECIMEN:  No Specimen  DISPOSITION OF SPECIMEN:  N/A  COUNTS:  YES  TOURNIQUET:  * No tourniquets in log *  DICTATION: .Dragon Dictation   After informed consent was obtained the patient was brought to the operating room and placed in the supine position on the operating room table. After adequate induction of general anesthesia the patient's right chest was prepped with ChloraPrep, allowed to dry, and draped in usual sterile manner. The area around the port was infiltrated with quarter percent Marcaine with epinephrine until a good field block was created. A small incision was made with a 15 blade knife through her old incision. The incision was carried through the subcutaneous tissue bluntly with a hemostat until the port was identified. The capsule surrounding the port was opened sharply with a 15 blade knife. The 2 anchoring stitches were divided and removed. The port was then gently pushed out of its pocket and with gentle traction was removed from the patient without difficulty. Pressure was held for several minutes until the area was completely hemostatic. The deep layer was closed with interrupted 3-0 Vicryl stitches. The skin was then closed with a running 4-0 Monocryl subcuticular stitch. Dermabond dressings were applied. The patient tolerated the procedure well. At the end of the case all needle sponge and instrument counts were correct. The patient was then awakened and taken to recovery in stable condition.  PLAN OF CARE: Discharge  to home after PACU  PATIENT DISPOSITION:  PACU - hemodynamically stable.   Delay start of Pharmacological VTE agent (>24hrs) due to surgical blood loss or risk of bleeding: not applicable

## 2015-12-08 NOTE — H&P (Signed)
[name]  Location: [facility] Patient #: [phone] DOB: 02-28-1986 Married / Language: Spanish / Race: Refused to Report/Unreported Female   History of Present Illness The patient is a 30 year old female who presents for a follow-up for Breast cancer. 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 She has finished chemotherapy and radiation therapy and is now taking tamoxifen. She is tolerating it well. She is very interested in getting her port removed and possibly seen plastic surgery for reconstruction. Her only complaint is of some soreness along the lateral chest wall   Allergies  Zofran *ANTIEMETICS* No Known Drug Allergies06/29/2016 (Marked as Inactive)  Medication History  Tamoxifen Citrate (10MG  Tablet, Oral) Active. Medications Reconciled    Review of Systems General Not Present- Appetite Loss, Chills, Fatigue, Fever, Night Sweats, Weight Gain and Weight Loss. Skin Not Present- Change in Wart/Mole, Dryness, Hives, Jaundice, New Lesions, Non-Healing Wounds, Rash and Ulcer. HEENT Not Present- Earache, Hearing Loss, Hoarseness, Nose Bleed, Oral Ulcers, Ringing in the Ears, Seasonal Allergies, Sinus Pain, Sore Throat, Visual Disturbances, Wears glasses/contact lenses and Yellow Eyes. Respiratory Not Present- Bloody sputum, Chronic Cough, Difficulty Breathing, Snoring and Wheezing. Breast Not Present- Breast Mass, Breast Pain, Nipple Discharge and Skin Changes. Cardiovascular Not Present- Chest Pain, Difficulty Breathing Lying Down, Leg Cramps, Palpitations, Rapid Heart Rate, Shortness of Breath and Swelling of Extremities. Gastrointestinal Not Present- Abdominal Pain, Bloating, Bloody Stool, Change in Bowel Habits, Chronic diarrhea, Constipation, Difficulty Swallowing, Excessive gas, Gets full quickly at meals,  Hemorrhoids, Indigestion, Nausea, Rectal Pain and Vomiting. Female Genitourinary Not Present- Frequency, Nocturia, Painful Urination, Pelvic Pain and Urgency. Musculoskeletal Not Present- Back Pain, Joint Pain, Joint Stiffness, Muscle Pain, Muscle Weakness and Swelling of Extremities. Neurological Not Present- Decreased Memory, Fainting, Headaches, Numbness, Seizures, Tingling, Tremor, Trouble walking and Weakness. Psychiatric Not Present- Anxiety, Bipolar, Change in Sleep Pattern, Depression, Fearful and Frequent crying. Endocrine Not Present- Cold Intolerance, Excessive Hunger, Hair Changes, Heat Intolerance, Hot flashes and New Diabetes. Hematology Not Present- Easy Bruising, Excessive bleeding, Gland problems, HIV and Persistent Infections.  Vitals  Weight: 111 lb Height: 60in Body Surface Area: 1.45 m Body Mass Index: 21.68 kg/m  Temp.: 97.62F(Temporal)  Pulse: 70 (Regular)  BP: 122/68 (Sitting, Left Arm, Standard)       Physical Exam  General Mental Status-Alert. General Appearance-Consistent with stated age. Hydration-Well hydrated. Voice-Normal.  Head and Neck Head-normocephalic, atraumatic with no lesions or palpable masses. Trachea-midline. Thyroid Gland Characteristics - normal size and consistency.  Eye Eyeball - Bilateral-Extraocular movements intact. Sclera/Conjunctiva - Bilateral-No scleral icterus.  Chest and Lung Exam Chest and lung exam reveals -quiet, even and easy respiratory effort with no use of accessory muscles and on auscultation, normal breath sounds, no adventitious sounds and normal vocal resonance. Inspection Chest Wall - Normal. Back - normal.  Breast Note: The left mastectomy incision has healed nicely with no sign of infection or seroma. There is sensitivity to palpation of the left chest wall. There is no palpable mass of the left chest wall. There is no palpable mass of the right breast. There is no  palpable axillary, supraclavicular, or cervical lymphadenopathy.   Cardiovascular Cardiovascular examination reveals -normal heart sounds, regular rate and rhythm with no murmurs and normal pedal pulses bilaterally.  Abdomen Inspection Inspection of the abdomen reveals - No Hernias. Skin - Scar - no surgical  scars. Palpation/Percussion Palpation and Percussion of the abdomen reveal - Soft, Non Tender, No Rebound tenderness, No Rigidity (guarding) and No hepatosplenomegaly. Auscultation Auscultation of the abdomen reveals - Bowel sounds normal.  Neurologic Neurologic evaluation reveals -alert and oriented x 3 with no impairment of recent or remote memory. Mental Status-Normal.  Musculoskeletal Normal Exam - Left-Upper Extremity Strength Normal and Lower Extremity Strength Normal. Normal Exam - Right-Upper Extremity Strength Normal and Lower Extremity Strength Normal.  Lymphatic Head & Neck  General Head & Neck Lymphatics: Bilateral - Description - Normal. Axillary  General Axillary Region: Bilateral - Description - Normal. Tenderness - Non Tender. Femoral & Inguinal  Generalized Femoral & Inguinal Lymphatics: Bilateral - Description - Normal. Tenderness - Non Tender.    Assessment & Plan  MALIGNANT NEOPLASM OF CENTRAL PORTION OF LEFT FEMALE BREAST (C50.112) Impression: The patient is about 1 year and 3 months status post left modified radical mastectomy for breast cancer. At this point she would like to have her port removed. I have discussed with her in detail the risks and benefits of the operation to remove the port as well as some of the technical aspects and she understands and wishes to proceed. She would also like to meet with plastic surgery to discuss reconstruction options. I will make that referral. Otherwise she will continue to take tamoxifen. I will plan to see her back in about 6 months. We did use the hospital interpreter service. Current Plans Follow  up with [place] in the office in 6 MONTHS.  Call us sooner as needed.  Referred to Surgery - Plastic, for evaluation and follow up (Plastic Surgery). Routine.   Signed by Luella Cook, MD

## 2015-12-08 NOTE — Progress Notes (Signed)
Used spanish interpreter for PACU Phase 2 [place]

## 2015-12-08 NOTE — Transfer of Care (Signed)
Immediate Anesthesia Transfer of Care Note  Patient: [name]  Procedure(s) Performed: Procedure(s): REMOVAL PORT-A-CATH (N/A)  Patient Location: PACU  Anesthesia Type:General  Level of Consciousness: awake and oriented  Airway & Oxygen Therapy: Patient Spontanous Breathing  Post-op Assessment: Report given to RN and Post -op Vital signs reviewed and stable  Post vital signs: Reviewed and stable  Last Vitals: 95/55, 87, 16, 100% Filed Vitals:   12/08/15 0639  BP: 100/66  Pulse: 75  Temp: 36.8 C  Resp: 18    Complications: No apparent anesthesia complications

## 2015-12-08 NOTE — Anesthesia Procedure Notes (Signed)
Procedure Name: LMA Insertion Date/Time: 12/08/2015 8:42 AM Performed by: Bethena Roys T Pre-anesthesia Checklist: Patient identified, Emergency Drugs available, Suction available and Patient being monitored Patient Re-evaluated:Patient Re-evaluated prior to inductionOxygen Delivery Method: Circle System Utilized Preoxygenation: Pre-oxygenation with 100% oxygen Intubation Type: IV induction Ventilation: Mask ventilation without difficulty LMA: LMA inserted LMA Size: 4.0 Number of attempts: 1 Airway Equipment and Method: Bite block Placement Confirmation: positive ETCO2 Dental Injury: Teeth and Oropharynx as per pre-operative assessment

## 2015-12-08 NOTE — Anesthesia Postprocedure Evaluation (Signed)
Anesthesia Post Note  Patient: [name]  Procedure(s) Performed: Procedure(s) (LRB): REMOVAL PORT-A-CATH (N/A)  Patient location during evaluation: PACU Anesthesia Type: General Level of consciousness: awake and alert Pain management: pain level controlled Vital Signs Assessment: post-procedure vital signs reviewed and stable Respiratory status: spontaneous breathing, nonlabored ventilation and respiratory function stable Cardiovascular status: blood pressure returned to baseline and stable Postop Assessment: no signs of nausea or vomiting Anesthetic complications: no    Last Vitals:  Filed Vitals:   12/08/15 1015 12/08/15 1100  BP:  93/58  Pulse: 78 78  Temp: 36.7 C 36.9 C  Resp: 16 16    Last Pain:  Filed Vitals:   12/08/15 1107  PainSc: 3                  Shaana Acocella A

## 2015-12-08 NOTE — Anesthesia Preprocedure Evaluation (Signed)
Anesthesia Evaluation  Patient identified by MRN, date of birth, ID band Patient awake    Reviewed: Allergy & Precautions, NPO status , Patient's Chart, lab work & pertinent test results  Airway Mallampati: I  TM Distance: >3 FB Neck ROM: Full    Dental  (+) Teeth Intact, Dental Advisory Given   Pulmonary  breath sounds clear to auscultation        Cardiovascular Rhythm:Regular Rate:Normal     Neuro/Psych    GI/Hepatic   Endo/Other    Renal/GU      Musculoskeletal   Abdominal   Peds  Hematology   Anesthesia Other Findings   Reproductive/Obstetrics                             Anesthesia Physical Anesthesia Plan  ASA: II  Anesthesia Plan: General   Post-op Pain Management:    Induction: Intravenous  Airway Management Planned: LMA  Additional Equipment:   Intra-op Plan:   Post-operative Plan: Extubation in OR  Informed Consent: I have reviewed the patients History and Physical, chart, labs and discussed the procedure including the risks, benefits and alternatives for the proposed anesthesia with the patient or authorized representative who has indicated his/her understanding and acceptance.   Dental advisory given  Plan Discussed with: CRNA, Anesthesiologist and Surgeon  Anesthesia Plan Comments:         Anesthesia Quick Evaluation  

## 2015-12-08 NOTE — Discharge Instructions (Signed)

## 2015-12-08 NOTE — Interval H&P Note (Signed)
History and Physical Interval Note:  12/08/2015 8:27 AM  [name]  has presented today for surgery, with the diagnosis of Left breast cancer  The various methods of treatment have been discussed with the patient and family. After consideration of risks, benefits and other options for treatment, the patient has consented to  Procedure(s): REMOVAL PORT-A-CATH (N/A) as a surgical intervention .  The patient's history has been reviewed, patient examined, no change in status, stable for surgery.  I have reviewed the patient's chart and labs.  Questions were answered to the patient's satisfaction.     TOTH III,PAUL S

## 2015-12-09 ENCOUNTER — Encounter (HOSPITAL_BASED_OUTPATIENT_CLINIC_OR_DEPARTMENT_OTHER): Payer: Self-pay | Admitting: General Surgery

## 2015-12-26 MED FILL — TAMOXIFEN 20 MG TABLET: 20 | 30 days supply | Qty: 30 | Fill #2

## 2016-02-02 MED FILL — TAMOXIFEN 20 MG TABLET: 20 | 30 days supply | Qty: 30 | Fill #3

## 2016-02-11 ENCOUNTER — Encounter: Payer: Self-pay | Admitting: Hematology and Oncology

## 2016-02-11 ENCOUNTER — Ambulatory Visit (HOSPITAL_BASED_OUTPATIENT_CLINIC_OR_DEPARTMENT_OTHER): Payer: Self-pay | Admitting: Hematology and Oncology

## 2016-02-11 VITALS — BP 102/60 | HR 87 | Temp 98.2°F | Resp 18 | Wt 116.4 lb

## 2016-02-11 DIAGNOSIS — R51 Headache: Secondary | ICD-10-CM

## 2016-02-11 DIAGNOSIS — L589 Radiodermatitis, unspecified: Secondary | ICD-10-CM

## 2016-02-11 DIAGNOSIS — M791 Myalgia: Secondary | ICD-10-CM

## 2016-02-11 DIAGNOSIS — N644 Mastodynia: Secondary | ICD-10-CM

## 2016-02-11 DIAGNOSIS — C50512 Malignant neoplasm of lower-outer quadrant of left female breast: Secondary | ICD-10-CM

## 2016-02-11 NOTE — Progress Notes (Signed)
Patient Care Team: Nicholas Lose, MD as PCP - General (Hematology and Oncology)  DIAGNOSIS: Breast cancer of lower-outer quadrant of left female breast [facility])   Staging form: Breast, AJCC 7th Edition     Clinical: No stage assigned - Unsigned     Pathologic: Stage IIIC (T2, N3a, cM0) - Signed by Rulon Eisenmenger, MD on 09/11/2014   SUMMARY OF ONCOLOGIC HISTORY:   Breast cancer of lower-outer quadrant of left female breast ([place])   06/06/2014 Imaging Palpable Left breast mass at 12:00 position 1.9 cm, initial biopsy 06/12/2014 revealed fibrocystic changes.   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 The genes tested were ATM, BARD1, BRCA1, BRCA2, BRIP1, CDH1, CHEK2, MRE11A, MUTYH, NBN, NF1, PALB2, PTEN, RAD50, RAD51C, RAD51D, and TP53.   10/04/2014 - 01/20/2015 Chemotherapy Patient was started on adjuvant chemotherapy with Taxotere, Carboplatin, Herceptin, Perjeta.     02/11/2015 - 03/25/2015 Radiation Therapy Adjuvant radiation therapy   05/01/2015 -  Anti-estrogen oral therapy tamoxifen 20 mg daily    CHIEF COMPLIANT: Urgent visit for severe right breast pain  INTERVAL HISTORY: [name] is a 30 year old with above-mentioned history of left breast cancer treated with mastectomy followed by adjuvant chemotherapy and radiation and is currently on adjuvant tamoxifen therapy. She is complaining of severe right breast pain for the past 1 week. It is not precipitated by any particular  reason. It comes and goes. There are no aggravating or relieving factors. She rates it as 5-6 out of 10 when it is severe.she tells me that recently she had a mammogram at [facility] which was normal.  REVIEW OF SYSTEMS:   Constitutional: Denies fevers, chills or abnormal weight loss Eyes: Denies blurriness of vision Ears, nose, mouth, throat, and face: Denies mucositis or sore throat Respiratory: Denies cough, dyspnea or wheezes Cardiovascular: Denies palpitation, chest discomfort Gastrointestinal:  Denies nausea, heartburn or change in bowel habits Skin: Denies abnormal skin rashes Lymphatics: Denies new lymphadenopathy or easy bruising Neurological:Denies numbness, tingling or new weaknesses Behavioral/Psych: Mood is stable, no new changes  Extremities: No lower extremity edema Breast: right breast pain. All other systems were reviewed with the patient and are negative.  I have reviewed the past medical history, past surgical history, social history and family history with the patient and they are unchanged from previous note.  ALLERGIES:  is allergic to zofran.  MEDICATIONS:  Current Outpatient Prescriptions  Medication Sig Dispense Refill  . Calcium Carbonate (CALCIUM 600 PO) Take by mouth.    . IRON PO Take by mouth. Nutra Life supplement    . Multiple Vitamin (MULTIVITAMIN) tablet Take 1 tablet by mouth daily.    Marland Kitchen oxyCODONE-acetaminophen (ROXICET) 5-325 MG tablet Take 1-2 tablets by mouth every 4 (four) hours as needed. 30 tablet 0  . tamoxifen (NOLVADEX) 20 MG tablet Take 1 tablet (20 mg total) by mouth daily. 90 tablet 1   No current facility-administered medications for this visit.   Facility-Administered Medications Ordered in Other Visits  Medication Dose Route Frequency Provider Last Rate Last Dose  . sodium chloride 0.9 %  injection 10 mL  10 mL Intravenous PRN Nicholas Lose, MD   10 mL at 12/09/14 1632    PHYSICAL EXAMINATION: ECOG PERFORMANCE STATUS: 1 - Symptomatic  but completely ambulatory  Filed Vitals:   02/11/16 1509  BP: 102/60  Pulse: 87  Temp: 98.2 F (36.8 C)  Resp: 18   Filed Weights   02/11/16 1509  Weight: 116 lb 6.4 oz (52.799 kg)    GENERAL:alert, no distress and comfortable SKIN: skin color, texture, turgor are normal, no rashes or significant lesions EYES: normal, Conjunctiva are pink and non-injected, sclera clear OROPHARYNX:no exudate, no erythema and lips, buccal mucosa, and tongue normal  NECK: supple, thyroid normal size, non-tender, without nodularity LYMPH:  no palpable lymphadenopathy in the cervical, axillary or inguinal LUNGS: clear to auscultation and percussion with normal breathing effort HEART: regular rate & rhythm and no murmurs and no lower extremity edema ABDOMEN:abdomen soft, non-tender and normal bowel sounds MUSCULOSKELETAL:no cyanosis of digits and no clubbing  NEURO: alert & oriented x 3 with fluent speech, no focal motor/sensory deficits EXTREMITIES: No lower extremity edema BREAST: No palpable masses or nodules in either right or left breasts. No palpable axillary supraclavicular or infraclavicular adenopathy no breast tenderness or nipple discharge. (exam performed in the presence of a chaperone)  LABORATORY DATA:  I have reviewed the data as listed   Chemistry      Component Value Date/Time   NA 143 09/04/2015 1111   NA 140 09/04/2014 1532   K 4.2 09/04/2015 1111   K 3.7 09/04/2014 1532   CL 105 09/04/2014 1532   CO2 26 09/04/2015 1111   CO2 25 09/04/2014 1532   BUN 9.3 09/04/2015 1111   BUN 7 09/04/2014 1532   CREATININE 0.7 09/04/2015 1111   CREATININE 0.58 09/04/2014 1532      Component Value Date/Time   CALCIUM 9.6 09/04/2015 1111   CALCIUM 9.5 09/04/2014 1532   ALKPHOS 73 09/04/2015 1111   AST 12 09/04/2015 1111   ALT <9 09/04/2015 1111   BILITOT 0.38 09/04/2015 1111       Lab Results  Component Value Date   WBC 4.5 09/04/2015   HGB 10.8* 09/04/2015   HCT 34.0*  09/04/2015   MCV 65.1* 09/04/2015   PLT 199 09/04/2015   NEUTROABS 2.5 09/04/2015     ASSESSMENT & PLAN:  Breast cancer of lower-outer quadrant of left female breast 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 Positive for lymphovascular invasion, extracapsular tumor extension grade 3 ER 53%, PR 30%, HER-2 positive ratio 5.95, Ki-67 33%, T2, N3, M0 stage IIIc with high-grade DCIS status post left mastectomy 09/06/2014; completed adjuvant chemotherapy 01/20/2015 and completed Herceptin maintenance October 2016 and radiation started 02/11/2015 completed 03/25/2015 by Dr. Isidore Moos  Current treatment: Tamoxifen 20 mg daily started 05/01/2015 10 years Tamoxifen toxicities: 1. Mild nausea: I instructed her to take tamoxifen after eating food 2. Mild pinkish discharge after sex: resolved 3. Heat intolerance 4. Itching sensation when she gets the hot flashes 5. Musculoskeletal aches and pains: I encouraged her to exercise to relieve the aches and pains as well as to use glucosamine. We also discussed about drinking tonic water to bedtime to help her sleep.  Severe right breast pain causing headaches: Clinical examination does not reveal any palpable lumps or nodules. Most recently she has had a mammogram which was apparently normal. I'm requesting a copy of this report.  I discussed with her that these symptoms could be related to hormonal changes. I instructed her to wait 1-2 weeks after her cyclosporine complete. If she continues to have persistent symptoms and I will try to obtain a repeat mammogram/ultrasound.  Return to clinic in July for follow-up as previously scheduled.   No orders of the defined types were placed in this encounter.   The patient has a good understanding of the overall plan. she agrees with it. she will call with any problems that may develop before the next visit here.    Rulon Eisenmenger, MD 02/11/2016

## 2016-02-11 NOTE — Assessment & Plan Note (Signed)
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 Positive for lymphovascular invasion, extracapsular tumor extension grade 3 ER 53%, PR 30%, HER-2 positive ratio 5.95, Ki-67 33%, T2, N3, M0 stage IIIc with high-grade DCIS status post left mastectomy 09/06/2014; completed adjuvant chemotherapy 01/20/2015 and completed Herceptin maintenance October 2016 and radiation started 02/11/2015 completed 03/25/2015 by Dr. Isidore Moos  Current treatment: Tamoxifen 20 mg daily started 05/01/2015 10 years Tamoxifen toxicities: 1. Mild nausea: I instructed her to take tamoxifen after eating food 2. Mild pinkish discharge after sex: resolved 3. Heat intolerance 4. Itching sensation when she gets the hot flashes 5. Musculoskeletal aches and pains: I encouraged her to exercise to relieve the aches and pains as well as to use glucosamine. We also discussed about drinking tonic water to bedtime to help her sleep.  Severe right breast pain causing headaches: Clinical examination does not reveal any palpable lumps or nodules. Most recently she has had a mammogram which was apparently normal. I'm requesting a copy of this report. I discussed with her that these symptoms could be related to hormonal changes. I instructed her to wait 1-2 weeks after her cyclosporine complete. If she continues to have persistent symptoms and I will try to obtain a repeat mammogram/ultrasound.  Return to clinic in July for follow-up as previously scheduled.

## 2016-02-11 NOTE — Progress Notes (Signed)
Unable to get in to exam room prior to MD.  No assessment performed.  

## 2016-03-10 ENCOUNTER — Other Ambulatory Visit: Payer: Self-pay | Admitting: *Deleted

## 2016-03-10 ENCOUNTER — Ambulatory Visit (HOSPITAL_BASED_OUTPATIENT_CLINIC_OR_DEPARTMENT_OTHER): Payer: Self-pay | Admitting: Nurse Practitioner

## 2016-03-10 VITALS — BP 102/62 | HR 69 | Temp 98.7°F | Resp 18

## 2016-03-10 DIAGNOSIS — C50512 Malignant neoplasm of lower-outer quadrant of left female breast: Secondary | ICD-10-CM

## 2016-03-10 DIAGNOSIS — I890 Lymphedema, not elsewhere classified: Secondary | ICD-10-CM

## 2016-03-11 ENCOUNTER — Encounter: Payer: Self-pay | Admitting: Nurse Practitioner

## 2016-03-11 NOTE — Progress Notes (Signed)
SYMPTOM MANAGEMENT CLINIC    Chief Complaint: EDEMA  HPI:  [name] 30 y.o. female diagnosed with breast cancer.  Patient is status post mastectomy, chemotherapy, and radiation treatments.  Currently undergoing tamoxifen oral therapy.   Patient presented to the [facility] today with complaint of edema to her left fingers.  Patient has a history of chronic lymphedema to the left upper extremity; and presented to the cancer Center today.  Wearing her left arm compression sleeve and a left hand compression garment as well.  Patient denies any injury or trauma to her left upper extremity.  Chills denies any pain to her arm.  Exam today reveals mild lymphedema to the entire left arm; and some increased lymphedema to the left ring finger.  There is no tenderness, erythema, warmth, or red streaks to the site.  Advice patient would order a lymphedema clinic referral for further evaluation of patient's new onset lymphedema.  Patient may also and affected from a refitting/new compression sleeve and a compression garment for her left hand that compresses the fingers as well.  Patient was advised to call/return or go directly to the emergency department for any worsening symptoms whatsoever.  Note: PT WILL NEED A TRANSLATOR FOR ANY VISITS SHE MAY BE SCHEDULED FOR.     Breast cancer of lower-outer quadrant of left female breast ([place])   06/06/2014 Imaging Palpable Left breast mass at 12:00 position 1.9 cm, initial biopsy 06/12/2014 revealed fibrocystic changes.   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 The genes tested were ATM, BARD1, BRCA1, BRCA2, BRIP1, CDH1, CHEK2, MRE11A, MUTYH, NBN, NF1, PALB2, PTEN, RAD50, RAD51C, RAD51D, and TP53.   10/04/2014 - 01/20/2015 Chemotherapy Patient was started on adjuvant chemotherapy with Taxotere, Carboplatin, Herceptin, Perjeta.     02/11/2015 - 03/25/2015 Radiation Therapy Adjuvant radiation therapy   05/01/2015 -  Anti-estrogen oral therapy tamoxifen 20 mg daily    Review of Systems  Constitutional:       Complaint of left upper extremity edema.  All other systems reviewed and are negative.   Past Medical History  Diagnosis Date  . History of breast cancer 2015    left  . Anemia     takes iron supplement  . Multiple blisters 11/2015    tongue - states from chemo  . Broken tooth 12/01/2015    left upper back  . History of chemotherapy     finished 07/2015    Past Surgical History  Procedure Laterality Date  . Cesarean section  09/09/2007  . Breast lumpectomy Left 07/10/2014    Procedure: LEFT BREAST LUMPECTOMY;  Surgeon: Merrie Roof, MD;  Location: [facility];  Service: General;  Laterality: Left;  . Portacath placement Right 09/06/2014    Procedure: INSERTION PORT-A-CATH;  Surgeon: Autumn Messing III, MD;  Location: [place];  Service: General;  Laterality: Right;  . Mastectomy w/ sentinel node biopsy Left 09/06/2014    Procedure: LEFT MASTECTOMY WITH SENTINEL LYMPH NODE BIOPSY/NODE MAPPING;  Surgeon: Autumn Messing III, MD;  Location: [facility];  Service:  General;  Laterality: Left;  . Port-a-cath removal N/A 12/08/2015    Procedure: REMOVAL PORT-A-CATH;  Surgeon: Autumn Messing III, MD;  Location: [place];  Service: General;  Laterality: N/A;    has Breast cancer of lower-outer quadrant of left female breast ([place]); Vaginal irritation; Radiation dermatitis; Lymphedema; Arm pain; Heat intolerance; and  Genetic testing on her problem list.    is allergic to zofran.    Medication List       This list is accurate as of: 03/10/16 11:59 PM.  Always use your most recent med list.               CALCIUM 600 PO  Take by mouth.     IRON PO  Take by mouth. Nutra Life supplement     multivitamin tablet  Take 1 tablet by mouth daily.     oxyCODONE-acetaminophen 5-325 MG tablet  Commonly known as:  ROXICET  Take 1-2 tablets by mouth every 4 (four) hours as needed.     tamoxifen 20 MG tablet  Commonly known as:  NOLVADEX  Take 1 tablet (20 mg total) by mouth daily.         PHYSICAL EXAMINATION  Oncology Vitals 03/10/2016 02/11/2016  Height - -  Weight - 52.799 kg  Weight (lbs) - 116 lbs 6 oz  BMI (kg/m2) - -  Temp 98.7 98.2  Pulse 69 87  Resp 18 18  SpO2 100 100  BSA (m2) - -   BP Readings from Last 2 Encounters:  03/10/16 102/62  02/11/16 102/60    Physical Exam  Constitutional: She is well-developed, well-nourished, and in no distress.  HENT:  Head: Normocephalic and atraumatic.  Eyes: Conjunctivae and EOM are normal. Pupils are equal, round, and reactive to light. Right eye exhibits no discharge. Left eye exhibits no discharge. No scleral icterus.  Neck: Normal range of motion.  Pulmonary/Chest: Effort normal. No respiratory distress.  Musculoskeletal: Normal range of motion. She exhibits edema. She exhibits no tenderness.  Exam today reveals mild lymphedema to the entire left arm; and some increased lymphedema to the left ring finger.  There is no tenderness, erythema, warmth, or red streaks to the site.     Nursing note and vitals reviewed.   LABORATORY DATA:. No visits with results within 3 Day(s) from this visit. Latest known visit with results is:  Appointment on 09/04/2015  Component Date Value Ref Range Status  . WBC 09/04/2015 4.5  3.9 - 10.3 10e3/uL Final  . NEUT# 09/04/2015 2.5  1.5 - 6.5 10e3/uL Final  . HGB 09/04/2015 10.8* 11.6 - 15.9 g/dL  Final  . HCT 09/04/2015 34.0* 34.8 - 46.6 % Final  . Platelets 09/04/2015 199  145 - 400 10e3/uL Final  . MCV 09/04/2015 65.1* 79.5 - 101.0 fL Final  . MCH 09/04/2015 20.6* 25.1 - 34.0 pg Final  . MCHC 09/04/2015 31.6  31.5 - 36.0 g/dL Final  . RBC 09/04/2015 5.23  3.70 - 5.45 10e6/uL Final  . RDW 09/04/2015 14.6* 11.2 - 14.5 % Final  . lymph# 09/04/2015 1.5  0.9 - 3.3 10e3/uL Final  . MONO# 09/04/2015 0.2  0.1 - 0.9 10e3/uL Final  . Eosinophils Absolute 09/04/2015 0.2  0.0 - 0.5 10e3/uL Final  . Basophils Absolute 09/04/2015 0.1  0.0 - 0.1 10e3/uL Final  . NEUT% 09/04/2015 55.2  38.4 - 76.8 % Final  . LYMPH% 09/04/2015 33.3  14.0 - 49.7 % Final  . MONO% 09/04/2015 5.5  0.0 -  14.0 % Final  . EOS% 09/04/2015 4.6  0.0 - 7.0 % Final  . BASO% 09/04/2015 1.4  0.0 - 2.0 % Final  . Sodium 09/04/2015 143  136 - 145 mEq/L Final  . Potassium 09/04/2015 4.2  3.5 - 5.1 mEq/L Final  . Chloride 09/04/2015 109  98 - 109 mEq/L Final  . CO2 09/04/2015 26  22 - 29 mEq/L Final  . Glucose 09/04/2015 107  70 - 140 mg/dl Final   Glucose reference range is for nonfasting patients. Fasting glucose reference range is 70- 100.  Marland Kitchen BUN 09/04/2015 9.3  7.0 - 26.0 mg/dL Final  . Creatinine 09/04/2015 0.7  0.6 - 1.1 mg/dL Final  . Total Bilirubin 09/04/2015 0.38  0.20 - 1.20 mg/dL Final  . Alkaline Phosphatase 09/04/2015 73  40 - 150 U/L Final  . AST 09/04/2015 12  5 - 34 U/L Final  . ALT 09/04/2015 <9  0 - 55 U/L Final  . Total Protein 09/04/2015 6.7  6.4 - 8.3 g/dL Final  . Albumin 09/04/2015 4.1  3.5 - 5.0 g/dL Final  . Calcium 09/04/2015 9.6  8.4 - 10.4 mg/dL Final  . Anion Gap 09/04/2015 8  3 - 11 mEq/L Final  . EGFR 09/04/2015 >90  >90 ml/min/1.73 m2 Final   eGFR is calculated using the CKD-EPI Creatinine Equation (2009)    RADIOGRAPHIC STUDIES: No results found.  ASSESSMENT/PLAN:    Breast cancer of lower-outer quadrant of left female breast Patient underwent a left breast mastectomy in October  2015.  She completed chemotherapy in March 2016 and completed radiation treatments in May 2016.  She is currently undergoing tamoxifen oral therapy only.  Patient is scheduled to return on 06/03/2016 for follow-up visit.  Lymphedema Patient presented to the [facility] today with complaint of edema to her left fingers.  Patient has a history of chronic lymphedema to the left upper extremity; and presented to the cancer Center today.  Wearing her left arm compression sleeve and a left hand compression garment as well.  Patient denies any injury or trauma to her left upper extremity.  Chills denies any pain to her arm.  Exam today reveals mild lymphedema to the entire left arm; and some increased lymphedema to the left ring finger.  There is no tenderness, erythema, warmth, or red streaks to the site.  Advice patient would order a lymphedema clinic referral for further evaluation of patient's new onset lymphedema.  Patient may also and affected from a refitting/new compression sleeve and a compression garment for her left hand that compresses the fingers as well.  Patient was advised to call/return or go directly to the emergency department for any worsening symptoms whatsoever.  Note: PT WILL NEED A TRANSLATOR FOR ANY VISITS SHE MAY BE SCHEDULED FOR.    Patient stated understanding of all instructions; and was in agreement with this plan of care. The patient knows to call the clinic with any problems, questions or concerns.   Total time spent with patient was 15 minutes;  with greater than 75 percent of that time spent in face to face counseling regarding patient's symptoms,  and coordination of care and follow up.  Disclaimer:This dictation was prepared with Dragon/digital dictation along with [employer]. Any transcriptional errors that result from this process are unintentional.  Drue Second, NP 03/11/2016

## 2016-03-11 NOTE — Assessment & Plan Note (Signed)
Patient presented to the [facility] today with complaint of edema to her left fingers.  Patient has a history of chronic lymphedema to the left upper extremity; and presented to the cancer Center today.  Wearing her left arm compression sleeve and a left hand compression garment as well.  Patient denies any injury or trauma to her left upper extremity.  Chills denies any pain to her arm.  Exam today reveals mild lymphedema to the entire left arm; and some increased lymphedema to the left ring finger.  There is no tenderness, erythema, warmth, or red streaks to the site.  Advice patient would order a lymphedema clinic referral for further evaluation of patient's new onset lymphedema.  Patient may also and affected from a refitting/new compression sleeve and a compression garment for her left hand that compresses the fingers as well.  Patient was advised to call/return or go directly to the emergency department for any worsening symptoms whatsoever.  Note: PT WILL NEED A TRANSLATOR FOR ANY VISITS SHE MAY BE SCHEDULED FOR.

## 2016-03-11 NOTE — Assessment & Plan Note (Signed)
Patient underwent a left breast mastectomy in October 2015.  She completed chemotherapy in March 2016 and completed radiation treatments in May 2016.  She is currently undergoing tamoxifen oral therapy only.  Patient is scheduled to return on 06/03/2016 for follow-up visit.

## 2016-03-13 IMAGING — CT CT HEAD W/ CM
1 of 2 series · 14 of 30 positions shown, 18 images · IV contrast (omnipaque)
Comparison: PET-CT 10/11/2014. Head CT without contrast 08/30/2013.

CLINICAL DATA: 29-year-old female with right side headache and
visual disturbance since this morning. Current history of breast
cancer. Subsequent encounter.

EXAM:
CT HEAD WITHOUT AND WITH CONTRAST
TECHNIQUE: Contiguous axial images were obtained from the base of the skull
through the vertex without and with intravenous contrast
CONTRAST:  100mL OMNIPAQUE IOHEXOL 300 MG/ML  SOLN

[Series 3: head w/cm 4.8 h45s · axial · 0.43mm/px · z∈[+1356,+1499]mm · 14 of 36 slices shown, 18 images]
[im 3/36  brain]
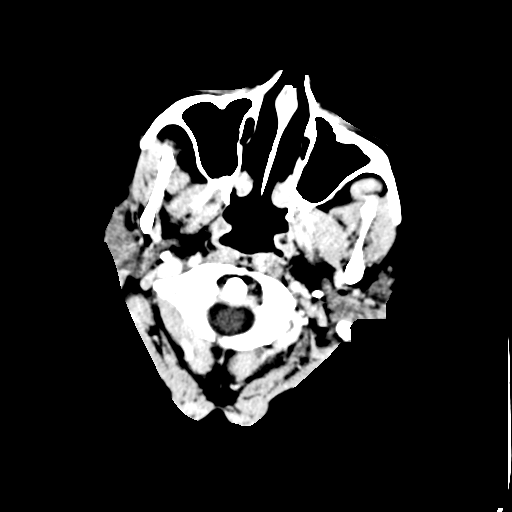
[im 3/36  bone]
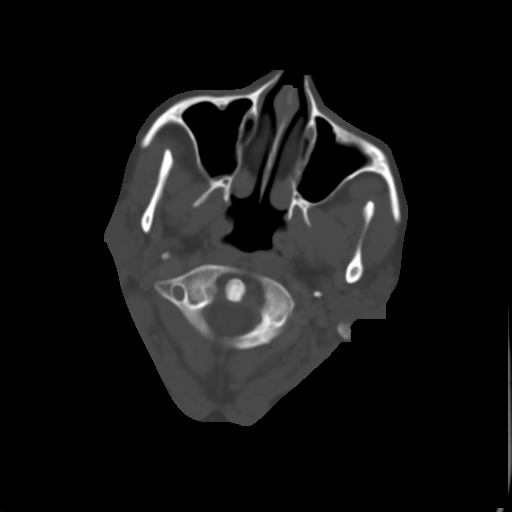
[im 5/36  brain]
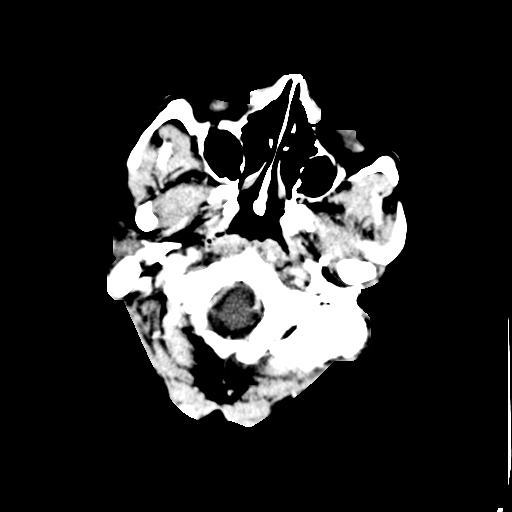
[im 8/36  brain]
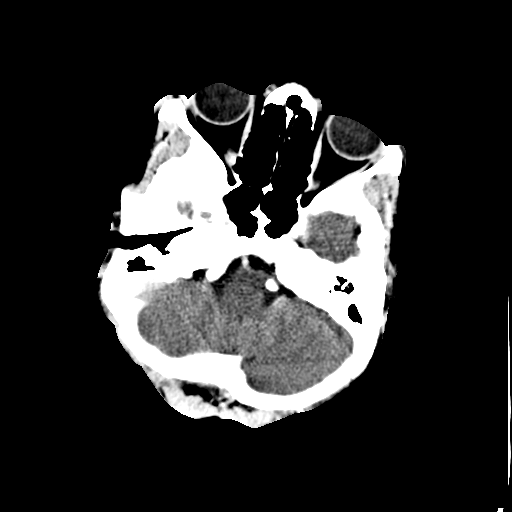
[im 10/36  brain]
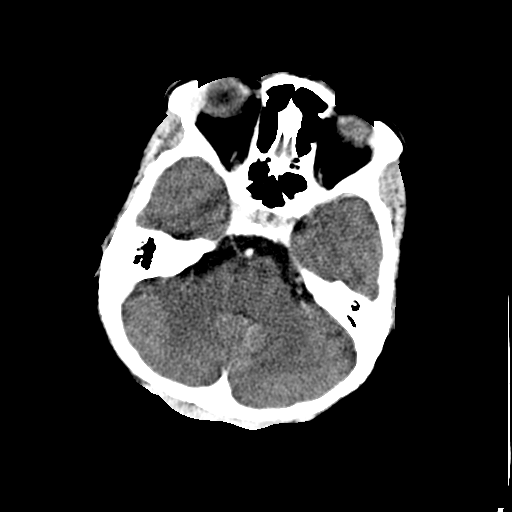
[im 12/36  brain]
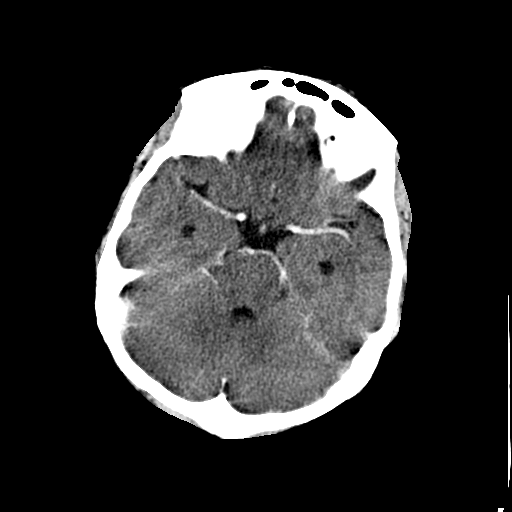
[im 12/36  bone]
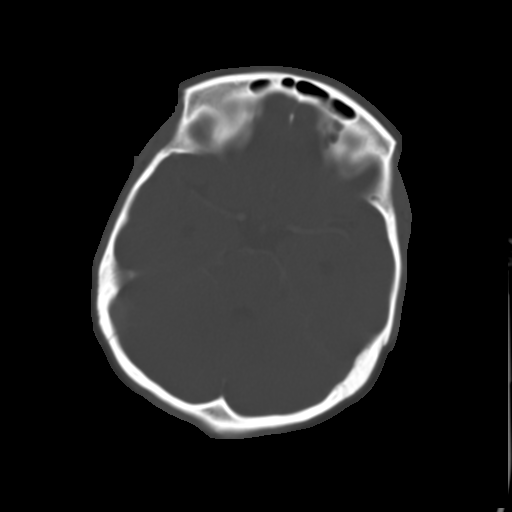
[im 15/36  brain]
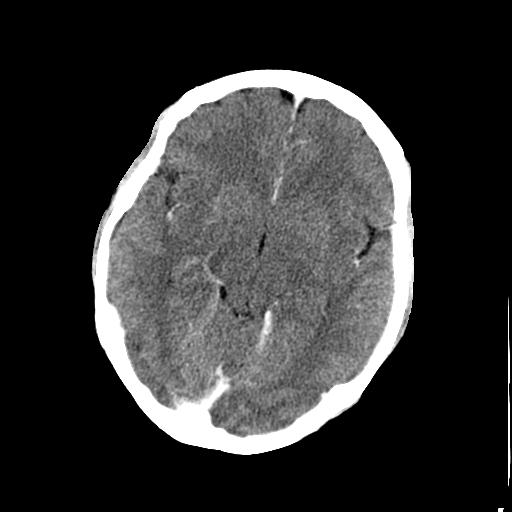
[im 17/36  brain]
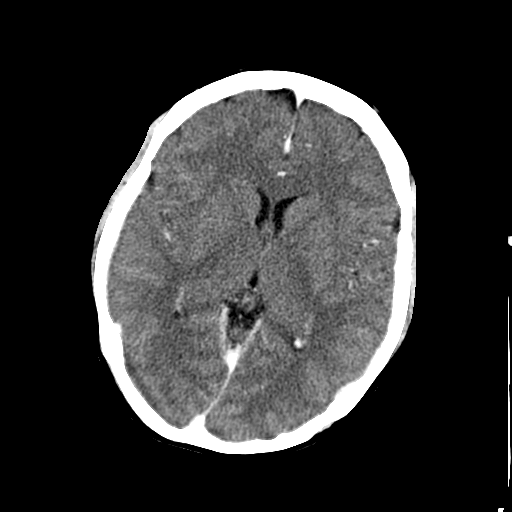
[im 19/36  brain]
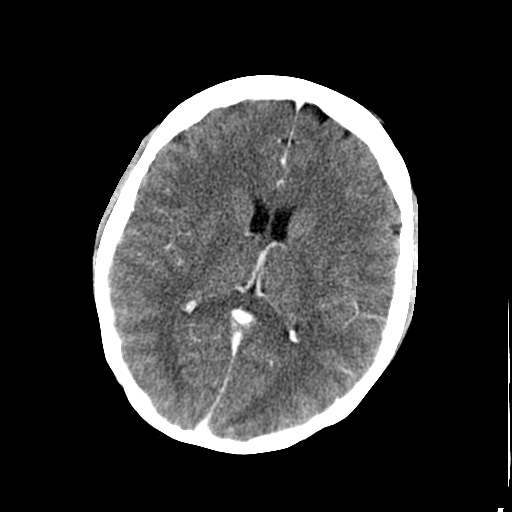
[im 22/36  brain]
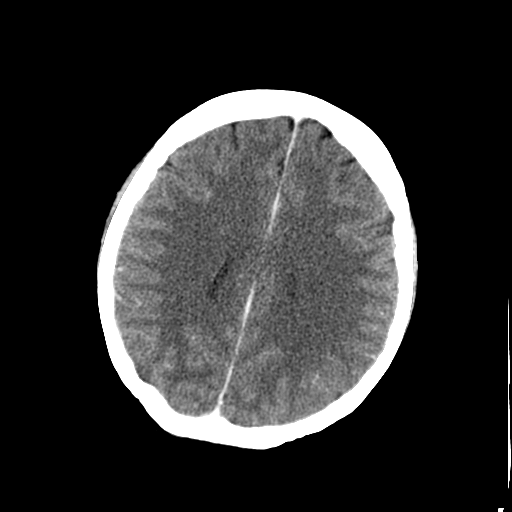
[im 22/36  bone]
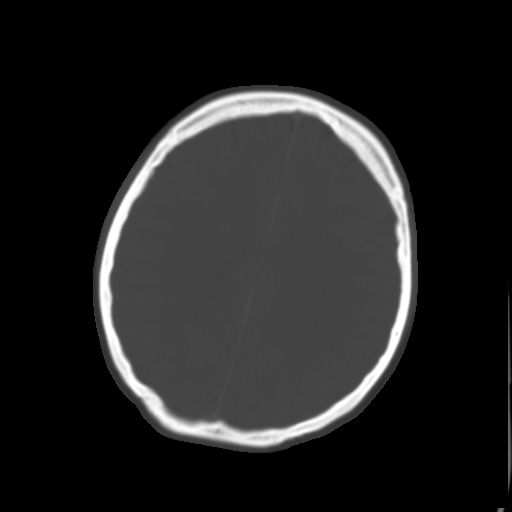
[im 24/36  brain]
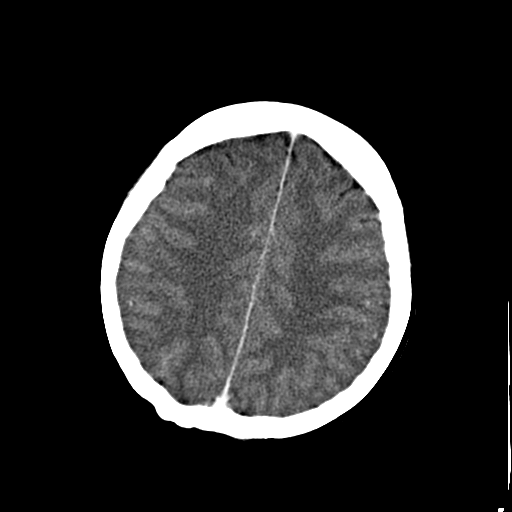
[im 26/36  brain]
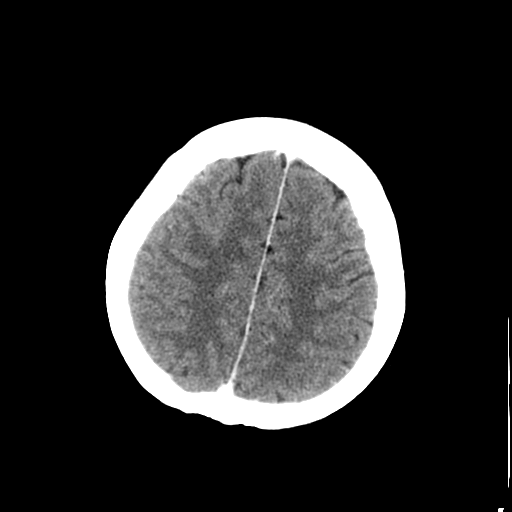
[im 29/36  brain]
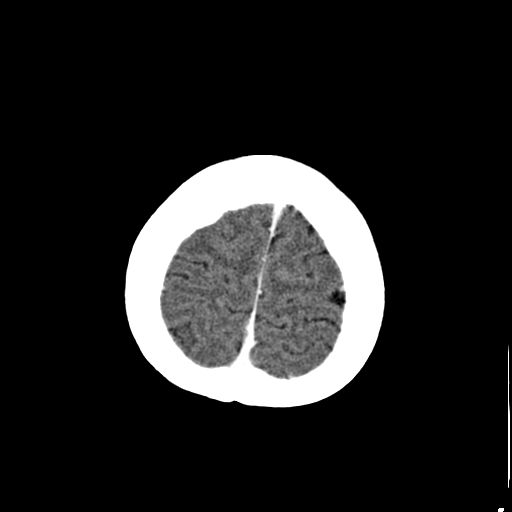
[im 31/36  brain]
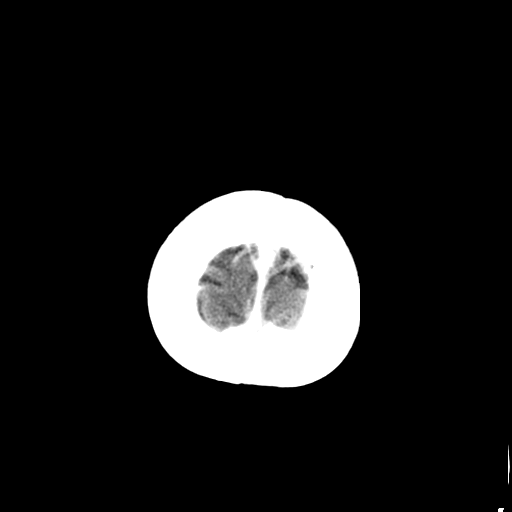
[im 31/36  bone]
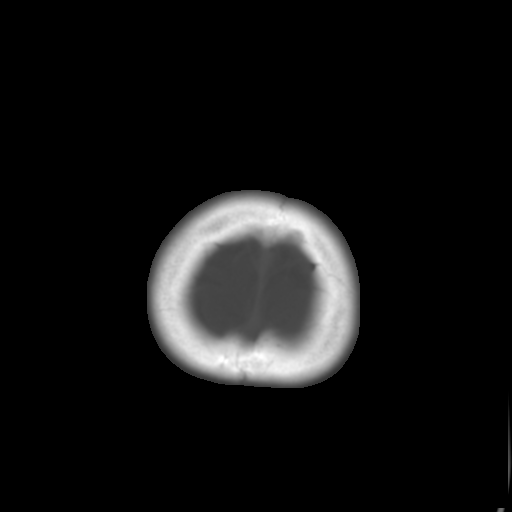
[im 33/36  brain]
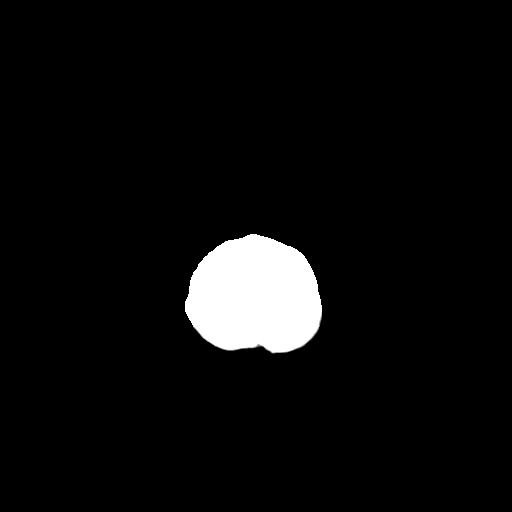

[14 of 30 positions shown; findings below may reference images not displayed]

FINDINGS: Visualized paranasal sinuses and mastoids are clear. Visualized
orbit soft tissues are within normal limits. Visualized scalp soft
tissues are within normal limits.

No acute or suspicious osseous lesion.

Cerebral volume remains normal. No ventriculomegaly. Gray-white
matter differentiation is within normal limits throughout the brain.
No evidence of cortically based acute infarction identified. No
acute intracranial hemorrhage identified. No abnormal enhancement
identified. Major intracranial vascular structures are enhancing.
IMPRESSION: Normal CT appearance of the brain.

No acute or metastatic intracranial abnormality.

## 2016-03-16 MED FILL — TAMOXIFEN 20 MG TABLET: 20 | 30 days supply | Qty: 30 | Fill #1

## 2016-03-24 ENCOUNTER — Ambulatory Visit: Payer: Self-pay | Attending: Nurse Practitioner | Admitting: Physical Therapy

## 2016-03-24 DIAGNOSIS — M25512 Pain in left shoulder: Secondary | ICD-10-CM | POA: Insufficient documentation

## 2016-03-24 DIAGNOSIS — I972 Postmastectomy lymphedema syndrome: Secondary | ICD-10-CM | POA: Insufficient documentation

## 2016-03-24 DIAGNOSIS — M25612 Stiffness of left shoulder, not elsewhere classified: Secondary | ICD-10-CM | POA: Insufficient documentation

## 2016-03-24 DIAGNOSIS — M6281 Muscle weakness (generalized): Secondary | ICD-10-CM | POA: Insufficient documentation

## 2016-03-24 NOTE — Therapy (Signed)
[place], [place], [address] Phone: [phone]   Fax:  [phone]  Physical Therapy Evaluation  Patient Details  Name: [name] MRN: [id number] Date of Birth: [birth year] Referring Provider: Drue Second  Encounter Date: 03/24/2016      PT End of Session - 03/24/16 1418    Visit Number 1   Number of Visits 25   Date for PT Re-Evaluation 05/19/16   PT Start Time 1325   PT Stop Time 1410   PT Time Calculation (min) 45 min   Activity Tolerance Patient tolerated treatment well   Behavior During Therapy [facility] for tasks assessed/performed      Past Medical History  Diagnosis Date  . History of breast cancer 2015    left  . Anemia     takes iron supplement  . Multiple blisters 11/2015    tongue - states from chemo  . Broken tooth 12/01/2015    left upper back  . History of chemotherapy     finished 07/2015    Past Surgical History  Procedure Laterality Date  . Cesarean section  09/09/2007  . Breast lumpectomy Left 07/10/2014    Procedure: LEFT BREAST LUMPECTOMY;  Surgeon: Merrie Roof, MD;  Location: [facility];  Service: General;  Laterality: Left;  . Portacath placement Right 09/06/2014    Procedure: INSERTION PORT-A-CATH;  Surgeon: Autumn Messing III, MD;  Location: [facility];  Service: General;  Laterality: Right;  . Mastectomy w/ sentinel node biopsy Left 09/06/2014    Procedure: LEFT MASTECTOMY WITH SENTINEL LYMPH NODE BIOPSY/NODE MAPPING;  Surgeon: Autumn Messing III, MD;  Location: [facility];  Service: General;  Laterality: Left;  . Port-a-cath removal N/A 12/08/2015    Procedure: REMOVAL PORT-A-CATH;  Surgeon: Autumn Messing III, MD;  Location: [facility];  Service: General;  Laterality: N/A;    There were no vitals filed for this visit.       Subjective Assessment - 03/24/16 1328    Subjective It is swollen and my fingers are swollen. The swelling has  gone down but it was a little more than what it is right now. I was using the glove but it ends in the middle of my fingers and it was too much pressure. Pt reports she was wearing her glove last week. She reports the swelling went down after she took her glove off. Pt did not have any problems with the sleeve and glove until recently    Pertinent History Patient underwent a left breast mastectomy in October 2015. She completed chemotherapy in March 2016 and completed radiation treatments in May 2016. She is currently undergoing tamoxifen oral therapy only.   Patient Stated Goals to lift my arm all the way up and without it hurting   Currently in Pain? Yes   Pain Score 3    Pain Location Axilla   Pain Orientation Left   Pain Descriptors / Indicators Aching;Throbbing   Pain Type Acute pain  could have been started when pt carried something heavy   Pain Onset 1 to 4 weeks ago   Pain Frequency Intermittent   Aggravating Factors  pt has not noticed   Pain Relieving Factors pain medication   Effect of Pain on Daily Activities she has probems putting on her bra, combing hair with left hand            [facility] PT Assessment - 03/24/16 0001    Assessment  Medical Diagnosis left sided breast cancer   Referring Provider Drue Second   Onset Date/Surgical Date 09/06/14   Hand Dominance Right   Prior Therapy Lymphedema therapy in Jan 2016   Precautions   Precautions Other (comment)  lymphedema   Restrictions   Weight Bearing Restrictions No   Balance Screen   Has the patient fallen in the past 6 months No   Has the patient had a decrease in activity level because of a fear of falling?  No   Is the patient reluctant to leave their home because of a fear of falling?  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   Leisure 30-45 min/day Mon-Friday   Cognition   Overall Cognitive Status Within Functional Limits for tasks assessed   Observation/Other Assessments   Other Surveys  --  LLIS: 57% impairment   AROM   Right Shoulder Flexion 170 Degrees   Right Shoulder ABduction 170 Degrees   Right Shoulder Internal Rotation 76 Degrees   Right Shoulder External Rotation 80 Degrees   Left Shoulder Flexion 137 Degrees   Left Shoulder ABduction 92 Degrees   Left Shoulder Internal Rotation 57 Degrees   Left Shoulder External Rotation 48 Degrees   Strength   Overall Strength --  right gross 5/5, left unable to assess due to pain           LYMPHEDEMA/ONCOLOGY QUESTIONNAIRE - 03/24/16 1345    Type   Cancer Type left breast cancer   Surgeries   Mastectomy Date 09/06/14   Number Lymph Nodes Removed 23  17 were positive   Date Lymphedema/Swelling Started   Date 11/15/14   Treatment   Active Chemotherapy Treatment No   Past Chemotherapy Treatment Yes   Date 02/14/15   Active Radiation Treatment No   Date 03/25/15   Body Site left chest and axilla   Past Radiation Treatment Yes   Date 03/25/15   Body Site left chest and axilla   Current Hormone Treatment Yes   Date --  pt unsure   Drug Name Tamoxifen   Past Hormone Therapy No   What other symptoms do you have   Are you Having Heaviness or Tightness Yes   Are you having Pain Yes   Are you having pitting edema Yes   Body Site left hand   Is it Hard or Difficult finding clothes that fit Yes   Do you have infections No   Is there Decreased scar mobility No  per pt report   Right Upper Extremity Lymphedema   15 cm Proximal to Olecranon Process 27.2 cm   Olecranon Process 22.5  cm   15 cm Proximal to Ulnar Styloid Process 23.4 cm   Just Proximal to Ulnar Styloid Process 14 cm   Across Hand at [employer] 18 cm   At [place] of 2nd Digit 6 cm   Left Upper Extremity Lymphedema   15 cm Proximal to Olecranon Process 26.1 cm   Olecranon Process  23.1 cm   15 cm Proximal to Ulnar Styloid Process 23.2 cm   Just Proximal to Ulnar Styloid Process 14.6 cm   Across Hand at [employer] 18.5 cm   At [place] of 2nd Digit 6 cm                           Short Term Clinic Goals - 03/24/16 1754    CC Short Term Goal  #1   Title Pt to demonstrate 150 degrees of left shoulder flexion to allow pt to wash hair with left hand   Baseline 137   Time 4   Period Weeks   Status New   CC Short Term Goal  #2   Title Pt to demonstrate 135 degrees of left shoulder abduction to allow pt to reach out to sides   Baseline 92   Time 4   Period [facility] Goals - 03/24/16 [place] Term Goal  #1   Title Pt to obtain appropriate compression sleeve and glove for management of LUE lymphedema   Baseline current glove increases hand edema   Time 8   Period Weeks   Status New   CC Long Term Goal  #2   Title Pt to demonstrate 170 degrees of left shoulder abduction for improved ADLs   Baseline 92   Time 8   Period Weeks   Status New   CC Long Term Goal  #3   Title Pt to demonstrate 170 degrees of left shoulder flexion to allow pt to reach overhead   Baseline 137   Time 8   Period Weeks   Status New   CC Long Term Goal  #4   Title Pt to be independent with a home exercise program for continued management   Time 8   Period Weeks   Status New   CC Long Term Goal  #5   Title Pt to demonstrate left shoulder active external rotation to at least 80 degrees for improved ADLs   Baseline 48   Time 8   Period Weeks   Status New   CC Long Term Goal  #6   Title Pt to demonstrate gross 5/5 strength throughout L UE and  shoulder to allow pt to return to PLOF   Time 8   Period Weeks   Status New   Additional Goals   Additional Goals Yes            Plan - 03/24/16 1420    Clinical Impression Statement Pt was seen in Jan 2016 for L upper extremity edema and it has been controlled with a sleeve and glove until approximately three weeks ago. Pt began having increased left hand swelling while wearing her compression glove and reports the swelling would go down once she removed the glove. She also had increased discomfort in her left hand with the swelling. She reports her left axilla feels tight and aches and she has limited range of motion which makes it more difficult to complete activites of daily living. Pt would benefit from skilled PT services to increase LUE ROM, strength and decrease LUE edema. Pt would benefit from a new compression sleeve and glove 20-30 with full fingers in the glove.    Rehab Potential Good   Clinical Impairments Affecting Rehab Potential hx of radiation  PT Frequency 3x / week   PT Duration 8 weeks   PT Treatment/Interventions Manual lymph drainage;Manual techniques;Therapeutic exercise;Passive range of motion;ADLs/Self Care Home Management;Compression bandaging;Scar mobilization   PT Next Visit Plan begin ROM on LUE, MLD to LUE - begin process to get new compression sleeve and glove   Consulted and Agree with Plan of Care Patient      Patient will benefit from skilled therapeutic intervention in order to improve the following deficits and impairments:  Decreased range of motion, Decreased strength, Increased fascial restricitons, Pain, Impaired UE functional use, Increased edema, Decreased scar mobility  Visit Diagnosis: Pain in left shoulder - Plan: PT plan of care cert/re-cert  Postmastectomy lymphedema - Plan: PT plan of care cert/re-cert  Stiffness of left shoulder, not elsewhere classified - Plan: PT plan of care cert/re-cert  Muscle weakness (generalized) - Plan: PT  plan of care cert/re-cert     Problem List Patient Active Problem List   Diagnosis Date Noted  . Genetic testing 09/22/2015  . Lymphedema 07/06/2015  . Arm pain 07/06/2015  . Heat intolerance 07/06/2015  . 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 Vaginal irritation 02/14/2015  . Breast cancer of lower-outer quadrant of left female breast ([place]) 07/26/2014    Alexia Freestone 03/24/2016, 6:00 PM  [place], [place], [address] Phone: [phone]   Fax:  [phone]  Name: Nel Havner Cook MRN: [id number] Date of Birth: 09/26/1986   Allyson Sabal, PT 03/24/2016 6:00 PM

## 2016-03-25 ENCOUNTER — Encounter: Payer: Self-pay | Admitting: Physical Therapy

## 2016-03-25 ENCOUNTER — Ambulatory Visit: Payer: Self-pay | Admitting: Physical Therapy

## 2016-03-25 DIAGNOSIS — M6281 Muscle weakness (generalized): Secondary | ICD-10-CM

## 2016-03-25 DIAGNOSIS — M25612 Stiffness of left shoulder, not elsewhere classified: Secondary | ICD-10-CM

## 2016-03-25 DIAGNOSIS — M25512 Pain in left shoulder: Secondary | ICD-10-CM

## 2016-03-25 DIAGNOSIS — I972 Postmastectomy lymphedema syndrome: Secondary | ICD-10-CM

## 2016-03-25 NOTE — Therapy (Signed)
[place], [place], [address] Phone: [phone]   Fax:  [phone]  Physical Therapy Treatment  Patient Details  Name: [name] MRN: [id number] Date of Birth: 23-Oct-1986 Referring Provider: Drue Second  Encounter Date: 03/25/2016      PT End of Session - 03/25/16 1659    Visit Number 2   Number of Visits 25   Date for PT Re-Evaluation 05/19/16   PT Start Time 1440   PT Stop Time 1519   PT Time Calculation (min) 39 min   Activity Tolerance Patient tolerated treatment well   Behavior During Therapy [facility] for tasks assessed/performed      Past Medical History  Diagnosis Date  . History of breast cancer 2015    left  . Anemia     takes iron supplement  . Multiple blisters 11/2015    tongue - states from chemo  . Broken tooth 12/01/2015    left upper back  . History of chemotherapy     finished 07/2015    Past Surgical History  Procedure Laterality Date  . Cesarean section  09/09/2007  . Breast lumpectomy Left 07/10/2014    Procedure: LEFT BREAST LUMPECTOMY;  Surgeon: Merrie Roof, MD;  Location: [place];  Service: General;  Laterality: Left;  . Portacath placement Right 09/06/2014    Procedure: INSERTION PORT-A-CATH;  Surgeon: Autumn Messing III, MD;  Location: [place];  Service: General;  Laterality: Right;  . Mastectomy w/ sentinel node biopsy Left 09/06/2014    Procedure: LEFT MASTECTOMY WITH SENTINEL LYMPH NODE BIOPSY/NODE MAPPING;  Surgeon: Autumn Messing III, MD;  Location: [place];  Service: General;  Laterality: Left;  . Port-a-cath removal N/A 12/08/2015    Procedure: REMOVAL PORT-A-CATH;  Surgeon: Autumn Messing III, MD;  Location: [place];  Service: General;  Laterality: N/A;    There were no vitals filed for this visit.      Subjective Assessment - 03/25/16 1443    Subjective I am doing the same as yesterday.    Pertinent History Patient  underwent a left breast mastectomy in October 2015. She completed chemotherapy in March 2016 and completed radiation treatments in May 2016. She is currently undergoing tamoxifen oral therapy only.   Patient Stated Goals to lift my arm all the way up and without it hurting   Currently in Pain? Yes   Pain Score 3    Pain Location Axilla   Pain Orientation Left   Pain Descriptors / Indicators Throbbing;Aching   Pain Type Acute pain   Pain Onset 1 to 4 weeks ago               LYMPHEDEMA/ONCOLOGY QUESTIONNAIRE - 03/24/16 1345    Type   Cancer Type left breast cancer   Surgeries   Mastectomy Date 09/06/14   Number Lymph Nodes Removed 23  17 were positive   Date Lymphedema/Swelling Started   Date 11/15/14   Treatment   Active Chemotherapy Treatment No   Past Chemotherapy Treatment Yes   Date 02/14/15   Active Radiation Treatment No   Date 03/25/15   Body Site left chest and axilla   Past Radiation Treatment Yes   Date 03/25/15   Body Site left chest and axilla   Current Hormone Treatment Yes   Date --  pt unsure   Drug Name Tamoxifen   Past Hormone Therapy No   What other symptoms do you have  Are you Having Heaviness or Tightness Yes   Are you having Pain Yes   Are you having pitting edema Yes   Body Site left hand   Is it Hard or Difficult finding clothes that fit Yes   Do you have infections No   Is there Decreased scar mobility No  per pt report   Right Upper Extremity Lymphedema   15 cm Proximal to Olecranon Process 27.2 cm   Olecranon Process 22.5 cm   15 cm Proximal to Ulnar Styloid Process 23.4 cm   Just Proximal to Ulnar Styloid Process 14 cm   Across Hand at [employer] 18 cm   At [place] of 2nd Digit 6 cm   Left Upper Extremity Lymphedema   15 cm Proximal to Olecranon Process 26.1 cm   Olecranon Process 23.1 cm   15 cm Proximal to Ulnar Styloid Process 23.2 cm   Just Proximal to Ulnar Styloid Process 14.6 cm   Across Hand at [employer]  18.5 cm   At [place] of 2nd Digit 6 cm                  [facility] Adult PT Treatment/Exercise - 03/25/16 0001    Shoulder Exercises: Supine   Flexion AAROM;Both;10 reps  with dowel 5-10 sec holds   ABduction AAROM;Left;10 reps  with dowel 5-10 sec holds   Shoulder Exercises: Standing   Other Standing Exercises finger ladder x 5 in abduction and flexion on LUE   Shoulder Exercises: Pulleys   ABduction 2 minutes   Manual Therapy   Manual Therapy Passive ROM   Passive ROM in direction of flexion, abduction, ER and IR with pt limited by pain/tightness across chest and in axilla                PT Education - 03/25/16 1659    Education provided Yes   Education Details home exercises with dowel rod   Person(s) Educated Patient   Methods Explanation;Demonstration   Comprehension Verbalized understanding;Returned demonstration           Short Term Clinic Goals - 03/24/16 1754    CC Short Term Goal  #1   Title Pt to demonstrate 150 degrees of left shoulder flexion to allow pt to wash hair with left hand   Baseline 137   Time 4   Period Weeks   Status New   CC Short Term Goal  #2   Title Pt to demonstrate 135 degrees of left shoulder abduction to allow pt to reach out to sides   Baseline 92   Time 4   Period Weeks   Status [place] [facility] Goals - 03/24/16 1755    CC Long Term Goal  #1   Title Pt to obtain appropriate compression sleeve and glove for management of LUE lymphedema   Baseline current glove increases hand edema   Time 8   Period Weeks   Status New   CC Long Term Goal  #2   Title Pt to demonstrate 170 degrees of left shoulder abduction for improved ADLs   Baseline 92   Time 8   Period Weeks   Status New   CC Long Term Goal  #3   Title Pt to demonstrate 170 degrees of left shoulder flexion to allow pt to reach overhead   Baseline 137   Time 8   Period Weeks   Status New  CC Long Term Goal  #4   Title Pt to be  independent with a home exercise program for continued management   Time 8   Period Weeks   Status New   CC Long Term Goal  #5   Title Pt to demonstrate left shoulder active external rotation to at least 80 degrees for improved ADLs   Baseline 48   Time 8   Period Weeks   Status New   CC Long Term Goal  #6   Title Pt to demonstrate gross 5/5 strength throughout L UE and shoulder to allow pt to return to PLOF   Time 8   Period Weeks   Status New   Additional Goals   Additional Goals Yes            Plan - 03/25/16 1700    Clinical Impression Statement Focus today was on increasing L upper extremity range of motion. Pt instructed in exercises for AAROM for LUE. PROM performed to LUE with pt feeling tightness across chest and in axilla. Attempted to set up appointment for pt to be measured for compression sleeve and glove locally but fitter on vacation until Monday. Pt reported increased fatigue in LUE following exercises but no increase in pain.    Rehab Potential Good   Clinical Impairments Affecting Rehab Potential hx of radiation   PT Frequency 3x / week   PT Duration 8 weeks   PT Treatment/Interventions Manual lymph drainage;Manual techniques;Therapeutic exercise;Passive range of motion;ADLs/Self Care Home Management;Compression bandaging;Scar mobilization   PT Next Visit Plan AAROM for LUE, ROM, PROM LUE, MLD if swelling is problematic, myofascial left chest   Consulted and Agree with Plan of Care Patient      Patient will benefit from skilled therapeutic intervention in order to improve the following deficits and impairments:  Decreased range of motion, Decreased strength, Increased fascial restricitons, Pain, Impaired UE functional use, Increased edema, Decreased scar mobility  Visit Diagnosis: Pain in left shoulder  Stiffness of left shoulder, not elsewhere classified  Muscle weakness (generalized)  Postmastectomy lymphedema     Problem List Patient Active  Problem List   Diagnosis Date Noted  . Genetic testing 09/22/2015  . Lymphedema 07/06/2015  . Arm pain 07/06/2015  . Heat intolerance 07/06/2015  . 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 Vaginal irritation 02/14/2015  . Breast cancer of lower-outer quadrant of left female breast ([place]) 07/26/2014    Alexia Freestone 03/25/2016, 5:05 PM  [place], [place], [address] Phone: [phone]   Fax:  [phone]  Name: Keirah Jordon Cook MRN: [id number] Date of Birth: June 02, 1986    Allyson Sabal, PT 03/25/2016 5:05 PM

## 2016-03-25 NOTE — Patient Instructions (Signed)
Shoulder: Flexion (Supine)    Con las manos separadas el ancho de los hombros, baje lentamente el palo hacia el suelo por detrs de la cabeza. No permita que se doblen los codos. Mantenga la espalda apoyada. Mantenga _5-10___ segundos. Repita __10__ veces. Haga _2___ sesiones por da. CUIDADO: Elongue lenta y suavemente.  Copyright  V[employer]. All rights reserved.  Shoulder: Abduction (Supine)    Apoye el brazo derecho en el suelo. Sostenga un palo en la palma. Mueva lentamente el brazo hacia arriba a un costado de la cabeza empujando el palo con la [place]. No permita que se doble el codo. Mantenga _5-10___ segundos. Repita __10__ veces. Haga _2___ sesiones por da. CUIDADO: Elongue lenta y suavemente.  Copyright  VHI. All rights reserved.

## 2016-03-25 NOTE — Therapy (Deleted)
[name], [place], [address] Phone: [phone]   Fax:  [phone]  Physical Therapy Treatment  Patient Details  Name: [name] MRN: [id number] Date of Birth: [birth year] Referring Provider: Drue Second  Encounter Date: 03/25/2016      PT End of Session - 03/25/16 1659    Visit Number 2   Number of Visits 25   Date for PT Re-Evaluation 05/19/16   PT Start Time 1440   PT Stop Time 1519   PT Time Calculation (min) 39 min   Activity Tolerance Patient tolerated treatment well   Behavior During Therapy [facility] for tasks assessed/performed      Past Medical History  Diagnosis Date  . History of breast cancer 2015    left  . Anemia     takes iron supplement  . Multiple blisters 11/2015    tongue - states from chemo  . Broken tooth 12/01/2015    left upper back  . History of chemotherapy     finished 07/2015    Past Surgical History  Procedure Laterality Date  . Cesarean section  09/09/2007  . Breast lumpectomy Left 07/10/2014    Procedure: LEFT BREAST LUMPECTOMY;  Surgeon: Merrie Roof, MD;  Location: [place];  Service: General;  Laterality: Left;  . Portacath placement Right 09/06/2014    Procedure: INSERTION PORT-A-CATH;  Surgeon: Autumn Messing III, MD;  Location: [place];  Service: General;  Laterality: Right;  . Mastectomy w/ sentinel node biopsy Left 09/06/2014    Procedure: LEFT MASTECTOMY WITH SENTINEL LYMPH NODE BIOPSY/NODE MAPPING;  Surgeon: Autumn Messing III, MD;  Location: [place];  Service: General;  Laterality: Left;  . Port-a-cath removal N/A 12/08/2015    Procedure: REMOVAL PORT-A-CATH;  Surgeon: Autumn Messing III, MD;  Location: [place];  Service: General;  Laterality: N/A;    There were no vitals filed for this visit.      Subjective Assessment - 03/25/16 1443    Subjective I am doing the same as yesterday.    Pertinent History Patient  underwent a left breast mastectomy in October 2015. She completed chemotherapy in March 2016 and completed radiation treatments in May 2016. She is currently undergoing tamoxifen oral therapy only.   Patient Stated Goals to lift my arm all the way up and without it hurting   Currently in Pain? Yes   Pain Score 3    Pain Location Axilla   Pain Orientation Left   Pain Descriptors / Indicators Throbbing;Aching   Pain Type Acute pain   Pain Onset 1 to 4 weeks ago               LYMPHEDEMA/ONCOLOGY QUESTIONNAIRE - 03/24/16 1345    Type   Cancer Type left breast cancer   Surgeries   Mastectomy Date 09/06/14   Number Lymph Nodes Removed 23  17 were positive   Date Lymphedema/Swelling Started   Date 11/15/14   Treatment   Active Chemotherapy Treatment No   Past Chemotherapy Treatment Yes   Date 02/14/15   Active Radiation Treatment No   Date 03/25/15   Body Site left chest and axilla   Past Radiation Treatment Yes   Date 03/25/15   Body Site left chest and axilla   Current Hormone Treatment Yes   Date --  pt unsure   Drug Name Tamoxifen   Past Hormone Therapy No   What other symptoms do you have  Are you Having Heaviness or Tightness Yes   Are you having Pain Yes   Are you having pitting edema Yes   Body Site left hand   Is it Hard or Difficult finding clothes that fit Yes   Do you have infections No   Is there Decreased scar mobility No  per pt report   Right Upper Extremity Lymphedema   15 cm Proximal to Olecranon Process 27.2 cm   Olecranon Process 22.5 cm   15 cm Proximal to Ulnar Styloid Process 23.4 cm   Just Proximal to Ulnar Styloid Process 14 cm   Across Hand at [employer] 18 cm   At [place] of 2nd Digit 6 cm   Left Upper Extremity Lymphedema   15 cm Proximal to Olecranon Process 26.1 cm   Olecranon Process 23.1 cm   15 cm Proximal to Ulnar Styloid Process 23.2 cm   Just Proximal to Ulnar Styloid Process 14.6 cm   Across Hand at [employer]  18.5 cm   At [place] of 2nd Digit 6 cm                  [facility] Adult PT Treatment/Exercise - 03/25/16 0001    Shoulder Exercises: Supine   Flexion AAROM;Both;10 reps  with dowel 5-10 sec holds   ABduction AAROM;Left;10 reps  with dowel 5-10 sec holds   Shoulder Exercises: Standing   Other Standing Exercises finger ladder x 5 in abduction and flexion on LUE   Shoulder Exercises: Pulleys   ABduction 2 minutes                PT Education - 03/25/16 1659    Education provided Yes   Education Details home exercises with dowel rod   Person(s) Educated Patient   Methods Explanation;Demonstration   Comprehension Verbalized understanding;Returned demonstration           Short Term Clinic Goals - 03/24/16 1754    CC Short Term Goal  #1   Title Pt to demonstrate 150 degrees of left shoulder flexion to allow pt to wash hair with left hand   Baseline 137   Time 4   Period Weeks   Status New   CC Short Term Goal  #2   Title Pt to demonstrate 135 degrees of left shoulder abduction to allow pt to reach out to sides   Baseline 92   Time 4   Period Weeks   Status [facility] Goals - 03/24/16 1755    CC Long Term Goal  #1   Title Pt to obtain appropriate compression sleeve and glove for management of LUE lymphedema   Baseline current glove increases hand edema   Time 8   Period Weeks   Status New   CC Long Term Goal  #2   Title Pt to demonstrate 170 degrees of left shoulder abduction for improved ADLs   Baseline 92   Time 8   Period Weeks   Status New   CC Long Term Goal  #3   Title Pt to demonstrate 170 degrees of left shoulder flexion to allow pt to reach overhead   Baseline 137   Time 8   Period Weeks   Status New   CC Long Term Goal  #4   Title Pt to be independent with a home exercise program for continued management   Time 8   Period Weeks  Status New   CC Long Term Goal  #5   Title Pt to demonstrate left shoulder  active external rotation to at least 80 degrees for improved ADLs   Baseline 48   Time 8   Period Weeks   Status New   CC Long Term Goal  #6   Title Pt to demonstrate gross 5/5 strength throughout L UE and shoulder to allow pt to return to PLOF   Time 8   Period Weeks   Status New   Additional Goals   Additional Goals Yes            Plan - 03/25/16 1700    Clinical Impression Statement Focus today was on increasing L upper extremity range of motion. Pt instructed in exercises for AAROM for LUE. PROM performed to LUE with pt feeling tightness across chest and in axilla. Attempted to set up appointment for pt to be measured for compression sleeve and glove locally but fitter on vacation until Monday. Pt reported increased fatigue in LUE following exercises but no increase in pain.    Rehab Potential Good   Clinical Impairments Affecting Rehab Potential hx of radiation   PT Frequency 3x / week   PT Duration 8 weeks   PT Treatment/Interventions Manual lymph drainage;Manual techniques;Therapeutic exercise;Passive range of motion;ADLs/Self Care Home Management;Compression bandaging;Scar mobilization   PT Next Visit Plan AAROM for LUE, ROM, PROM LUE, MLD if swelling is problematic, myofascial left chest   Consulted and Agree with Plan of Care Patient      Patient will benefit from skilled therapeutic intervention in order to improve the following deficits and impairments:  Decreased range of motion, Decreased strength, Increased fascial restricitons, Pain, Impaired UE functional use, Increased edema, Decreased scar mobility  Visit Diagnosis: Pain in left shoulder  Stiffness of left shoulder, not elsewhere classified  Muscle weakness (generalized)  Postmastectomy lymphedema     Problem List Patient Active Problem List   Diagnosis Date Noted  . Genetic testing 09/22/2015  . Lymphedema 07/06/2015  . Arm pain 07/06/2015  . Heat intolerance 07/06/2015  . Radiation dermatitis  05/01/2015  . Vaginal irritation 02/14/2015  . Breast cancer of lower-outer quadrant of left female breast ([place]) 07/26/2014    Alexia Freestone 03/25/2016, 5:03 PM  [place], [place], [address] Phone: [phone]   Fax:  [phone]  Name: Jaiona Kentner Cook MRN: [id number] Date of Birth: [birth year]    Allyson Sabal, PT 03/25/2016 5:03 PM

## 2016-03-30 ENCOUNTER — Ambulatory Visit: Payer: Self-pay

## 2016-03-30 DIAGNOSIS — M6281 Muscle weakness (generalized): Secondary | ICD-10-CM

## 2016-03-30 DIAGNOSIS — I972 Postmastectomy lymphedema syndrome: Secondary | ICD-10-CM

## 2016-03-30 DIAGNOSIS — M25612 Stiffness of left shoulder, not elsewhere classified: Secondary | ICD-10-CM

## 2016-03-30 DIAGNOSIS — M25512 Pain in left shoulder: Secondary | ICD-10-CM

## 2016-03-30 NOTE — Therapy (Signed)
[place], [place], [address] Phone: [phone]   Fax:  [phone]  Physical Therapy Treatment  Patient Details  Name: [name] MRN: [id number] Date of Birth: 1986-05-01 Referring Provider: Drue Second  Encounter Date: 03/30/2016      PT End of Session - 03/30/16 1019    Visit Number 3   Number of Visits 25   Date for PT Re-Evaluation 05/19/16   PT Start Time 0937   PT Stop Time 1016   PT Time Calculation (min) 39 min   Activity Tolerance Patient tolerated treatment well   Behavior During Therapy [facility] for tasks assessed/performed      Past Medical History  Diagnosis Date  . History of breast cancer 2015    left  . Anemia     takes iron supplement  . Multiple blisters 11/2015    tongue - states from chemo  . Broken tooth 12/01/2015    left upper back  . History of chemotherapy     finished 07/2015    Past Surgical History  Procedure Laterality Date  . Cesarean section  09/09/2007  . Breast lumpectomy Left 07/10/2014    Procedure: LEFT BREAST LUMPECTOMY;  Surgeon: Merrie Roof, MD;  Location: [place];  Service: General;  Laterality: Left;  . Portacath placement Right 09/06/2014    Procedure: INSERTION PORT-A-CATH;  Surgeon: Autumn Messing III, MD;  Location: [place];  Service: General;  Laterality: Right;  . Mastectomy w/ sentinel node biopsy Left 09/06/2014    Procedure: LEFT MASTECTOMY WITH SENTINEL LYMPH NODE BIOPSY/NODE MAPPING;  Surgeon: Autumn Messing III, MD;  Location: [place];  Service: General;  Laterality: Left;  . Port-a-cath removal N/A 12/08/2015    Procedure: REMOVAL PORT-A-CATH;  Surgeon: Autumn Messing III, MD;  Location: [place];  Service: General;  Laterality: N/A;    There were no vitals filed for this visit.      Subjective Assessment - 03/30/16 0940    Subjective Can lift my arm higher at home.    Pertinent History Patient  underwent a left breast mastectomy in October 2015. She completed chemotherapy in March 2016 and completed radiation treatments in May 2016. She is currently undergoing tamoxifen oral therapy only.   Patient Stated Goals to lift my arm all the way up and without it hurting   Currently in Pain? No/denies            [facility] PT Assessment - 03/30/16 0001    AROM   Left Shoulder Flexion 143 Degrees   Left Shoulder ABduction 126 Degrees   Left Shoulder Internal Rotation 55 Degrees   Left Shoulder External Rotation 74 Degrees           LYMPHEDEMA/ONCOLOGY QUESTIONNAIRE - 03/30/16 0951    Left Upper Extremity Lymphedema   15 cm Proximal to Olecranon Process 26.8 cm   Olecranon Process 22.8 cm   15 cm Proximal to Ulnar Styloid Process 22.8 cm   Just Proximal to Ulnar Styloid Process 14.1 cm   Across Hand at [employer] 17.9 cm   At [place] of 2nd Digit 6.2 cm   Other Pt reports Lt lateral trunk near axilla feels bigger today but unable to rate this.                  [facility] Adult PT Treatment/Exercise - 03/30/16 0001    Shoulder Exercises: Standing   Other Standing Exercises Finger Ladder for  Lt UE abduction x 5.    Shoulder Exercises: Pulleys   ABduction 2 minutes   Shoulder Exercises: Therapy Ball   Flexion 10 reps  With forward lean at top of stretch   Manual Therapy   Manual Therapy Edema management;Myofascial release;Passive ROM;Manual Lymphatic Drainage (MLD)   Edema Management Issued long rectangle piece of 1/4" gray foam for pt to wear at bottom of bra where tightest and is seeming to decrease lymphatic flow   Myofascial Release To Lt chest wall during abduction to help decrease pain of stretch   Manual Lymphatic Drainage (MLD) Short neck, 5 diaphragmatic breaths, Lt inguinal nodes and Lt axillo-inguinal anastomosis focusing on area of swelling near axilla.   Passive ROM Lt shoulder in supine into flexion, abduction and er                   Short  Term Clinic Goals - 03/30/16 1027    CC Short Term Goal  #1   Title Pt to demonstrate 150 degrees of left shoulder flexion to allow pt to wash hair with left hand   Baseline 137, 143 degrees 03/30/16   Status On-going   CC Short Term Goal  #2   Title Pt to demonstrate 135 degrees of left shoulder abduction to allow pt to reach out to sides   Baseline 92, 126 degrees 03/30/16   Status On-going             [place] [facility] Goals - 03/24/16 1755    CC Long Term Goal  #1   Title Pt to obtain appropriate compression sleeve and glove for management of LUE lymphedema   Baseline current glove increases hand edema   Time 8   Period Weeks   Status New   CC Long Term Goal  #2   Title Pt to demonstrate 170 degrees of left shoulder abduction for improved ADLs   Baseline 92   Time 8   Period Weeks   Status New   CC Long Term Goal  #3   Title Pt to demonstrate 170 degrees of left shoulder flexion to allow pt to reach overhead   Baseline 137   Time 8   Period Weeks   Status New   CC Long Term Goal  #4   Title Pt to be independent with a home exercise program for continued management   Time 8   Period Weeks   Status New   CC Long Term Goal  #5   Title Pt to demonstrate left shoulder active external rotation to at least 80 degrees for improved ADLs   Baseline 48   Time 8   Period Weeks   Status New   CC Long Term Goal  #6   Title Pt to demonstrate gross 5/5 strength throughout L UE and shoulder to allow pt to return to PLOF   Time 8   Period Weeks   Status New   Additional Goals   Additional Goals Yes            Plan - 03/30/16 1020    Clinical Impression Statement Pt seemed to tolerate exercises much better today with verl little c/o pain throughout AA/ROM and P/ROM. Her A/ROM was increased maing good progress towards her STGs. Pt also able to relax very well with P/ROM allowing for increased end ROM stretching. Pt tolerated abduction better with myofascial release done  simultaneously. Her circumference measurements were overall reduced today except upper arm was slightly elevated. Pt left  wearing the compression foam in bra to see if this will help decrease the accumulation of fluid here.    Rehab Potential Good   Clinical Impairments Affecting Rehab Potential hx of radiation   PT Frequency 3x / week   PT Duration 8 weeks   PT Treatment/Interventions Manual lymph drainage;Manual techniques;Therapeutic exercise;Passive range of motion;ADLs/Self Care Home Management;Compression bandaging;Scar mobilization   PT Next Visit Plan AAROM for LUE, ROM, PROM LUE, MLD if swelling is problematic, myofascial left chest. Assess gray foam wear issued today.       Patient will benefit from skilled therapeutic intervention in order to improve the following deficits and impairments:  Decreased range of motion, Decreased strength, Increased fascial restricitons, Pain, Impaired UE functional use, Increased edema, Decreased scar mobility  Visit Diagnosis: Pain in left shoulder  Stiffness of left shoulder, not elsewhere classified  Muscle weakness (generalized)  Postmastectomy lymphedema     Problem List Patient Active Problem List   Diagnosis Date Noted  . Genetic testing 09/22/2015  . Lymphedema 07/06/2015  . Arm pain 07/06/2015  . Heat intolerance 07/06/2015  . 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 Vaginal irritation 02/14/2015  . Breast cancer of lower-outer quadrant of left female breast ([place]) 07/26/2014    Otelia Limes, PTA 03/30/2016, 10:29 AM  [place], [place], [address] Phone: [phone]   Fax:  [phone]  Name: [name] MRN: [id number] Date of Birth: Aug 04, 1986

## 2016-03-31 ENCOUNTER — Ambulatory Visit: Payer: Self-pay | Admitting: Physical Therapy

## 2016-04-02 ENCOUNTER — Encounter: Payer: Self-pay | Admitting: Physical Therapy

## 2016-04-02 ENCOUNTER — Ambulatory Visit: Payer: Self-pay | Admitting: Physical Therapy

## 2016-04-02 DIAGNOSIS — I972 Postmastectomy lymphedema syndrome: Secondary | ICD-10-CM

## 2016-04-02 DIAGNOSIS — M6281 Muscle weakness (generalized): Secondary | ICD-10-CM

## 2016-04-02 DIAGNOSIS — M25512 Pain in left shoulder: Secondary | ICD-10-CM

## 2016-04-02 DIAGNOSIS — M25612 Stiffness of left shoulder, not elsewhere classified: Secondary | ICD-10-CM

## 2016-04-02 NOTE — Therapy (Signed)
[place], [place], [address] Phone: [phone]   Fax:  [phone]  Physical Therapy Treatment  Patient Details  Name: [name] MRN: [id number] Date of Birth: 1986/02/20 Referring Provider: Drue Second  Encounter Date: 04/02/2016      PT End of Session - 04/02/16 0849    Visit Number 4   Number of Visits 25   Date for PT Re-Evaluation 05/19/16   PT Start Time 0807   PT Stop Time 0846   PT Time Calculation (min) 39 min   Activity Tolerance Patient tolerated treatment well   Behavior During Therapy [facility] for tasks assessed/performed      Past Medical History  Diagnosis Date  . History of breast cancer 2015    left  . Anemia     takes iron supplement  . Multiple blisters 11/2015    tongue - states from chemo  . Broken tooth 12/01/2015    left upper back  . History of chemotherapy     finished 07/2015    Past Surgical History  Procedure Laterality Date  . Cesarean section  09/09/2007  . Breast lumpectomy Left 07/10/2014    Procedure: LEFT BREAST LUMPECTOMY;  Surgeon: Merrie Roof, MD;  Location: [place];  Service: General;  Laterality: Left;  . Portacath placement Right 09/06/2014    Procedure: INSERTION PORT-A-CATH;  Surgeon: Autumn Messing III, MD;  Location: [place];  Service: General;  Laterality: Right;  . Mastectomy w/ sentinel node biopsy Left 09/06/2014    Procedure: LEFT MASTECTOMY WITH SENTINEL LYMPH NODE BIOPSY/NODE MAPPING;  Surgeon: Autumn Messing III, MD;  Location: [place];  Service: General;  Laterality: Left;  . Port-a-cath removal N/A 12/08/2015    Procedure: REMOVAL PORT-A-CATH;  Surgeon: Autumn Messing III, MD;  Location: [place];  Service: General;  Laterality: N/A;    There were no vitals filed for this visit.      Subjective Assessment - 04/02/16 0808    Subjective I feel pretty good. I think the massage helped the sweling.The  grey foam in her bra helped a little.    Patient is accompained by: Interpreter   Pertinent History Patient underwent a left breast mastectomy in October 2015. She completed chemotherapy in March 2016 and completed radiation treatments in May 2016. She is currently undergoing tamoxifen oral therapy only.   Patient Stated Goals to lift my arm all the way up and without it hurting   Currently in Pain? No/denies   Pain Score 0-No pain                         [facility] Adult PT Treatment/Exercise - 04/02/16 0001    Shoulder Exercises: Standing   Other Standing Exercises Finger Ladder for Lt UE abduction x 5. flexion x 5   Shoulder Exercises: Pulleys   ABduction 2 minutes   Shoulder Exercises: Therapy Ball   Flexion 10 reps  With forward lean at top of stretch   ABduction 10 reps  with stretch at end   Manual Therapy   Manual Therapy Edema management;Myofascial release;Passive ROM;Manual Lymphatic Drainage (MLD)   Myofascial Release To Lt chest wall during abduction to help decrease pain of stretch   Manual Lymphatic Drainage (MLD) Short neck, 5 diaphragmatic breaths, Lt inguinal nodes and Lt axillo-inguinal anastomosis focusing on area of swelling near axilla.   Passive ROM Lt shoulder in supine into flexion, abduction and  er                   [facility] Term Clinic Goals - 03/30/16 1027    CC Short Term Goal  #1   Title Pt to demonstrate 150 degrees of left shoulder flexion to allow pt to wash hair with left hand   Baseline 137, 143 degrees 03/30/16   Status On-going   CC Short Term Goal  #2   Title Pt to demonstrate 135 degrees of left shoulder abduction to allow pt to reach out to sides   Baseline 92, 126 degrees 03/30/16   Status On-going             [facility] Goals - 03/24/16 1755    CC Long Term Goal  #1   Title Pt to obtain appropriate compression sleeve and glove for management of LUE lymphedema   Baseline current glove increases hand edema    Time 8   Period Weeks   Status New   CC Long Term Goal  #2   Title Pt to demonstrate 170 degrees of left shoulder abduction for improved ADLs   Baseline 92   Time 8   Period Weeks   Status New   CC Long Term Goal  #3   Title Pt to demonstrate 170 degrees of left shoulder flexion to allow pt to reach overhead   Baseline 137   Time 8   Period Weeks   Status New   CC Long Term Goal  #4   Title Pt to be independent with a home exercise program for continued management   Time 8   Period Weeks   Status New   CC Long Term Goal  #5   Title Pt to demonstrate left shoulder active external rotation to at least 80 degrees for improved ADLs   Baseline 48   Time 8   Period Weeks   Status New   CC Long Term Goal  #6   Title Pt to demonstrate gross 5/5 strength throughout L UE and shoulder to allow pt to return to PLOF   Time 8   Period Weeks   Status New   Additional Goals   Additional Goals Yes            Plan - 04/02/16 0849    Clinical Impression Statement Myofascial release and soft tissue mobilization performed to left pectoralis muscle due to increased tightness and pulling during left shoulder abduction. Pt is still limited in PROM in direction of flexion, abduction and ER. Pt feels the MLD is helping control her edema under left axilla. Pt being measured for compression sleeve, glove and compression tank top following this appointment.    Rehab Potential Good   Clinical Impairments Affecting Rehab Potential hx of radiation   PT Frequency 3x / week   PT Duration 8 weeks   PT Treatment/Interventions Manual lymph drainage;Manual techniques;Therapeutic exercise;Passive range of motion;ADLs/Self Care Home Management;Compression bandaging;Scar mobilization   PT Next Visit Plan AAROM for LUE, ROM, PROM LUE, MLD if swelling is problematic, myofascial left chest   Consulted and Agree with Plan of Care Patient      Patient will benefit from skilled therapeutic intervention in  order to improve the following deficits and impairments:  Decreased range of motion, Decreased strength, Increased fascial restricitons, Pain, Impaired UE functional use, Increased edema, Decreased scar mobility  Visit Diagnosis: Pain in left shoulder  Stiffness of left shoulder, not elsewhere classified  Muscle weakness (generalized)  Postmastectomy  lymphedema     Problem List Patient Active Problem List   Diagnosis Date Noted  . Genetic testing 09/22/2015  . Lymphedema 07/06/2015  . Arm pain 07/06/2015  . Heat intolerance 07/06/2015  . 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 Vaginal irritation 02/14/2015  . Breast cancer of lower-outer quadrant of left female breast ([place]) 07/26/2014    Alexia Freestone 04/02/2016, 9:48 AM  [place], [place], [address] Phone: [phone]   Fax:  [phone]  Name: [name] MRN: [id number] Date of Birth: 11/02/1986    Allyson Sabal, PT 04/02/2016 9:48 AM

## 2016-04-05 ENCOUNTER — Ambulatory Visit: Payer: Self-pay | Admitting: Physical Therapy

## 2016-04-05 ENCOUNTER — Encounter: Payer: Self-pay | Admitting: Physical Therapy

## 2016-04-05 DIAGNOSIS — M25512 Pain in left shoulder: Secondary | ICD-10-CM

## 2016-04-05 DIAGNOSIS — I972 Postmastectomy lymphedema syndrome: Secondary | ICD-10-CM

## 2016-04-05 DIAGNOSIS — M6281 Muscle weakness (generalized): Secondary | ICD-10-CM

## 2016-04-05 DIAGNOSIS — M25612 Stiffness of left shoulder, not elsewhere classified: Secondary | ICD-10-CM

## 2016-04-05 NOTE — Therapy (Signed)
[place], [place], [address] Phone: [phone]   Fax:  [phone]  Physical Therapy Treatment  Patient Details  Name: [name] MRN: [id number] Date of Birth: 05-02-1986 Referring Provider: Drue Second  Encounter Date: 04/05/2016      PT End of Session - 04/05/16 1500    Visit Number 5   Number of Visits 25   Date for PT Re-Evaluation 05/19/16   PT Start Time 1302   PT Stop Time 1348   PT Time Calculation (min) 46 min   Activity Tolerance Patient tolerated treatment well   Behavior During Therapy [facility] for tasks assessed/performed      Past Medical History  Diagnosis Date  . History of breast cancer 2015    left  . Anemia     takes iron supplement  . Multiple blisters 11/2015    tongue - states from chemo  . Broken tooth 12/01/2015    left upper back  . History of chemotherapy     finished 07/2015    Past Surgical History  Procedure Laterality Date  . Cesarean section  09/09/2007  . Breast lumpectomy Left 07/10/2014    Procedure: LEFT BREAST LUMPECTOMY;  Surgeon: Merrie Roof, MD;  Location: [place];  Service: General;  Laterality: Left;  . Portacath placement Right 09/06/2014    Procedure: INSERTION PORT-A-CATH;  Surgeon: Autumn Messing III, MD;  Location: [place];  Service: General;  Laterality: Right;  . Mastectomy w/ sentinel node biopsy Left 09/06/2014    Procedure: LEFT MASTECTOMY WITH SENTINEL LYMPH NODE BIOPSY/NODE MAPPING;  Surgeon: Autumn Messing III, MD;  Location: [place];  Service: General;  Laterality: Left;  . Port-a-cath removal N/A 12/08/2015    Procedure: REMOVAL PORT-A-CATH;  Surgeon: Autumn Messing III, MD;  Location: [place];  Service: General;  Laterality: N/A;    There were no vitals filed for this visit.      Subjective Assessment - 04/05/16 1304    Subjective Pt states she has a little bit of pain in her left axilla.    Pertinent History Patient underwent a left breast mastectomy in October 2015. She completed chemotherapy in March 2016 and completed radiation treatments in May 2016. She is currently undergoing tamoxifen oral therapy only.   Patient Stated Goals to lift my arm all the way up and without it hurting   Currently in Pain? Yes   Pain Score 3    Pain Location Axilla   Pain Orientation Left   Pain Descriptors / Indicators Aching            [facility] PT Assessment - 04/05/16 0001    AROM   Left Shoulder Flexion 148 Degrees   Left Shoulder ABduction 148 Degrees                     [facility] Adult PT Treatment/Exercise - 04/05/16 0001    Shoulder Exercises: Supine   Horizontal ABduction Strengthening;10 reps;Theraband  yellow   Theraband Level (Shoulder Horizontal ABduction) Level 1 (Yellow)   External Rotation Strengthening;10 reps;Theraband   Theraband Level (Shoulder External Rotation) Level 1 (Yellow)   Flexion Strengthening;10 reps;Theraband  yellow- narrow then wide grip   Theraband Level (Shoulder Flexion) Level 1 (Yellow)   Other Supine Exercises D2 bilaterally x 10 reps with yellow theraband   Shoulder Exercises: Standing   Other Standing Exercises Finger Ladder for Lt UE abduction x 5. flexion x 5  Shoulder Exercises: Pulleys   Flexion 2 minutes   ABduction 2 minutes   Shoulder Exercises: Therapy Ball   Flexion 10 reps  With forward lean at top of stretch   ABduction 10 reps  with stretch at end   Manual Therapy   Manual Therapy Edema management;Myofascial release;Passive ROM;Manual Lymphatic Drainage (MLD)   Myofascial Release To Lt chest wall during abduction to help decrease pain of stretch   Manual Lymphatic Drainage (MLD) Short neck, 5 diaphragmatic breaths, Lt inguinal nodes and Lt axillo-inguinal anastomosis focusing on area of swelling near axilla.   Passive ROM Lt shoulder in supine into flexion, abduction and er                   Short  Term Clinic Goals - 04/05/16 1459    CC Short Term Goal  #1   Title Pt to demonstrate 150 degrees of left shoulder flexion to allow pt to wash hair with left hand   Baseline 137, 143 degrees 03/30/16, 04/05/16- 148 degrees   Time 4   Period Weeks   Status On-going   CC Short Term Goal  #2   Title Pt to demonstrate 135 degrees of left shoulder abduction to allow pt to reach out to sides   Baseline 92, 126 degrees 03/30/16, 148 degrees 04/05/16   Time 4   Period Weeks   Status Achieved             Long Term Clinic Goals - 03/24/16 1755    CC Long Term Goal  #1   Title Pt to obtain appropriate compression sleeve and glove for management of LUE lymphedema   Baseline current glove increases hand edema   Time 8   Period Weeks   Status New   CC Long Term Goal  #2   Title Pt to demonstrate 170 degrees of left shoulder abduction for improved ADLs   Baseline 92   Time 8   Period Weeks   Status New   CC Long Term Goal  #3   Title Pt to demonstrate 170 degrees of left shoulder flexion to allow pt to reach overhead   Baseline 137   Time 8   Period Weeks   Status New   CC Long Term Goal  #4   Title Pt to be independent with a home exercise program for continued management   Time 8   Period Weeks   Status New   CC Long Term Goal  #5   Title Pt to demonstrate left shoulder active external rotation to at least 80 degrees for improved ADLs   Baseline 48   Time 8   Period Weeks   Status New   CC Long Term Goal  #6   Title Pt to demonstrate gross 5/5 strength throughout L UE and shoulder to allow pt to return to PLOF   Time 8   Period Weeks   Status New   Additional Goals   Additional Goals Yes            Plan - 04/05/16 1500    Clinical Impression Statement Pt is progressing towards goals in therapy. She demonstrates improved left shoulder flexion and abduction and met her short term abduction range of motion goal. Pt was instructed in new resisted exercises with theraband  today. A compression sleeve, glove and bra have been ordered.    Rehab Potential Good   Clinical Impairments Affecting Rehab Potential hx of radiation   PT Frequency 3x /  week   PT Duration 8 weeks   PT Treatment/Interventions Manual lymph drainage;Manual techniques;Therapeutic exercise;Passive range of motion;ADLs/Self Care Home Management;Compression bandaging;Scar mobilization   PT Next Visit Plan strengthening exercises for LUE, add scapular series to home exercise program and give pt handout, myofascial left chest   Consulted and Agree with Plan of Care Patient      Patient will benefit from skilled therapeutic intervention in order to improve the following deficits and impairments:  Decreased range of motion, Decreased strength, Increased fascial restricitons, Pain, Impaired UE functional use, Increased edema, Decreased scar mobility  Visit Diagnosis: Pain in left shoulder  Stiffness of left shoulder, not elsewhere classified  Muscle weakness (generalized)  Postmastectomy lymphedema     Problem List Patient Active Problem List   Diagnosis Date Noted  . Genetic testing 09/22/2015  . Lymphedema 07/06/2015  . Arm pain 07/06/2015  . Heat intolerance 07/06/2015  . 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 Vaginal irritation 02/14/2015  . Breast cancer of lower-outer quadrant of left female breast ([place]) 07/26/2014    Alexia Freestone 04/05/2016, 3:03 PM  [place], [place], [address] Phone: [phone]   Fax:  [phone]  Name: [name] MRN: [id number] Date of Birth: [birth year]    Allyson Sabal, PT 04/05/2016 3:03 PM

## 2016-04-07 ENCOUNTER — Ambulatory Visit: Payer: Self-pay | Admitting: Physical Therapy

## 2016-04-09 ENCOUNTER — Ambulatory Visit: Payer: Self-pay | Admitting: Physical Therapy

## 2016-04-09 ENCOUNTER — Encounter: Payer: Self-pay | Admitting: Physical Therapy

## 2016-04-09 DIAGNOSIS — M6281 Muscle weakness (generalized): Secondary | ICD-10-CM

## 2016-04-09 DIAGNOSIS — M25612 Stiffness of left shoulder, not elsewhere classified: Secondary | ICD-10-CM

## 2016-04-09 DIAGNOSIS — M25512 Pain in left shoulder: Secondary | ICD-10-CM

## 2016-04-09 NOTE — Therapy (Addendum)
[place], [place], [address] Phone: [phone]   Fax:  [phone]  Physical Therapy Treatment  Patient Details  Name: [name] MRN: [id number] Date of Birth: [birth year] Referring Provider: Drue Second  Encounter Date: 04/09/2016      PT End of Session - 04/09/16 0908    Visit Number 6   Number of Visits 25   Date for PT Re-Evaluation 05/19/16   PT Start Time 0802   PT Stop Time 0848   PT Time Calculation (min) 46 min   Activity Tolerance Patient tolerated treatment well   Behavior During Therapy [facility] for tasks assessed/performed      Past Medical History  Diagnosis Date  . History of breast cancer 2015    left  . Anemia     takes iron supplement  . Multiple blisters 11/2015    tongue - states from chemo  . Broken tooth 12/01/2015    left upper back  . History of chemotherapy     finished 07/2015    Past Surgical History  Procedure Laterality Date  . Cesarean section  09/09/2007  . Breast lumpectomy Left 07/10/2014    Procedure: LEFT BREAST LUMPECTOMY;  Surgeon: Merrie Roof, MD;  Location: [place];  Service: General;  Laterality: Left;  . Portacath placement Right 09/06/2014    Procedure: INSERTION PORT-A-CATH;  Surgeon: Autumn Messing III, MD;  Location: [place];  Service: General;  Laterality: Right;  . Mastectomy w/ sentinel node biopsy Left 09/06/2014    Procedure: LEFT MASTECTOMY WITH SENTINEL LYMPH NODE BIOPSY/NODE MAPPING;  Surgeon: Autumn Messing III, MD;  Location: [place];  Service: General;  Laterality: Left;  . Port-a-cath removal N/A 12/08/2015    Procedure: REMOVAL PORT-A-CATH;  Surgeon: Autumn Messing III, MD;  Location: [place];  Service: General;  Laterality: N/A;    There were no vitals filed for this visit.      Subjective Assessment - 04/09/16 0806    Subjective Pt reports she has a little bit of swelling. She is not having  pain.    Pertinent History Patient underwent a left breast mastectomy in October 2015. She completed chemotherapy in March 2016 and completed radiation treatments in May 2016. She is currently undergoing tamoxifen oral therapy only.   Patient Stated Goals to lift my arm all the way up and without it hurting   Currently in Pain? No/denies   Pain Score 0-No pain                         [facility] Adult PT Treatment/Exercise - 04/09/16 0001    Shoulder Exercises: Supine   Horizontal ABduction Strengthening;10 reps;Theraband  yellow   Theraband Level (Shoulder Horizontal ABduction) Level 1 (Yellow)   External Rotation Strengthening;10 reps;Theraband   Theraband Level (Shoulder External Rotation) Level 1 (Yellow)   Flexion Strengthening;10 reps;Theraband  yellow- narrow then wide grip   Theraband Level (Shoulder Flexion) Level 1 (Yellow)   Other Supine Exercises D2 bilaterally x 10 reps with yellow theraband   Shoulder Exercises: Standing   External Rotation Strengthening;Both;10 reps;Theraband   Theraband Level (Shoulder External Rotation) Level 1 (Yellow)   Internal Rotation Strengthening;Both;10 reps;Theraband   Theraband Level (Shoulder Internal Rotation) Level 1 (Yellow)   Flexion Strengthening;Both;10 reps;Theraband  to shoulder height   Theraband Level (Shoulder Flexion) Level 1 (Yellow)   ABduction Strengthening;Both;10 reps;Theraband  to shoulder height   Theraband Level (  Shoulder ABduction) Level 1 (Yellow)   Extension Strengthening;Both;10 reps;Theraband   Theraband Level (Shoulder Extension) Level 1 (Yellow)   Row Strengthening;Both;10 reps;Theraband   Theraband Level (Shoulder Row) Level 1 (Yellow)   Other Standing Exercises Finger Ladder for Lt UE abduction x 5. flexion x 5   Other Standing Exercises 3 way shoulder against wall with 1 lb weight, shoulder adduction with yellow theraband x 10 reps   Shoulder Exercises: Pulleys   Flexion 2 minutes   ABduction  2 minutes   Shoulder Exercises: Therapy Ball   Flexion 10 reps  With forward lean at top of stretch   ABduction 10 reps  with stretch at end   Manual Therapy   Passive ROM Lt shoulder in supine into flexion, abduction and er                PT Education - 04/09/16 0911    Education provided Yes   Education Details standing shoulder exercises with band   Person(s) Educated Patient   Methods Explanation;Demonstration;Handout  interpreter translated handout   Comprehension Verbalized understanding;Returned demonstration;Verbal cues required;Tactile cues required           Short Term Clinic Goals - 04/09/16 0844    CC Short Term Goal  #1   Title Pt to demonstrate 150 degrees of left shoulder flexion to allow pt to wash hair with left hand   Baseline 137, 143 degrees 03/30/16, 04/05/16- 148 degrees, 04/09/16- 161 degrees   Status Achieved   CC Short Term Goal  #2   Title Pt to demonstrate 135 degrees of left shoulder abduction to allow pt to reach out to sides   Status Achieved             [place] Goals - 04/09/16 0845    CC Long Term Goal  #2   Title Pt to demonstrate 170 degrees of left shoulder abduction for improved ADLs   Baseline 92, 04/09/16- 162 degrees   Status On-going   CC Long Term Goal  #3   Title Pt to demonstrate 170 degrees of left shoulder flexion to allow pt to reach overhead   Baseline 137, 04/09/16- 161 degrees   CC Long Term Goal  #4   Title Pt to be independent with a home exercise program for continued management   Baseline No arm pain complaints today except with stretching, but patient has a headache.   Time 8   Period Weeks   Status On-going   CC Long Term Goal  #5   Title Pt to demonstrate left shoulder active external rotation to at least 80 degrees for improved ADLs   Baseline 48, 04/09/16- 64 degrees   CC Long Term Goal  #6   Title Pt to demonstrate gross 5/5 strength throughout L UE and shoulder to allow pt to return to  PLOF   Time [place] - 04/09/16 0850    Clinical Impression Statement Pt is making excellent progress towards goals. She has now met all short term goals. Her shoulder ROM has increased in all directions. Pt was instructed in numerous resisted exercises today and tolerated them all well. She was given theraband exercises to add to her current home exercise program.    Rehab Potential Good   Clinical Impairments Affecting Rehab Potential hx of radiation   PT Frequency 3x / week   PT Duration 8 weeks  PT Treatment/Interventions Manual lymph drainage;Manual techniques;Therapeutic exercise;Passive range of motion;ADLs/Self Care Home Management;Compression bandaging;Scar mobilization   PT Next Visit Plan assess for indep with new theraband shoulder exercises see instructions, myofascial left chest, continue strengthening and ROM exercises   Consulted and Agree with Plan of Care Patient      Patient will benefit from skilled therapeutic intervention in order to improve the following deficits and impairments:  Decreased range of motion, Decreased strength, Increased fascial restricitons, Pain, Impaired UE functional use, Increased edema, Decreased scar mobility  Visit Diagnosis: Pain in left shoulder  Stiffness of left shoulder, not elsewhere classified  Muscle weakness (generalized)     Problem List Patient Active Problem List   Diagnosis Date Noted  . Genetic testing 09/22/2015  . Lymphedema 07/06/2015  . Arm pain 07/06/2015  . Heat intolerance 07/06/2015  . 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 Vaginal irritation 02/14/2015  . Breast cancer of lower-outer quadrant of left female breast ([place]) 07/26/2014    [name] 04/09/2016, 9:12 AM      PHYSICAL THERAPY DISCHARGE SUMMARY  Visits from Start of Care: 6  Current functional level related to goals / functional outcomes: Unknown as pt did not show up for last 2  scheduled visits on 5/31 and 04/16/2016. She did not show up for 4 scheduled visits this episode    Remaining deficits: unknown   Education / Equipment: Home exercise  Plan: Patient agrees to discharge.  Patient goals were partially met. Patient is being discharged due to not returning since the last visit.  ?????       Maudry Diego, PT 04/16/2016 9:26 AM [place], [place], [address] Phone: [phone]   Fax:  [phone]  Name: [name] MRN: [id number] Date of Birth: [birth year]    Allyson Sabal, PT 04/09/2016 9:12 AM

## 2016-04-09 NOTE — Patient Instructions (Signed)
Strengthening: Resisted Internal Rotation   Hold tubing in left hand, elbow at side and forearm out. Rotate forearm in across body. Agarra el tubo en la mano derecha, codo al lado y la parte de la frente del brazo para fuera. Gira la parte de la frente del brazo alrededor el cuerpo. Repeat _10___ times per set. Do _1___ sets per session. Do _2___ sessions per day.  http://orth.exer.us/830   Copyright  VHI. All rights reserved.  Strengthening: Resisted External Rotation   Hold tubing in left hand, elbow at side and forearm across body. Rotate forearm out. Repeat on other side. Agarra el tubo con la mano izquierda, codo al lado y parte de la frente del brazo del lado opuesto del cuerpo. Gira el brazo para fuera. Repite del otro lado. Repeat _10___ times per set. Do _1___ sets per session. Do _2___ sessions per day. Repite diez veces cada conjunto. Haz 1 conjunto por sesion. Haz 2 sesiones cada dia. http://orth.exer.us/828   Copyright  VHI. All rights reserved.  Strengthening: Resisted Flexion   Hold tubing with both arms at side. Pull forward and up. Move shoulder through pain-free range of motion. Repeat __10__ times per set. Do ___1_ sets per session. Do __2__ sessions per day.  http://orth.exer.us/824   Copyright  VHI. All rights reserved.  Strengthening: Resisted Extension   Hold tubing in both hands, arm forward. Pull arm back, elbow straight. Agarra tubos [employer], brazos para delante, codo derecho. Repeat _10___ times per set. Do _1___ sets per session. Do _2___ sessions per day.Repite 10 veces cada conjunto. Haz 2 sesiones cada dia.  http://orth.exer.us/832   Copyright  VHI. All rights reserved.   (Home) Abduction: Lateral Resist (Anchor)    Opposite side toward anchor, left arm across body, lift arm out to side to shoulder height, keeping thumb up. Do on other side. Lado opuesto para la ancla, brazo izquierdo sobre el cuerpo, levanta el brazo para fuera  hasta la altura del [place], manteniendo pulgar para [place]. Haz en el otro lado. Repeat ___10_ times per set. Do __1__ sets per session. Do _2___ sessions per week. Repite 10 veces cada conjunto. Haz 1 conjunto por sesion.  Copyright  VHI. All rights reserved.   Adduction: Single Arm    Side toward anchor in shoulder width stance. Palm down, pull left arm down to thigh.Lado para la ancla en posicion de anchura de hombro.  Repeat 10__ times per set. Repeat with other arm. Do _1 sets per session. Do _2_ sessions per week. Repite 10 veces cada conjunto. Repite con el otro brazo. Anchor Height: Over Head  http://tub.exer.us/96   Copyright  VHI. All rights reserved.

## 2016-04-16 ENCOUNTER — Ambulatory Visit: Payer: Self-pay | Attending: Nurse Practitioner | Admitting: Physical Therapy

## 2016-04-21 MED FILL — TAMOXIFEN 20 MG TABLET: 20 | 30 days supply | Qty: 30 | Fill #2

## 2016-05-09 ENCOUNTER — Encounter (HOSPITAL_COMMUNITY): Payer: Self-pay | Admitting: *Deleted

## 2016-05-09 ENCOUNTER — Emergency Department (HOSPITAL_COMMUNITY)
Admission: EM | Admit: 2016-05-09 | Discharge: 2016-05-09 | Disposition: A | Discharge status: Home / Self Care | Payer: Self-pay | Attending: Dermatology | Admitting: Dermatology

## 2016-05-09 DIAGNOSIS — Z853 Personal history of malignant neoplasm of breast: Secondary | ICD-10-CM | POA: Insufficient documentation

## 2016-05-09 DIAGNOSIS — R109 Unspecified abdominal pain: Secondary | ICD-10-CM | POA: Insufficient documentation

## 2016-05-09 DIAGNOSIS — Z5321 Procedure and treatment not carried out due to patient leaving prior to being seen by health care provider: Secondary | ICD-10-CM | POA: Insufficient documentation

## 2016-05-09 LAB — COMPREHENSIVE METABOLIC PANEL
ALK PHOS: 117 U/L (ref 38–126)
ALT: 13 U/L — ABNORMAL LOW (ref 14–54)
AST: 20 U/L (ref 15–41)
Albumin: 3.8 g/dL (ref 3.5–5.0)
Anion gap: 6 (ref 5–15)
BILIRUBIN TOTAL: 0.1 mg/dL — AB (ref 0.3–1.2)
BUN: 12 mg/dL (ref 6–20)
CALCIUM: 9.3 mg/dL (ref 8.9–10.3)
CO2: 25 mmol/L (ref 22–32)
Chloride: 108 mmol/L (ref 101–111)
Creatinine, Ser: 0.63 mg/dL (ref 0.44–1.00)
GLUCOSE: 117 mg/dL — AB (ref 65–99)
POTASSIUM: 3.4 mmol/L — AB (ref 3.5–5.1)
Sodium: 139 mmol/L (ref 135–145)
TOTAL PROTEIN: 6.3 g/dL — AB (ref 6.5–8.1)

## 2016-05-09 LAB — URINALYSIS, ROUTINE W REFLEX MICROSCOPIC
BILIRUBIN URINE: NEGATIVE
Glucose, UA: NEGATIVE mg/dL
Hgb urine dipstick: NEGATIVE
KETONES UR: NEGATIVE mg/dL
NITRITE: NEGATIVE
Protein, ur: NEGATIVE mg/dL
SPECIFIC GRAVITY, URINE: 1.02 (ref 1.005–1.030)
pH: 6 (ref 5.0–8.0)

## 2016-05-09 LAB — I-STAT BETA HCG BLOOD, ED (MC, WL, AP ONLY)

## 2016-05-09 LAB — CBC
HEMATOCRIT: 32.1 % — AB (ref 36.0–46.0)
HEMOGLOBIN: 10.3 g/dL — AB (ref 12.0–15.0)
MCH: 20.8 pg — ABNORMAL LOW (ref 26.0–34.0)
MCHC: 32.1 g/dL (ref 30.0–36.0)
MCV: 64.8 fL — ABNORMAL LOW (ref 78.0–100.0)
Platelets: 185 10*3/uL (ref 150–400)
RBC: 4.95 MIL/uL (ref 3.87–5.11)
RDW: 15 % (ref 11.5–15.5)
WBC: 6.5 10*3/uL (ref 4.0–10.5)

## 2016-05-09 LAB — URINE MICROSCOPIC-ADD ON
Bacteria, UA: NONE SEEN
RBC / HPF: NONE SEEN RBC/hpf (ref 0–5)

## 2016-05-09 LAB — LIPASE, BLOOD: Lipase: 27 U/L (ref 11–51)

## 2016-05-09 NOTE — ED Notes (Signed)
Pt family came to [facility] and stated That the wanted to leave, Tech explained that there were only two patients in front and it would go to a room soon, but family stated that they still wanted to leave. Patient was encourage to stay but still left.

## 2016-05-09 NOTE — ED Notes (Signed)
She spoke to triage staff leaving wait too long

## 2016-05-09 NOTE — ED Notes (Signed)
reports onset of abd pain on Friday, radiates around to back. Denies any urinary symptoms, vaginal symptoms or n/v/d.

## 2016-05-22 ENCOUNTER — Telehealth: Payer: Self-pay | Admitting: *Deleted

## 2016-05-22 DIAGNOSIS — C50512 Malignant neoplasm of lower-outer quadrant of left female breast: Secondary | ICD-10-CM

## 2016-05-22 MED ORDER — TAMOXIFEN CITRATE 20 MG PO TABS
20.0000 mg | ORAL_TABLET | Freq: Every day | ORAL | Status: DC
Start: 2016-05-22 — End: 2016-07-23

## 2016-05-22 NOTE — Telephone Encounter (Signed)
Patient presented to [facility] for immediate refill of Tamoxifen. Pt states she is going out of the [place] for 1 week and is completely out of medication. Pt brings in empty bottle with 1 refill at [facility]. Pt requesting refill to be transferred to [facility] on [address]. Dr. Benay Spice- on call paged. Dr. Benay Spice authorized a partial refill to her local pharmacy.   Provided pt with written instructions in Spanish to make sure she obtains refill prior to running out.

## 2016-05-27 ENCOUNTER — Telehealth: Payer: Self-pay | Admitting: Hematology and Oncology

## 2016-05-27 ENCOUNTER — Other Ambulatory Visit: Payer: Self-pay

## 2016-05-27 ENCOUNTER — Other Ambulatory Visit (HOSPITAL_BASED_OUTPATIENT_CLINIC_OR_DEPARTMENT_OTHER): Payer: Self-pay

## 2016-05-27 ENCOUNTER — Encounter: Payer: Self-pay | Admitting: Hematology and Oncology

## 2016-05-27 ENCOUNTER — Ambulatory Visit (HOSPITAL_BASED_OUTPATIENT_CLINIC_OR_DEPARTMENT_OTHER): Payer: Self-pay | Admitting: Hematology and Oncology

## 2016-05-27 VITALS — BP 97/65 | HR 81 | Temp 98.3°F | Resp 18 | Ht 60.0 in | Wt 118.0 lb

## 2016-05-27 DIAGNOSIS — R21 Rash and other nonspecific skin eruption: Secondary | ICD-10-CM

## 2016-05-27 DIAGNOSIS — C50512 Malignant neoplasm of lower-outer quadrant of left female breast: Secondary | ICD-10-CM

## 2016-05-27 DIAGNOSIS — B029 Zoster without complications: Secondary | ICD-10-CM

## 2016-05-27 LAB — CBC WITH DIFFERENTIAL/PLATELET
BASO%: 0.8 % (ref 0.0–2.0)
Basophils Absolute: 0.1 10*3/uL (ref 0.0–0.1)
EOS%: 1.8 % (ref 0.0–7.0)
Eosinophils Absolute: 0.1 10*3/uL (ref 0.0–0.5)
HEMATOCRIT: 35.6 % (ref 34.8–46.6)
HGB: 11.3 g/dL — ABNORMAL LOW (ref 11.6–15.9)
LYMPH#: 1.8 10*3/uL (ref 0.9–3.3)
LYMPH%: 22.8 % (ref 14.0–49.7)
MCH: 20.3 pg — AB (ref 25.1–34.0)
MCHC: 31.6 g/dL (ref 31.5–36.0)
MCV: 64.2 fL — ABNORMAL LOW (ref 79.5–101.0)
MONO#: 0.3 10*3/uL (ref 0.1–0.9)
MONO%: 4.2 % (ref 0.0–14.0)
NEUT#: 5.5 10*3/uL (ref 1.5–6.5)
NEUT%: 70.4 % (ref 38.4–76.8)
Platelets: 206 10*3/uL (ref 145–400)
RBC: 5.54 10*6/uL — AB (ref 3.70–5.45)
RDW: 15.3 % — AB (ref 11.2–14.5)
WBC: 7.9 10*3/uL (ref 3.9–10.3)

## 2016-05-27 MED ORDER — VALACYCLOVIR HCL 1 G PO TABS: 1000.0000 mg | ORAL_TABLET | Freq: Two times a day (BID) | ORAL | Status: DC

## 2016-05-27 MED FILL — TAMOXIFEN 20 MG TABLET: 20 | 30 days supply | Qty: 30 | Fill #3

## 2016-05-27 MED FILL — valACYclovir HCL 1 GM TABS: 1 | 7 days supply | Qty: 14 | Fill #0

## 2016-05-27 NOTE — Progress Notes (Signed)
Approached in hallway by interpreter Marly.  Pt called her informing her she as having a rash to her hands and legs.  Pt reports through interpreter the rash is warm to touch, itchy, and swollen.  Pt reports concern that it is some sort of infection as her son has similar rash.  Discussed with Dr. Lindi Adie who wishes to obtain labs and have rash evaluated at 2:30pm.  Called and confirmed time with [facility], interpreter who verbalized she would call pt to confirm.  Orders/POF placed per Dr. Lindi Adie.

## 2016-05-27 NOTE — Progress Notes (Signed)
Patient Care Team: Nicholas Lose, MD as PCP - General (Hematology and Oncology)  DIAGNOSIS: Breast cancer of lower-outer quadrant of left female breast [facility])   Staging form: Breast, AJCC 7th Edition     Clinical: No stage assigned - Unsigned     Pathologic: Stage IIIC (T2, N3a, cM0) - Signed by Rulon Eisenmenger, MD on 09/11/2014   SUMMARY OF ONCOLOGIC HISTORY:   Breast cancer of lower-outer quadrant of left female breast ([place])   06/06/2014 Imaging Palpable Left breast mass at 12:00 position 1.9 cm, initial biopsy 06/12/2014 revealed fibrocystic changes.   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 The genes tested were ATM, BARD1, BRCA1, BRCA2, BRIP1, CDH1, CHEK2, MRE11A, MUTYH, NBN, NF1, PALB2, PTEN, RAD50, RAD51C, RAD51D, and TP53.   10/04/2014 - 01/20/2015 Chemotherapy Patient was started on adjuvant chemotherapy with Taxotere, Carboplatin, Herceptin, Perjeta.     02/11/2015 - 03/25/2015 Radiation Therapy Adjuvant radiation therapy   05/01/2015 -  Anti-estrogen oral therapy tamoxifen 20 mg daily    CHIEF COMPLIANT: Urgent visit regarding rash on her right thigh as well as her fingers and right elbow  INTERVAL HISTORY: [name] is a 30 year old with above-mentioned history of left breast cancer currently on tamoxifen adjuvant therapy. She was having hot flashes but she was able to manage the side effects fairly well. She went to the beach vacation and she and her son came down  with a rash. Her rashes and right groin area it is maculopapular with grouped vesicles. Her son also has a similar rash on his tongue and his face. She is also complaining of itching of the tips of her fingers. The fingers appear to have papular lesions along with swelling of the left arm.  REVIEW OF SYSTEMS:   Constitutional: Denies fevers, chills or abnormal weight loss Eyes: Denies blurriness of vision Ears, nose, mouth, throat, and face: Denies mucositis or sore throat Respiratory: Denies cough, dyspnea or wheezes Cardiovascular: Denies palpitation, chest discomfort Gastrointestinal:  Denies nausea, heartburn or change in bowel habits Skin: Denies abnormal skin rashes Lymphatics: Denies new lymphadenopathy or easy bruising Neurological:Denies numbness, tingling or new weaknesses Behavioral/Psych: Mood is stable, no new changes  Extremities: Papular lesions the tips of the fingers, maculopapular rash in the right groin All other systems were reviewed with the patient and are negative.  I have reviewed the past medical history, past surgical history, social history and family history with the patient and they are unchanged from previous note.  ALLERGIES:  is allergic to zofran.  MEDICATIONS:  Current Outpatient Prescriptions  Medication Sig Dispense Refill  . Calcium Carbonate (CALCIUM 600 PO) Take by mouth. Reported on 03/24/2016    . IRON PO Take by mouth. Reported on 03/24/2016    . Multiple Vitamin (MULTIVITAMIN) tablet Take 1 tablet by mouth daily.    Marland Kitchen oxyCODONE-acetaminophen (ROXICET) 5-325 MG tablet Take 1-2 tablets by mouth every 4 (four) hours as needed. (Patient not taking: Reported on 03/24/2016) 30 tablet 0  . tamoxifen (NOLVADEX) 20 MG tablet Take 1 tablet (20 mg total) by  mouth daily. 10 tablet 0  . valACYclovir (VALTREX) 1000 MG tablet Take 1 tablet (1,000 mg total) by mouth 2 (two) times daily. 14 tablet 0   No current facility-administered medications for this visit.    Facility-Administered Medications Ordered in Other Visits  Medication Dose Route Frequency Provider Last Rate Last Dose  . sodium chloride 0.9 % injection 10 mL  10 mL Intravenous PRN Nicholas Lose, MD   10 mL at 12/09/14 1632    PHYSICAL EXAMINATION: ECOG PERFORMANCE STATUS: 1 - Symptomatic but completely ambulatory  Filed Vitals:   05/27/16 1454  BP: 97/65  Pulse: 81  Temp: 98.3 F (36.8 C)  Resp: 18   Filed Weights   05/27/16 1454  Weight: 118 lb (53.524 kg)    GENERAL:alert, no distress and comfortable SKIN: skin color, texture, turgor are normal, no rashes or significant lesions EYES: normal, Conjunctiva are pink and non-injected, sclera clear OROPHARYNX:no exudate, no erythema and lips, buccal mucosa, and tongue normal  NECK: supple, thyroid normal size, non-tender, without nodularity LYMPH:  no palpable lymphadenopathy in the cervical, axillary or inguinal LUNGS: clear to auscultation and percussion with normal breathing effort HEART: regular rate & rhythm and no murmurs and no lower extremity edema ABDOMEN:abdomen soft, non-tender and normal bowel sounds MUSCULOSKELETAL:no cyanosis of digits and no clubbing  NEURO: alert & oriented x 3 with fluent speech, no focal motor/sensory deficits EXTREMITIES: Left arm edema, left elbow eczema, tips of the fingers certainly have papular itching lesions. Right groin grouped vesicles with maculopapular rash suggestive of shingles.  LABORATORY DATA:  I have reviewed the data as listed   Chemistry      Component Value Date/Time   NA 139 05/09/2016 1911   NA 143 09/04/2015 1111   K 3.4* 05/09/2016 1911   K 4.2 09/04/2015 1111   CL 108 05/09/2016 1911   CO2 25 05/09/2016 1911   CO2 26 09/04/2015 1111   BUN 12 05/09/2016 1911   BUN 9.3 09/04/2015 1111   CREATININE 0.63 05/09/2016 1911   CREATININE 0.7 09/04/2015 1111      Component Value Date/Time   CALCIUM 9.3 05/09/2016 1911   CALCIUM 9.6 09/04/2015 1111   ALKPHOS 117  05/09/2016 1911   ALKPHOS 73 09/04/2015 1111   AST 20 05/09/2016 1911   AST 12 09/04/2015 1111   ALT 13* 05/09/2016 1911   ALT <9 09/04/2015 1111   BILITOT 0.1* 05/09/2016 1911   BILITOT 0.38 09/04/2015 1111       Lab Results  Component Value Date   WBC 7.9 05/27/2016   HGB 11.3* 05/27/2016   HCT 35.6 05/27/2016   MCV 64.2* 05/27/2016   PLT 206 05/27/2016   NEUTROABS 5.5 05/27/2016     ASSESSMENT & PLAN:  Shingles  Shingles on the rigtt thigh:  Maculopapular Rash I will start patient on valtrex 1000 gm bid X 7 days  Itching and papular changes at finger tips: Unclear etiology. Could be a reaction to food that she consumed or some object that she came in contact with that led to this reaction. I encouraged her to apply hydrocortisone ointment to her fingers.  Eczema left elbow: Also encourage her to apply hydrocortisone. Return to clinic in one week to assess improvement in her symptoms.  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 Positive for lymphovascular invasion, extracapsular tumor extension  grade 3 ER 53%, PR 30%, HER-2 positive ratio 5.95, Ki-67 33%, T2, N3, M0 stage IIIc with high-grade DCIS status post left mastectomy 09/06/2014; completed adjuvant chemotherapy 01/20/2015 and completed Herceptin maintenance October 2016 and radiation started 02/11/2015 completed 03/25/2015 by Dr. Isidore Moos  Current treatment: Tamoxifen 20 mg daily started 05/01/2015 10 years Tamoxifen toxicities: 1. Mild nausea: I instructed her to take tamoxifen after eating food 2. Mild pinkish discharge after sex: resolved 3. Heat intolerance 4. Itching sensation when she gets the hot flashes 5. Musculoskeletal aches and pains: I encouraged her to exercise to relieve the aches and pains as well as to use glucosamine. We also discussed about  drinking tonic water to bedtime to help her sleep.     No orders of the defined types were placed in this encounter.   The patient has a good understanding of the overall plan. she agrees with it. she will call with any problems that may develop before the next visit here.   Rulon Eisenmenger, MD 05/27/2016

## 2016-05-27 NOTE — Assessment & Plan Note (Signed)
Shingles on the rigtt thigh:  Maculopapular Rash I will start patient on valtrex 1000 gm bid X 7 days  Itching and papular changes at finger tips: Unclear etiology. Could be a reaction to food that she consumed or some object that she came in contact with that led to this reaction. I encouraged her to apply hydrocortisone ointment to her fingers.  Eczema left elbow: Also encourage her to apply hydrocortisone. Return to clinic in one week to assess improvement in her symptoms.

## 2016-05-27 NOTE — Telephone Encounter (Signed)
per pof to sch pt appt-gave pt copy of avs °

## 2016-05-27 NOTE — Assessment & Plan Note (Signed)
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 Positive for lymphovascular invasion, extracapsular tumor extension grade 3 ER 53%, PR 30%, HER-2 positive ratio 5.95, Ki-67 33%, T2, N3, M0 stage IIIc with high-grade DCIS status post left mastectomy 09/06/2014; completed adjuvant chemotherapy 01/20/2015 and completed Herceptin maintenance October 2016 and radiation started 02/11/2015 completed 03/25/2015 by Dr. Isidore Moos  Current treatment: Tamoxifen 20 mg daily started 05/01/2015 10 years Tamoxifen toxicities: 1. Mild nausea: I instructed her to take tamoxifen after eating food 2. Mild pinkish discharge after sex: resolved 3. Heat intolerance 4. Itching sensation when she gets the hot flashes 5. Musculoskeletal aches and pains: I encouraged her to exercise to relieve the aches and pains as well as to use glucosamine. We also discussed about drinking tonic water to bedtime to help her sleep.

## 2016-06-03 ENCOUNTER — Telehealth: Payer: Self-pay | Admitting: Hematology and Oncology

## 2016-06-03 ENCOUNTER — Encounter: Payer: Self-pay | Admitting: Hematology and Oncology

## 2016-06-03 ENCOUNTER — Ambulatory Visit (HOSPITAL_BASED_OUTPATIENT_CLINIC_OR_DEPARTMENT_OTHER): Payer: Self-pay | Admitting: Hematology and Oncology

## 2016-06-03 VITALS — BP 99/65 | HR 68 | Temp 98.3°F | Resp 17 | Wt 117.1 lb

## 2016-06-03 DIAGNOSIS — C50512 Malignant neoplasm of lower-outer quadrant of left female breast: Secondary | ICD-10-CM

## 2016-06-03 DIAGNOSIS — B029 Zoster without complications: Secondary | ICD-10-CM

## 2016-06-03 NOTE — Progress Notes (Signed)
Patient Care Team: Nicholas Lose, MD as PCP - General (Hematology and Oncology)  DIAGNOSIS: Breast cancer of lower-outer quadrant of left female breast [facility])   Staging form: Breast, AJCC 7th Edition     Clinical: No stage assigned - Unsigned     Pathologic: Stage IIIC (T2, N3a, cM0) - Signed by Rulon Eisenmenger, MD on 09/11/2014   SUMMARY OF ONCOLOGIC HISTORY:   Breast cancer of lower-outer quadrant of left female breast ([place])   06/06/2014 Imaging Palpable Left breast mass at 12:00 position 1.9 cm, initial biopsy 06/12/2014 revealed fibrocystic changes.   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 The genes tested were ATM, BARD1, BRCA1, BRCA2, BRIP1, CDH1, CHEK2, MRE11A, MUTYH, NBN, NF1, PALB2, PTEN, RAD50, RAD51C, RAD51D, and TP53.   10/04/2014 - 01/20/2015 Chemotherapy Patient was started on adjuvant chemotherapy with Taxotere, Carboplatin, Herceptin, Perjeta.     02/11/2015 - 03/25/2015 Radiation Therapy Adjuvant radiation therapy   05/01/2015 -  Anti-estrogen oral therapy tamoxifen 20 mg daily    CHIEF COMPLIANT: Follow-up of shingles  INTERVAL HISTORY: [name] is a 30 year old with above-mentioned history of left breast cancer currently on tamoxifen therapy. She had a rash on her thigh which appeared to be shingles and I prescribed Valtrex 1000 g daily. Her son also had a lesion on the lips which was not treated with antiviral medications. She has recovered very well from  the rash. She also had a rash in the tips of the fingers in both hands all of that had improved already.  REVIEW OF SYSTEMS:   Constitutional: Denies fevers, chills or abnormal weight loss Eyes: Denies blurriness of vision Ears, nose, mouth, throat, and face: Denies mucositis or sore throat Respiratory: Denies cough, dyspnea or wheezes Cardiovascular: Denies palpitation, chest discomfort Gastrointestinal:  Denies nausea, heartburn or change in bowel habits Skin: Shingles rash is healed up Lymphatics: Denies new lymphadenopathy or easy bruising Neurological:Denies numbness, tingling or new weaknesses Behavioral/Psych: Mood is stable, no new changes  Extremities: No lower extremity edema Breast:  Bilateral mastectomies All other systems were reviewed with the patient and are negative.  I have reviewed the past medical history, past surgical history, social history and family history with the patient and they are unchanged from previous note.  ALLERGIES:  is allergic to zofran.  MEDICATIONS:  Current Outpatient Prescriptions  Medication Sig Dispense Refill  . Calcium Carbonate (CALCIUM 600 PO) Take by mouth. Reported on 03/24/2016    . IRON PO Take by mouth. Reported on 03/24/2016    . Multiple Vitamin (MULTIVITAMIN) tablet Take 1 tablet by mouth daily.    Marland Kitchen oxyCODONE-acetaminophen (ROXICET) 5-325 MG tablet Take 1-2 tablets by mouth every 4 (four) hours as needed. (Patient not taking: Reported on 03/24/2016) 30 tablet 0  . tamoxifen (NOLVADEX) 20 MG tablet Take 1 tablet (20 mg total) by mouth daily. 10 tablet 0  . valACYclovir (VALTREX) 1000 MG tablet Take 1 tablet (1,000 mg total) by mouth 2 (two) times daily. 14 tablet 0   No current facility-administered medications for this visit.  Facility-Administered Medications Ordered in Other Visits  Medication Dose Route Frequency Provider Last Rate Last Dose  . sodium chloride 0.9 % injection 10 mL  10 mL Intravenous PRN Nicholas Lose, MD    10 mL at 12/09/14 1632    PHYSICAL EXAMINATION: ECOG PERFORMANCE STATUS: 1 - Symptomatic but completely ambulatory  Filed Vitals:   06/03/16 1049  BP: 99/65  Pulse: 68  Temp: 98.3 F (36.8 C)  Resp: 17   Filed Weights   06/03/16 1049  Weight: 117 lb 1.6 oz (53.116 kg)    GENERAL:alert, no distress and comfortable SKIN: skin color, texture, turgor are normal, no rashes or significant lesions EYES: normal, Conjunctiva are pink and non-injected, sclera clear OROPHARYNX:no exudate, no erythema and lips, buccal mucosa, and tongue normal  NECK: supple, thyroid normal size, non-tender, without nodularity LYMPH:  no palpable lymphadenopathy in the cervical, axillary or inguinal LUNGS: clear to auscultation and percussion with normal breathing effort HEART: regular rate & rhythm and no murmurs and no lower extremity edema ABDOMEN:abdomen soft, non-tender and normal bowel sounds MUSCULOSKELETAL:no cyanosis of digits and no clubbing  NEURO: alert & oriented x 3 with fluent speech, no focal motor/sensory deficits EXTREMITIES: No lower extremity edema LABORATORY DATA:  I have reviewed the data as listed   Chemistry      Component Value Date/Time   NA 139 05/09/2016 1911   NA 143 09/04/2015 1111   K 3.4* 05/09/2016 1911   K 4.2 09/04/2015 1111   CL 108 05/09/2016 1911   CO2 25 05/09/2016 1911   CO2 26 09/04/2015 1111   BUN 12 05/09/2016 1911   BUN 9.3 09/04/2015 1111   CREATININE 0.63 05/09/2016 1911   CREATININE 0.7 09/04/2015 1111      Component Value Date/Time   CALCIUM 9.3 05/09/2016 1911   CALCIUM 9.6 09/04/2015 1111   ALKPHOS 117 05/09/2016 1911   ALKPHOS 73 09/04/2015 1111   AST 20 05/09/2016 1911   AST 12 09/04/2015 1111   ALT 13* 05/09/2016 1911   ALT <9 09/04/2015 1111   BILITOT 0.1* 05/09/2016 1911   BILITOT 0.38 09/04/2015 1111       Lab Results  Component Value Date   WBC 7.9 05/27/2016   HGB 11.3* 05/27/2016   HCT 35.6 05/27/2016   MCV 64.2*  05/27/2016   PLT 206 05/27/2016   NEUTROABS 5.5 05/27/2016     ASSESSMENT & PLAN:  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 Positive for lymphovascular invasion, extracapsular tumor extension grade 3 ER 53%, PR 30%, HER-2 positive ratio 5.95, Ki-67 33%, T2, N3, M0 stage IIIc with high-grade DCIS status post left mastectomy 09/06/2014; completed adjuvant chemotherapy 01/20/2015 and completed Herceptin maintenance October 2016 and radiation started 02/11/2015 completed 03/25/2015 by Dr. Isidore Moos  Current treatment: Tamoxifen 20 mg daily started 05/01/2015 10 years Tamoxifen toxicities: 1. Mild nausea: I instructed her to take tamoxifen after eating food 2. Heat intolerance 3. Itching sensation when she gets the hot flashes 4. Musculoskeletal aches and pains  Return to clinic in 6 months for follow-up  Shingles Right thigh shingles: Treated with Valtrex on 05/27/2016 Imaging of the fingertips: Treated with hydrocortisone ointment Eczema left elbow: Hydrocortisone ointment   No orders of the defined types were placed in this encounter.   The patient has a good understanding of the overall plan. she agrees with  it. she will call with any problems that may develop before the next visit here.   Rulon Eisenmenger, MD 06/03/2016

## 2016-06-03 NOTE — Assessment & Plan Note (Signed)
Right thigh shingles: Treated with Valtrex on 05/27/2016 Imaging of the fingertips: Treated with hydrocortisone ointment Eczema left elbow: Hydrocortisone ointment

## 2016-06-03 NOTE — Telephone Encounter (Signed)
appt made and avs printed °

## 2016-06-03 NOTE — Assessment & Plan Note (Signed)
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 Positive for lymphovascular invasion, extracapsular tumor extension grade 3 ER 53%, PR 30%, HER-2 positive ratio 5.95, Ki-67 33%, T2, N3, M0 stage IIIc with high-grade DCIS status post left mastectomy 09/06/2014; completed adjuvant chemotherapy 01/20/2015 and completed Herceptin maintenance October 2016 and radiation started 02/11/2015 completed 03/25/2015 by Dr. Isidore Moos  Current treatment: Tamoxifen 20 mg daily started 05/01/2015 10 years Tamoxifen toxicities: 1. Mild nausea: I instructed her to take tamoxifen after eating food 2. Heat intolerance 3. Itching sensation when she gets the hot flashes 4. Musculoskeletal aches and pains  Return to clinic in 6 months for follow-up

## 2016-07-23 ENCOUNTER — Other Ambulatory Visit: Payer: Self-pay

## 2016-07-23 DIAGNOSIS — C50512 Malignant neoplasm of lower-outer quadrant of left female breast: Secondary | ICD-10-CM

## 2016-07-23 MED ORDER — TAMOXIFEN CITRATE 20 MG PO TABS: 20.0000 mg | ORAL_TABLET | Freq: Every day | ORAL | 3 refills | Status: DC

## 2016-07-23 MED FILL — TAMOXIFEN 20 MG TABLET: 20 | 90 days supply | Qty: 90 | Fill #0

## 2016-09-09 ENCOUNTER — Telehealth: Payer: Self-pay

## 2016-09-09 ENCOUNTER — Other Ambulatory Visit: Payer: Self-pay

## 2016-09-09 ENCOUNTER — Encounter (HOSPITAL_COMMUNITY): Payer: Self-pay

## 2016-09-09 ENCOUNTER — Ambulatory Visit (HOSPITAL_COMMUNITY)
Admission: RE | Admit: 2016-09-09 | Discharge: 2016-09-09 | Disposition: A | Discharge status: Home / Self Care | Payer: Self-pay | Source: Ambulatory Visit | Attending: Obstetrics and Gynecology | Admitting: Obstetrics and Gynecology

## 2016-09-09 VITALS — BP 102/64 | Ht 62.0 in | Wt 115.0 lb

## 2016-09-09 DIAGNOSIS — M545 Low back pain: Secondary | ICD-10-CM

## 2016-09-09 DIAGNOSIS — N644 Mastodynia: Secondary | ICD-10-CM

## 2016-09-09 DIAGNOSIS — Z1239 Encounter for other screening for malignant neoplasm of breast: Secondary | ICD-10-CM

## 2016-09-09 DIAGNOSIS — C50512 Malignant neoplasm of lower-outer quadrant of left female breast: Secondary | ICD-10-CM

## 2016-09-09 NOTE — Progress Notes (Signed)
Spoke with [facility] to schedule next available diagnostic mammogram for left breast.  Appt set for 10/31 at 08:45.  Mcarthur Rossetti with appt date and time who will speak with pt directly to convey this information.

## 2016-09-09 NOTE — Patient Instructions (Signed)
Explained breast self awareness with [name]. Patient did not need a Pap smear today due to last Pap smear was 02/27/2015. Let her know [facility] will cover Pap smears and HPV typing every 3 years until age 29 unless has a history of abnormal Pap smears. Referred patient to [facility] for a right breast diagnostic mammogram and possible breast ultrasound. Appointment scheduled for Tueseday, September 14, 2016 at 0830.       Patient aware of appointment and will be there.[name] verbalized understanding.  Elazar Argabright, Arvil Chaco, RN 4:14 PM

## 2016-09-09 NOTE — Progress Notes (Signed)
Complaints of right breast and axillary pain that comes and goes x 4 days. Patient rated pain at a 4 out of 10.  Pap Smear:  Pap smear not completed today. Last Pap smear was 02/27/2015 at [facility] clinic and normal with negative HPV. Per patient has no history of an abnormal Pap smear. Last Pap smear result is in EPIC.  Physical exam: Breasts Right breast larger than left breast due to history of a left breast mastectomy for breast cancer 09/06/2014. No skin abnormalities right breast. Scar observed on left breast area from mastectomy. No nipple retraction right breast. No nipple discharge right breast. No lymphadenopathy. No lumps palpated bilateral breasts. No complaints of pain or tenderness on exam. Referred patient to [facility] for a right breast diagnostic mammogram and possible breast ultrasound. Appointment scheduled for Tueseday, September 14, 2016 at 0830.        Pelvic/Bimanual No Pap smear completed today since last Pap smear and HPV typing was completed 02/27/2015. Pap smear not indicated per BCCCP guidelines.   Smoking History: Patient has never smoked.  Patient Navigation: Patient education provided. Access to services provided for patient through [facility] program. Spanish interpreter provided.  Used Spanish interpreter [employer] from [place].

## 2016-09-09 NOTE — Progress Notes (Signed)
Received message from [place] with interpreter services stating that pt called her with concerns regarding new onset of left breast and axilla pain x last 4 days.  Pt also experiencing sharp, stabbing, burning pain to back since yesterday which remains more constant.  Per Almyra Free, pt reports extreme fatigue as well.  Reviewed information with Dr. Lindi Adie who wishes for pt to have bone scan/CT CAP/left diagnostic mammogram.  Orders entered for these and information relayed to Almyra Free to interpret to pt.  Message sent to managed care department so authorization can be obtained and scans can be ordered ASAP.

## 2016-09-09 NOTE — Telephone Encounter (Signed)
Called Almyra Free back to discuss all upcoming appt dates and times so she can explain to the pt.  Pt is scheduled for left breast diagnostic mammogram and [place] if needed on 10/31 at [facility].  On 11/2 pt is scheduled for Bone Scan at 12 with return time of 3.  CT CAP will be performed in between starting at 12:30.  Pt is to obtain oral contrast from our facility and drink 1st bottle of contrast at 10:30 and 2nd bottle 11:30.  Pt to remain NPO after 08:30.  All information given to Almyra Free who verbalizes understanding and states she will let pt know.  Almyra Free states she will contact us if she or the pt has any additional questions or concerns or new symptoms.

## 2016-09-13 ENCOUNTER — Encounter (HOSPITAL_COMMUNITY): Payer: Self-pay | Admitting: *Deleted

## 2016-09-16 ENCOUNTER — Encounter (HOSPITAL_COMMUNITY)
Admission: RE | Admit: 2016-09-16 | Discharge: 2016-09-16 | Disposition: A | Discharge status: Home / Self Care | Payer: Self-pay | Source: Ambulatory Visit | Attending: Hematology and Oncology | Admitting: Hematology and Oncology

## 2016-09-16 ENCOUNTER — Encounter (HOSPITAL_COMMUNITY): Payer: Self-pay

## 2016-09-16 ENCOUNTER — Ambulatory Visit (HOSPITAL_COMMUNITY)
Admission: RE | Admit: 2016-09-16 | Discharge: 2016-09-16 | Disposition: A | Discharge status: Home / Self Care | Payer: Self-pay | Source: Ambulatory Visit | Attending: Hematology and Oncology | Admitting: Hematology and Oncology

## 2016-09-16 DIAGNOSIS — C50512 Malignant neoplasm of lower-outer quadrant of left female breast: Secondary | ICD-10-CM

## 2016-09-16 DIAGNOSIS — N644 Mastodynia: Secondary | ICD-10-CM

## 2016-09-16 DIAGNOSIS — M545 Low back pain: Secondary | ICD-10-CM

## 2016-09-16 DIAGNOSIS — Z9012 Acquired absence of left breast and nipple: Secondary | ICD-10-CM | POA: Insufficient documentation

## 2016-09-16 MED ORDER — IOPAMIDOL (ISOVUE-300) INJECTION 61%
100.0000 mL | Freq: Once | INTRAVENOUS | Status: AC | PRN
  Administered 2016-09-16: 100 mL via INTRAVENOUS

## 2016-09-16 MED ORDER — TECHNETIUM TC 99M MEDRONATE IV KIT
20.1000 | PACK | Freq: Once | INTRAVENOUS | Status: AC | PRN
  Administered 2016-09-16: 20.1 via INTRAVENOUS

## 2016-09-17 ENCOUNTER — Telehealth: Payer: Self-pay

## 2016-09-17 NOTE — Telephone Encounter (Signed)
[name] with interpreting services stopped in to ask if pts results were back.  Reviewed scan results with Dr. Lindi Adie who states there is no concern for metastatic disease and no abnormalities.  Mammogram performed at [facility] and thus records not available in [place].  I will call to receive these results.  [name] verbalized understanding to inform pt that scans show no concern or abnormalities.

## 2016-10-06 ENCOUNTER — Encounter (HOSPITAL_COMMUNITY): Payer: Self-pay | Admitting: *Deleted

## 2016-11-03 ENCOUNTER — Other Ambulatory Visit: Payer: Self-pay | Admitting: Hematology and Oncology

## 2016-11-23 ENCOUNTER — Other Ambulatory Visit: Payer: Self-pay | Admitting: Nurse Practitioner

## 2016-11-25 ENCOUNTER — Encounter: Payer: Self-pay | Admitting: Emergency Medicine

## 2016-12-02 NOTE — Assessment & Plan Note (Signed)
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 Positive for lymphovascular invasion, extracapsular tumor extension grade 3 ER 53%, PR 30%, HER-2 positive ratio 5.95, Ki-67 33%, T2, N3, M0 stage IIIc with high-grade DCIS status post left mastectomy 09/06/2014; completed adjuvant chemotherapy 01/20/2015 and completed Herceptin maintenance October 2016 and radiation started 02/11/2015 completed 03/25/2015 by Dr. Isidore Moos  Current treatment: Tamoxifen 20 mg daily started 05/01/2015 10 years Tamoxifen toxicities: 1. Mild nausea: I instructed her to take tamoxifen after eating food 2. Heat intolerance 3. Itching sensation when she gets the hot flashes 4. Musculoskeletal aches and pains  Shingles Right thigh shingles: Treated with Valtrex on 05/27/2016 Imaging of the fingertips: Treated with hydrocortisone ointment Eczema left elbow: Hydrocortisone ointment  Return to clinic in 1 year for follow-up

## 2016-12-03 ENCOUNTER — Ambulatory Visit (HOSPITAL_BASED_OUTPATIENT_CLINIC_OR_DEPARTMENT_OTHER): Payer: Self-pay

## 2016-12-03 ENCOUNTER — Encounter: Payer: Self-pay | Admitting: Hematology and Oncology

## 2016-12-03 ENCOUNTER — Ambulatory Visit (HOSPITAL_BASED_OUTPATIENT_CLINIC_OR_DEPARTMENT_OTHER): Payer: Self-pay | Admitting: Hematology and Oncology

## 2016-12-03 VITALS — BP 95/51 | HR 72 | Temp 97.7°F | Resp 18 | Ht 62.0 in | Wt 116.9 lb

## 2016-12-03 DIAGNOSIS — C50512 Malignant neoplasm of lower-outer quadrant of left female breast: Secondary | ICD-10-CM

## 2016-12-03 DIAGNOSIS — Z170 Estrogen receptor positive status [ER+]: Secondary | ICD-10-CM

## 2016-12-03 DIAGNOSIS — D509 Iron deficiency anemia, unspecified: Secondary | ICD-10-CM

## 2016-12-03 LAB — CBC WITH DIFFERENTIAL/PLATELET
BASO%: 1.4 % (ref 0.0–2.0)
BASOS ABS: 0.1 10*3/uL (ref 0.0–0.1)
EOS%: 2.4 % (ref 0.0–7.0)
Eosinophils Absolute: 0.2 10*3/uL (ref 0.0–0.5)
HEMATOCRIT: 35.6 % (ref 34.8–46.6)
HGB: 11.2 g/dL — ABNORMAL LOW (ref 11.6–15.9)
LYMPH%: 32.3 % (ref 14.0–49.7)
MCH: 20.1 pg — AB (ref 25.1–34.0)
MCHC: 31.4 g/dL — ABNORMAL LOW (ref 31.5–36.0)
MCV: 64 fL — AB (ref 79.5–101.0)
MONO#: 0.4 10*3/uL (ref 0.1–0.9)
MONO%: 5.8 % (ref 0.0–14.0)
NEUT#: 3.8 10*3/uL (ref 1.5–6.5)
NEUT%: 58.1 % (ref 38.4–76.8)
Platelets: 231 10*3/uL (ref 145–400)
RBC: 5.55 10*6/uL — ABNORMAL HIGH (ref 3.70–5.45)
RDW: 15.5 % — ABNORMAL HIGH (ref 11.2–14.5)
WBC: 6.5 10*3/uL (ref 3.9–10.3)
lymph#: 2.1 10*3/uL (ref 0.9–3.3)

## 2016-12-03 LAB — COMPREHENSIVE METABOLIC PANEL
ALBUMIN: 4.1 g/dL (ref 3.5–5.0)
ALK PHOS: 96 U/L (ref 40–150)
ALT: 10 U/L (ref 0–55)
AST: 15 U/L (ref 5–34)
Anion Gap: 7 mEq/L (ref 3–11)
BILIRUBIN TOTAL: 0.46 mg/dL (ref 0.20–1.20)
BUN: 9.7 mg/dL (ref 7.0–26.0)
CO2: 25 meq/L (ref 22–29)
CREATININE: 0.7 mg/dL (ref 0.6–1.1)
Calcium: 9.3 mg/dL (ref 8.4–10.4)
Chloride: 108 mEq/L (ref 98–109)
GLUCOSE: 89 mg/dL (ref 70–140)
Potassium: 3.9 mEq/L (ref 3.5–5.1)
SODIUM: 139 meq/L (ref 136–145)
TOTAL PROTEIN: 6.9 g/dL (ref 6.4–8.3)

## 2016-12-03 LAB — IRON AND TIBC
%SAT: 46 % (ref 21–57)
IRON: 139 ug/dL (ref 41–142)
TIBC: 303 ug/dL (ref 236–444)
UIBC: 164 ug/dL (ref 120–384)

## 2016-12-03 LAB — FERRITIN: Ferritin: 57 ng/ml (ref 9–269)

## 2016-12-03 MED ORDER — TAMOXIFEN CITRATE 20 MG PO TABS: 20.0000 mg | ORAL_TABLET | Freq: Every day | ORAL | 3 refills | Status: AC

## 2016-12-03 NOTE — Progress Notes (Signed)
Patient Care Team: Nicholas Lose, MD as PCP - General (Hematology and Oncology)  DIAGNOSIS:  Encounter Diagnoses  Name Primary?  . Malignant neoplasm of lower-outer quadrant of left breast of female, estrogen receptor positive ([place])   . Microcytic anemia Yes    SUMMARY OF ONCOLOGIC HISTORY:   Breast cancer of lower-outer quadrant of left female breast ([place])   06/06/2014 Imaging    Palpable Left breast mass at 12:00 position 1.9 cm, initial biopsy 06/12/2014 revealed fibrocystic changes.      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 The genes tested were ATM, BARD1, BRCA1, BRCA2, BRIP1, CDH1, CHEK2, MRE11A, MUTYH, NBN, NF1, PALB2, PTEN, RAD50, RAD51C, RAD51D, and TP53.      10/04/2014 - 01/20/2015 Chemotherapy    Patient was started on adjuvant chemotherapy with Taxotere, Carboplatin, Herceptin, Perjeta.        02/11/2015 - 03/25/2015 Radiation Therapy    Adjuvant radiation therapy      05/01/2015 -  Anti-estrogen oral therapy    tamoxifen 20 mg daily       CHIEF COMPLIANT: Follow-up on tamoxifen therapy And anemia  INTERVAL HISTORY: [name] is a 31 year old with above-mentioned history of left breast cancer treated with mastectomy followed by adjuvant chemotherapy followed by Herceptin maintenance completed in October 2016. She also received adjuvant radiation and is currently on tamoxifen therapy. She gets hot flashes muscle skeletal  aches and pains. She is also complaining of itching sensation related to the hot flashes. She denies any lumps or nodules.  REVIEW OF SYSTEMS:   Constitutional: Denies fevers, chills or abnormal weight loss Eyes: Denies blurriness of vision Ears, nose, mouth, throat, and face: Denies mucositis or sore throat Respiratory: Denies cough, dyspnea or wheezes Cardiovascular: Denies palpitation, chest discomfort Gastrointestinal:  Denies nausea, heartburn or change in bowel habits Skin: Denies abnormal skin rashes Lymphatics: Denies new lymphadenopathy or easy bruising Neurological:Denies numbness, tingling or new weaknesses Behavioral/Psych: Mood is stable, no new changes  Extremities: No lower extremity edema  All other systems were reviewed with the patient and are negative.  I have reviewed the past medical history, past surgical history, social history and family history with the patient and they are unchanged from previous note.  ALLERGIES:  is allergic to zofran [ondansetron hcl].  MEDICATIONS:  Current Outpatient Prescriptions  Medication Sig Dispense Refill  . Calcium Carbonate (CALCIUM 600 PO) Take by mouth. Reported on 03/24/2016    . IRON PO Take by mouth. Reported on 03/24/2016    . Multiple Vitamin (MULTIVITAMIN) tablet Take 1 tablet by mouth daily.    . tamoxifen (NOLVADEX) 20 MG tablet Take 1 tablet (20 mg total) by mouth daily. 90 tablet 3   No current facility-administered medications for this visit.    Facility-Administered Medications Ordered in Other Visits  Medication Dose Route Frequency Provider Last Rate Last Dose  . sodium chloride 0.9 % injection 10 mL  10 mL Intravenous PRN Nicholas Lose, MD   10 mL  at 12/09/14 1632    PHYSICAL EXAMINATION: ECOG PERFORMANCE STATUS: 1 - Symptomatic but completely ambulatory  Vitals:   12/03/16 1001  BP: (!) 95/51  Pulse: 72  Resp: 18  Temp: 97.7 F (36.5 C)   Filed Weights   12/03/16 1001  Weight: 116 lb 14.4 oz (53  kg)    GENERAL:alert, no distress and comfortable SKIN: skin color, texture, turgor are normal, no rashes or significant lesions EYES: normal, Conjunctiva are pink and non-injected, sclera clear OROPHARYNX:no exudate, no erythema and lips, buccal mucosa, and tongue normal  NECK: supple, thyroid normal size, non-tender, without nodularity LYMPH:  no palpable lymphadenopathy in the cervical, axillary or inguinal LUNGS: clear to auscultation and percussion with normal breathing effort HEART: regular rate & rhythm and no murmurs and no lower extremity edema ABDOMEN:abdomen soft, non-tender and normal bowel sounds MUSCULOSKELETAL:no cyanosis of digits and no clubbing  NEURO: alert & oriented x 3 with fluent speech, no focal motor/sensory deficits EXTREMITIES: No lower extremity edema BREAST: Left mastectomy no palpable lumps or nodules in the chest wall or axilla. No palpable nodules in the right breast. (exam performed in the presence of a chaperone)  LABORATORY DATA:  I have reviewed the data as listed   Chemistry      Component Value Date/Time   NA 139 05/09/2016 1911   NA 143 09/04/2015 1111   K 3.4 (L) 05/09/2016 1911   K 4.2 09/04/2015 1111   CL 108 05/09/2016 1911   CO2 25 05/09/2016 1911   CO2 26 09/04/2015 1111   BUN 12 05/09/2016 1911   BUN 9.3 09/04/2015 1111   CREATININE 0.63 05/09/2016 1911   CREATININE 0.7 09/04/2015 1111      Component Value Date/Time   CALCIUM 9.3 05/09/2016 1911   CALCIUM 9.6 09/04/2015 1111   ALKPHOS 117 05/09/2016 1911   ALKPHOS 73 09/04/2015 1111   AST 20 05/09/2016 1911   AST 12 09/04/2015 1111   ALT 13 (L) 05/09/2016 1911   ALT <9 09/04/2015 1111   BILITOT 0.1 (L) 05/09/2016 1911   BILITOT 0.38 09/04/2015 1111       Lab Results  Component Value Date   WBC 7.9 05/27/2016   HGB 11.3 (L) 05/27/2016   HCT 35.6 05/27/2016   MCV 64.2 (L) 05/27/2016   PLT 206 05/27/2016   NEUTROABS 5.5 05/27/2016    ASSESSMENT & PLAN:  Breast  cancer of lower-outer quadrant of left female breast 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 Positive for lymphovascular invasion, extracapsular tumor extension grade 3 ER 53%, PR 30%, HER-2 positive ratio 5.95, Ki-67 33%, T2, N3, M0 stage IIIc with high-grade DCIS status post left mastectomy 09/06/2014; completed adjuvant chemotherapy 01/20/2015 and completed Herceptin maintenance October 2016 and radiation started 02/11/2015 completed 03/25/2015 by Dr. Isidore Moos  Current treatment: Tamoxifen 20 mg daily started 05/01/2015 10 years Tamoxifen toxicities: 1. Mild nausea: Resolved 2. Heat intolerance 3. Itching sensation when she gets the hot flashes 4. Musculoskeletal aches and pains: Much better  Shingles: Resolved Microcytic anemia: Patient is currently on oral iron. I'm not certain that she is truly iron deficient. I would like to obtain hemoglobin up of pheresis and iron studies along with CBC with differential today. I will call her if she is iron deficient whether she needs to continue with oral iron therapy or she needs something more. If she has been a thalassemia then it  will be observed and does not need to be treated with oral iron.  Return to clinic in 1 year for follow-up   I spent 25 minutes talking to the patient of which more than half was spent in counseling and coordination of care.  Orders Placed This Encounter  Procedures  . Hemoglobinopathy evaluation    Standing Status:   Future    Standing Expiration Date:   01/07/2018  . CBC with Differential/Platelet    Standing Status:   Future    Standing Expiration Date:   01/07/2018  . Iron and TIBC    Standing Status:   Future    Standing Expiration Date:   12/03/2017  . Ferritin    Standing Status:   Future    Standing Expiration Date:   12/03/2017  . Comprehensive metabolic panel    Standing Status:   Future    Standing  Expiration Date:   12/03/2017   The patient has a good understanding of the overall plan. she agrees with it. she will call with any problems that may develop before the next visit here.   Rulon Eisenmenger, MD 12/03/16

## 2016-12-06 LAB — HEMOGLOBINOPATHY EVALUATION
HGB C: 0 %
HGB S: 0 %
HGB VARIANT: 0 %
Hemoglobin A2 Quantitation: 4.6 % — ABNORMAL HIGH (ref 1.8–3.2)
Hemoglobin F Quantitation: 0 % (ref 0.0–2.0)
Hgb A: 95.4 % — ABNORMAL LOW (ref 96.4–98.8)

## 2016-12-17 MED FILL — TAMOXIFEN 20 MG TABLET: 20 | 90 days supply | Qty: 90 | Fill #1

## 2017-01-03 ENCOUNTER — Emergency Department (HOSPITAL_COMMUNITY): Payer: Self-pay

## 2017-01-03 ENCOUNTER — Emergency Department (HOSPITAL_COMMUNITY)
Admission: EM | Admit: 2017-01-03 | Discharge: 2017-01-03 | Disposition: A | Discharge status: Home / Self Care | Payer: Self-pay | Attending: Emergency Medicine | Admitting: Emergency Medicine

## 2017-01-03 ENCOUNTER — Encounter (HOSPITAL_COMMUNITY): Payer: Self-pay | Admitting: Emergency Medicine

## 2017-01-03 DIAGNOSIS — D496 Neoplasm of unspecified behavior of brain: Secondary | ICD-10-CM

## 2017-01-03 DIAGNOSIS — C50512 Malignant neoplasm of lower-outer quadrant of left female breast: Secondary | ICD-10-CM | POA: Diagnosis present

## 2017-01-03 DIAGNOSIS — C7931 Secondary malignant neoplasm of brain: Secondary | ICD-10-CM | POA: Insufficient documentation

## 2017-01-03 DIAGNOSIS — R519 Headache, unspecified: Secondary | ICD-10-CM

## 2017-01-03 DIAGNOSIS — Z170 Estrogen receptor positive status [ER+]: Secondary | ICD-10-CM | POA: Diagnosis present

## 2017-01-03 DIAGNOSIS — R51 Headache: Secondary | ICD-10-CM

## 2017-01-03 DIAGNOSIS — C50919 Malignant neoplasm of unspecified site of unspecified female breast: Secondary | ICD-10-CM

## 2017-01-03 DIAGNOSIS — G9389 Other specified disorders of brain: Secondary | ICD-10-CM

## 2017-01-03 DIAGNOSIS — Z853 Personal history of malignant neoplasm of breast: Secondary | ICD-10-CM | POA: Insufficient documentation

## 2017-01-03 LAB — POC URINE PREG, ED: PREG TEST UR: NEGATIVE

## 2017-01-03 MED ORDER — DEXAMETHASONE SODIUM PHOSPHATE 4 MG/ML IJ SOLN
4.0000 mg | Freq: Four times a day (QID) | INTRAMUSCULAR | Status: DC
  Administered 2017-01-03: 4 mg via INTRAVENOUS
  Filled 2017-01-03: qty 1

## 2017-01-03 MED ORDER — IOPAMIDOL (ISOVUE-300) INJECTION 61%
INTRAVENOUS | Status: AC
  Administered 2017-01-03: 100 mL
  Filled 2017-01-03: qty 100

## 2017-01-03 MED ORDER — ACETAMINOPHEN 500 MG PO TABS: 1000.0000 mg | ORAL_TABLET | Freq: Four times a day (QID) | ORAL | Status: DC | PRN

## 2017-01-03 MED ORDER — DEXAMETHASONE 4 MG PO TABS: 4.0000 mg | ORAL_TABLET | Freq: Four times a day (QID) | ORAL | 0 refills | Status: DC

## 2017-01-03 MED ORDER — PROCHLORPERAZINE EDISYLATE 5 MG/ML IJ SOLN
10.0000 mg | Freq: Once | INTRAMUSCULAR | Status: AC
  Administered 2017-01-03: 10 mg via INTRAVENOUS
  Filled 2017-01-03: qty 2

## 2017-01-03 MED ORDER — SODIUM CHLORIDE 0.9 % IV BOLUS (SEPSIS)
1000.0000 mL | Freq: Once | INTRAVENOUS | Status: AC
  Administered 2017-01-03: 1000 mL via INTRAVENOUS

## 2017-01-03 MED ORDER — TAMOXIFEN CITRATE 10 MG PO TABS
20.0000 mg | ORAL_TABLET | Freq: Every day | ORAL | Status: DC
  Administered 2017-01-03: 20 mg via ORAL
  Filled 2017-01-03: qty 2

## 2017-01-03 MED ORDER — PANTOPRAZOLE SODIUM 40 MG PO TBEC
40.0000 mg | DELAYED_RELEASE_TABLET | Freq: Every day | ORAL | Status: DC
  Administered 2017-01-03: 40 mg via ORAL
  Filled 2017-01-03: qty 1

## 2017-01-03 MED ORDER — DEXAMETHASONE SODIUM PHOSPHATE 10 MG/ML IJ SOLN
10.0000 mg | Freq: Once | INTRAMUSCULAR | Status: AC
  Administered 2017-01-03: 10 mg via INTRAVENOUS
  Filled 2017-01-03: qty 1

## 2017-01-03 MED ORDER — OMEPRAZOLE 20 MG PO CPDR: 20.0000 mg | DELAYED_RELEASE_CAPSULE | Freq: Two times a day (BID) | ORAL | 0 refills | Status: AC

## 2017-01-03 MED ORDER — IBUPROFEN 400 MG PO TABS: 400.0000 mg | ORAL_TABLET | Freq: Four times a day (QID) | ORAL | Status: DC | PRN

## 2017-01-03 MED ORDER — DIPHENHYDRAMINE HCL 50 MG/ML IJ SOLN
25.0000 mg | Freq: Once | INTRAMUSCULAR | Status: AC
  Administered 2017-01-03: 25 mg via INTRAVENOUS
  Filled 2017-01-03: qty 1

## 2017-01-03 MED ORDER — DIPHENHYDRAMINE HCL 25 MG PO TABS
50.0000 mg | ORAL_TABLET | Freq: Four times a day (QID) | ORAL | Status: DC | PRN
  Filled 2017-01-03: qty 2

## 2017-01-03 MED ORDER — GADOBENATE DIMEGLUMINE 529 MG/ML IV SOLN
10.0000 mL | Freq: Once | INTRAVENOUS | Status: AC | PRN
  Administered 2017-01-03: 10 mL via INTRAVENOUS

## 2017-01-03 NOTE — ED Notes (Signed)
Care handoff to Melanie, RN 

## 2017-01-03 NOTE — ED Notes (Signed)
PT discharged by [employer], RN. This nurse walks PT and family to [facility]. PT pushed in wheelchair by husband

## 2017-01-03 NOTE — H&P (Signed)
CC:  Chief Complaint  Patient presents with  . Headache    HPI: [name] is a 31 y.o. female who presented to ER for HA x several days. Started abruptly. No injury. Pain encompasses entire head. Pain does not go beyond the occiput. Describes it as a sharp pain. S/P migraine cocktail pain 5/10. Prior to cocktail, pain 8/10. She has been taking OTC meds without relief. Currently on Tamoxifen for history of breast cancer. Went through chemo/radiation x 1 year and has been on Tamoxifen x1 year. Denies dizziness, changes in vision, gait disturbances, balance issues, slurring speech, one sided weakness.   History gathered using interpreter [place] [phone].  PMH: Past Medical History:  Diagnosis Date  . Anemia    takes iron supplement  . Broken tooth 12/01/2015   left upper back  . History of breast cancer 2015   left  . History of chemotherapy    finished 07/2015  . Multiple blisters 11/2015   tongue - states from chemo    PSH: Past Surgical History:  Procedure Laterality Date  . BREAST LUMPECTOMY Left 07/10/2014   Procedure: LEFT BREAST LUMPECTOMY;  Surgeon: Merrie Roof, MD;  Location: [place];  Service: General;  Laterality: Left;  . CESAREAN SECTION  09/09/2007  . MASTECTOMY W/ SENTINEL NODE BIOPSY Left 09/06/2014   Procedure: LEFT MASTECTOMY WITH SENTINEL LYMPH NODE BIOPSY/NODE MAPPING;  Surgeon: Autumn Messing III, MD;  Location: [place];  Service: General;  Laterality: Left;  . PORT-A-CATH REMOVAL N/A 12/08/2015   Procedure: REMOVAL PORT-A-CATH;  Surgeon: Autumn Messing III, MD;  Location: [place];  Service: General;  Laterality: N/A;  . PORTACATH PLACEMENT Right 09/06/2014   Procedure: INSERTION PORT-A-CATH;  Surgeon: Autumn Messing III, MD;  Location: [place];  Service: General;  Laterality: Right;    SH: Social History  Substance Use Topics  . Smoking status: Never Smoker  . Smokeless tobacco: Never Used  . Alcohol use No     MEDS: Prior to Admission medications   Medication Sig Start Date End Date Taking? Authorizing Provider  acetaminophen (TYLENOL) 500 MG tablet Take 1,000 mg by mouth every 6 (six) hours as needed for headache.   Yes Historical Provider, MD  diphenhydrAMINE (BENADRYL) 25 MG tablet Take 50 mg by mouth every 6 (six) hours as needed for itching.   Yes Historical Provider, MD  ibuprofen (ADVIL,MOTRIN) 200 MG tablet Take 400 mg by mouth every 6 (six) hours as needed for headache.   Yes Historical Provider, MD  tamoxifen (NOLVADEX) 20 MG tablet Take 1 tablet (20 mg total) by mouth daily. 12/03/16  Yes Nicholas Lose, MD    ALLERGY: Allergies  Allergen Reactions  . Zofran [Ondansetron Hcl] Itching    ROS: Review of Systems  Constitutional: Positive for malaise/fatigue. Negative for chills, diaphoresis, fever and weight loss.  HENT: Negative.   Eyes: Negative.   Respiratory: Negative.   Cardiovascular: Negative.   Gastrointestinal: Negative.   Genitourinary: Negative.   Musculoskeletal: Negative.   Skin: Positive for itching (chronic). Negative for rash.  Neurological: Negative.  Negative for weakness.  Endo/Heme/Allergies: Negative.   Psychiatric/Behavioral: Negative.     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 Moderate vasogenic edema and fourth ventricular narrowing. When compared to 2016 head CT there has been a mild increase in lateral ventricular volume.  IMPRESSION: - 31 y.o. 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 She is  neurologically intact and without deficits.  PLAN: - Will admit under neurosurgery. Will check MRI brain, CT abd/pelvis & CT chest to further eval the extent of her disease. - Further recs pending imaging.  - Discussed with Dr Ditty    

## 2017-01-03 NOTE — ED Provider Notes (Signed)
Addendum:  I again have spoken with Dr. Cyndy Freeze of neurosurgery. Patient's MRI seen by neurosurgery and reviewed. Has meningeal carcinomatosis. Not a candidate for surgery. We discharged on Decadron, to follow up with radiation oncology. Dr. Cyndy Freeze has spoken with Dr. Tammi Klippel who will contact the patient to establish first visit.   Tanna Furry, MD 01/03/17 [phone]

## 2017-01-03 NOTE — ED Provider Notes (Signed)
Pt seen on arrival. History of prior breast Ca, on Tamoxifen. Progressive HA for 7 days. 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 NS services had contacted me prior to pt arrival. NS PA here seeing patient.   Tanna Furry, MD 01/03/17 1105

## 2017-01-03 NOTE — ED Notes (Signed)
Hooked patient up to the monitor patient is resting 

## 2017-01-03 NOTE — ED Provider Notes (Signed)
[facility] DEPT Provider Note   CSN: [id number] Arrival date & time: 01/03/17  0759     History   Chief Complaint Chief Complaint  Patient presents with  . Headache    HPI [name] is a 31 y.o. female.  HPI   Presents with headache for over one week, began slowly and worsened. Reports headache is severe, 8 out of 10. Has not had a history of prior headaches. It's worse with bright lights. Denies numbness, weakness, nausea, vomiting, visual changes. She is on tamoxifen for breast cancer. Tried ibuprofen at home without relief.   Past Medical History:  Diagnosis Date  . Anemia    takes iron supplement  . Broken tooth 12/01/2015   left upper back  . History of breast cancer 2015   left  . History of chemotherapy    finished 07/2015  . Multiple blisters 11/2015   tongue - states from chemo    Patient Active Problem List   Diagnosis Date Noted  . Shingles 05/27/2016  . Genetic testing 09/22/2015  . Lymphedema 07/06/2015  . Arm pain 07/06/2015  . Heat intolerance 07/06/2015  . 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 Vaginal irritation 02/14/2015  . Breast cancer of lower-outer quadrant of left female breast ([place]) 07/26/2014    Past Surgical History:  Procedure Laterality Date  . BREAST LUMPECTOMY Left 07/10/2014   Procedure: LEFT BREAST LUMPECTOMY;  Surgeon: Merrie Roof, MD;  Location: [facility];  Service: General;  Laterality: Left;  . CESAREAN SECTION  09/09/2007  . MASTECTOMY W/ SENTINEL NODE BIOPSY Left 09/06/2014   Procedure: LEFT MASTECTOMY WITH SENTINEL LYMPH NODE BIOPSY/NODE MAPPING;  Surgeon: Autumn Messing III, MD;  Location: [facility];  Service: General;  Laterality: Left;  . PORT-A-CATH REMOVAL N/A 12/08/2015   Procedure: REMOVAL PORT-A-CATH;  Surgeon: Autumn Messing III, MD;  Location: [facility];  Service: General;  Laterality: N/A;  . PORTACATH PLACEMENT Right 09/06/2014   Procedure: INSERTION PORT-A-CATH;  Surgeon:  Autumn Messing III, MD;  Location: [facility];  Service: General;  Laterality: Right;    OB History    Gravida Para Term Preterm AB Living   2 2 2     2    SAB TAB Ectopic Multiple Live Births           1       Home Medications    Prior to Admission medications   Medication Sig Start Date End Date Taking? Authorizing Provider  acetaminophen (TYLENOL) 500 MG tablet Take 1,000 mg by mouth every 6 (six) hours as needed for headache.   Yes Historical Provider, MD  diphenhydrAMINE (BENADRYL) 25 MG tablet Take 50 mg by mouth every 6 (six) hours as needed for itching.   Yes Historical Provider, MD  ibuprofen (ADVIL,MOTRIN) 200 MG tablet Take 400 mg by mouth every 6 (six) hours as needed for headache.   Yes Historical Provider, MD  tamoxifen (NOLVADEX) 20 MG tablet Take 1 tablet (20 mg total) by mouth daily. 12/03/16  Yes Nicholas Lose, MD    Family History Family History  Problem Relation Age of Onset  . Diabetes Mother   . Hypertension Mother   . Diabetes Maternal Grandmother   . Hypertension Maternal Grandmother   . Stomach cancer Maternal Grandmother   . Stomach cancer Maternal Grandfather     Social History Social History  Substance Use Topics  . Smoking status: Never Smoker  . Smokeless tobacco: Never Used  . Alcohol use No  Allergies   Zofran [ondansetron hcl]   Review of Systems Review of Systems  Constitutional: Negative for fever.  HENT: Negative for sore throat.   Eyes: Negative for visual disturbance.  Respiratory: Negative for cough and shortness of breath.   Cardiovascular: Negative for chest pain.  Gastrointestinal: Negative for abdominal pain, nausea and vomiting.  Genitourinary: Negative for difficulty urinating.  Musculoskeletal: Negative for back pain and neck pain.  Skin: Positive for rash.  Neurological: Positive for headaches. Negative for dizziness, syncope, facial asymmetry, weakness and numbness.     Physical Exam Updated Vital Signs BP 97/61 (BP  Location: Right Arm)   Pulse 63   Temp 97.9 F (36.6 C) (Oral)   Resp 18   SpO2 95%   Physical Exam  Constitutional: She is oriented to person, place, and time. She appears well-developed and well-nourished. No distress.  HENT:  Head: Normocephalic and atraumatic.  Eyes: Conjunctivae and EOM are normal.  Neck: Normal range of motion.  Cardiovascular: Normal rate, regular rhythm, normal heart sounds and intact distal pulses.  Exam reveals no gallop and no friction rub.   No murmur heard. Pulmonary/Chest: Effort normal and breath sounds normal. No respiratory distress. She has no wheezes. She has no rales.  Abdominal: Soft. She exhibits no distension. There is no tenderness. There is no guarding.  Musculoskeletal: She exhibits no edema or tenderness.  Neurological: She is alert and oriented to person, place, and time. She has normal strength. No cranial nerve deficit or sensory deficit. Coordination normal. GCS eye subscore is 4. GCS verbal subscore is 5. GCS motor subscore is 6.  Skin: Skin is warm and dry. No rash noted. She is not diaphoretic. No erythema.  Nursing note and vitals reviewed.    ED Treatments / Results  Labs (all labs ordered are listed, but only abnormal results are displayed) Labs Reviewed  POC URINE PREG, ED    EKG  EKG Interpretation None       Radiology Ct Head Wo Contrast  Result Date: 01/03/2017 CLINICAL DATA:  Headache for over a week. EXAM: CT HEAD WITHOUT CONTRAST TECHNIQUE: Contiguous axial images were obtained from the base of the skull through the vertex without intravenous contrast. COMPARISON:  03/11/2015 FINDINGS: Brain: Isodense hyperdense mass in the inferior right cerebellum measuring 31 x 19 x 19 mm. Moderate vasogenic edema and distortion of the fourth ventricle. Subtle but definite increase in lateral ventricular volume compared to 2016, especially when compared at the temporal horns. No second mass is seen. No acute or remote infarct. No  acute hemorrhage. Vascular: No hyperdense vessel or unexpected calcification. Skull: Normal. Negative for fracture or focal lesion. Sinuses/Orbits: Negative Other: These results were called by telephone at the time of interpretation on 01/03/2017 at 8:52 am to Dr. Gareth Morgan , who verbally acknowledged these results. IMPRESSION: 3 x 2 x 2 cm right cerebellar mass, likely a breast cancer metastasis. Moderate vasogenic edema and fourth ventricular narrowing. When compared to 2016 head CT there has been a mild increase in lateral ventricular volume. Electronically Signed   By: Monte Fantasia M.D.   On: 01/03/2017 08:56    Procedures Procedures (including critical care time)  Medications Ordered in ED Medications  sodium chloride 0.9 % bolus 1,000 mL (1,000 mLs Intravenous New Bag/Given 01/03/17 0852)  dexamethasone (DECADRON) injection 10 mg (10 mg Intravenous Given 01/03/17 0853)  prochlorperazine (COMPAZINE) injection 10 mg (10 mg Intravenous Given 01/03/17 0853)  diphenhydrAMINE (BENADRYL) injection 25 mg (25 mg Intravenous Given  01/03/17 [id number])     Initial Impression / Assessment and Plan / ED Course  I have reviewed the triage vital signs and the nursing notes.  Pertinent labs & imaging results that were available during my care of the patient were reviewed by me and considered in my medical decision making (see chart for details).     31 year old female with history of breast cancer presents with concern for headache of one week. CT head performed shows cerebellar mass. Patient awake, neurologically intact with headache.  Headache improved with decadron, compazine, benadryl in ED.  Discussed concerns with family regarding likely metastatic breast cancer.  Discussed with [facility], Dr. Cyndy Freeze aware of patient.  Recommend transfer [facility], MRI [facility].     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

## 2017-01-03 NOTE — ED Notes (Signed)
Patient transported to CT 

## 2017-01-03 NOTE — ED Notes (Signed)
Pt has returned from MRI. 

## 2017-01-03 NOTE — ED Notes (Signed)
Pt taken to MRI at this time

## 2017-01-03 NOTE — ED Notes (Signed)
Pt has finished drinking her second bottle of oral contrast for CT. CT informed.

## 2017-01-03 NOTE — ED Triage Notes (Signed)
Per pt, states headache for over a week-OTC meds not working-history of cancer, doesn't know if med causing headache

## 2017-01-03 NOTE — ED Notes (Signed)
PT arrives to [facility] via [facility] for evaluation of a new mass in cerebellum. PT alert and oriented at this time. PT being placed on cardiac monitor.

## 2017-01-03 NOTE — Discharge Instructions (Signed)
Prescription for Decadron(steroid) to reduce swelling.  Prilosec to protect stomach from ulcers (decadron can cause gastritis, ulcers) .  Dr. Tammi Klippel will contact you to schedule radiation therapy.

## 2017-01-04 ENCOUNTER — Ambulatory Visit (HOSPITAL_BASED_OUTPATIENT_CLINIC_OR_DEPARTMENT_OTHER): Payer: Self-pay | Admitting: Hematology and Oncology

## 2017-01-04 ENCOUNTER — Telehealth: Payer: Self-pay

## 2017-01-04 ENCOUNTER — Other Ambulatory Visit: Payer: Self-pay | Admitting: Hematology and Oncology

## 2017-01-04 ENCOUNTER — Encounter: Payer: Self-pay | Admitting: Hematology and Oncology

## 2017-01-04 VITALS — BP 100/88 | HR 84 | Temp 98.0°F | Resp 17 | Wt 116.0 lb

## 2017-01-04 DIAGNOSIS — C7931 Secondary malignant neoplasm of brain: Secondary | ICD-10-CM | POA: Insufficient documentation

## 2017-01-04 DIAGNOSIS — R51 Headache: Secondary | ICD-10-CM

## 2017-01-04 DIAGNOSIS — Z170 Estrogen receptor positive status [ER+]: Secondary | ICD-10-CM

## 2017-01-04 DIAGNOSIS — C50512 Malignant neoplasm of lower-outer quadrant of left female breast: Secondary | ICD-10-CM

## 2017-01-04 DIAGNOSIS — Z7189 Other specified counseling: Secondary | ICD-10-CM | POA: Insufficient documentation

## 2017-01-04 SURGERY — CRANIECTOMY POSTERIOR FOSSA DECOMPRESSION
Anesthesia: General | Laterality: Right

## 2017-01-04 MED ORDER — LORAZEPAM 0.5 MG PO TABS: 0.5000 mg | ORAL_TABLET | Freq: Three times a day (TID) | ORAL | 0 refills | Status: AC

## 2017-01-04 MED ORDER — CYTARABINE LIPOSOME CHEMO INTRATHECAL 50 MG/5ML
50.0000 mg | Freq: Once | INTRATHECAL | Status: DC
Start: 2017-01-05 — End: 2017-01-04

## 2017-01-04 MED FILL — LORazepam 0.5 MG TABS: 0.5 | 10 days supply | Qty: 30 | Fill #0

## 2017-01-04 NOTE — Progress Notes (Signed)
START ON PATHWAY REGIMEN - Breast  BOS211: Ovarian Suppression Followed by Tamoxifen 20 mg Daily    Daily:     Tamoxifen (Nolvadex(R)) 20 mg orally daily       Dose Mod: None         Additional Orders: Avoid strong CYP2D6 inhibitors  CYP2D6 inhibitors:  Strong inhibitors: fluoxetine, paroxetine  Moderate inhibitors: sertraline  Weak inhibitors: venlafaxine, citalopram  **Always confirm dose/schedule in your pharmacy ordering system**    Clinician Notes: Depo Cytarabine Intrathecal injection Lapatinib Plus Tamoxifen  Patient Characteristics: Metastatic Hormonal, Premenopausal, First Line AJCC Stage Grouping: IV Current Disease Status: Distant Metastases AJCC M Stage: X ER Status: Positive (+) AJCC N Stage: X AJCC T Stage: X HER2/neu: Positive (+) PR Status: Positive (+) Menopausal Status? Premenopausal Line of therapy: First Line Would you be surprised if this patient died  in the next year? I would be surprised if this patient died in the next year  Intent of Therapy: Non-Curative / Palliative Intent, Discussed with Patient

## 2017-01-04 NOTE — Progress Notes (Signed)
Patient Care Team: Nicholas Lose, MD as PCP - General (Hematology and Oncology)  DIAGNOSIS:  Encounter Diagnoses  Name Primary?  . Brain metastases ([place])   . Malignant neoplasm of lower-outer quadrant of left breast of female, estrogen receptor positive ([place]) Yes    SUMMARY OF ONCOLOGIC HISTORY:   Breast cancer of lower-outer quadrant of left female breast ([place])   06/06/2014 Imaging    Palpable Left breast mass at 12:00 position 1.9 cm, initial biopsy 06/12/2014 revealed fibrocystic changes.      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 The genes tested were ATM, BARD1, BRCA1, BRCA2, BRIP1, CDH1, CHEK2, MRE11A, MUTYH, NBN, NF1, PALB2, PTEN, RAD50, RAD51C, RAD51D, and TP53.      10/04/2014 - 01/20/2015 Chemotherapy    Patient was started on adjuvant chemotherapy with Taxotere, Carboplatin, Herceptin, Perjeta followed by Herceptin maintenance for 1 year.        02/11/2015 - 03/25/2015 Radiation Therapy    Adjuvant radiation therapy      05/01/2015 -  Anti-estrogen oral therapy    tamoxifen 20 mg daily      01/03/2017 Imaging    Brain MRI: Intra-axial mass right inf cerebellum 33 mm, mass left sup cerebellum 25 mm, leptomeningeal carcinomatosis of cerebellum, left trigeminal nerve involvement, mass effect related to the right inferior cerebellar metastases slight right-to-left shift, 6 mm metastases and foramina of Magendie, punctate focus right frontal  subcortical white matter       CHIEF COMPLIANT: Follow-up after recent brain MRI showing metastatic disease  INTERVAL HISTORY: [name] is a 31 year old with above-mentioned history of left breast cancer treated with mastectomy followed by adjuvant chemotherapy with [place] followed by Herceptin maintenance. She had been on tamoxifen therapy and developed intractable headaches which led her to the emergency room. 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 Neurosurgery Ditty was consulted he reviewed the images and determined that she is not a candidate for surgery. She was supposed see Dr. Isidore Moos with radiation oncology. She is here today to discuss these findings and determine her overall treatment plan. Patient continues to have recurrent headaches.Marland Kitchen   REVIEW OF SYSTEMS:   Constitutional: Denies fevers, chills or abnormal weight loss Eyes: Denies blurriness of vision Ears, nose, mouth, throat, and face: Denies mucositis or sore throat Respiratory: Denies cough, dyspnea or wheezes Cardiovascular: Denies palpitation, chest discomfort Gastrointestinal:  Denies nausea, heartburn or change in bowel habits Skin: Denies abnormal skin rashes Lymphatics: Denies new lymphadenopathy or easy bruising Neurological:severe headaches behavioral/Psych: Mood is stable, no new changes  Extremities: No lower extremity edema All other systems were reviewed with the patient and are negative.  I have reviewed the past medical history, past surgical history, social history and family history with the patient and they are unchanged from previous note.  ALLERGIES:  is allergic to zofran [ondansetron hcl].  MEDICATIONS:  Current Outpatient Prescriptions  Medication Sig  Dispense Refill  . acetaminophen (TYLENOL) 500 MG tablet Take 1,000 mg by mouth every 6 (six) hours as needed for headache.    .  dexamethasone (DECADRON) 4 MG tablet Take 1 tablet (4 mg total) by mouth 4 (four) times daily. 60 tablet 0  . diphenhydrAMINE (BENADRYL) 25 MG tablet Take 50 mg by mouth every 6 (six) hours as needed for itching.    Marland Kitchen ibuprofen (ADVIL,MOTRIN) 200 MG tablet Take 400 mg by mouth every 6 (six) hours as needed for headache.    Marland Kitchen LORazepam (ATIVAN) 0.5 MG tablet Take 1 tablet (0.5 mg total) by mouth every 8 (eight) hours. 30 tablet 0  . omeprazole (PRILOSEC) 20 MG capsule Take 1 capsule (20 mg total) by mouth 2 (two) times daily. 60 capsule 0  . tamoxifen (NOLVADEX) 20 MG tablet Take 1 tablet (20 mg total) by mouth daily. 90 tablet 3   No current facility-administered medications for this visit.    Facility-Administered Medications Ordered in Other Visits  Medication Dose Route Frequency Provider Last Rate Last Dose  . sodium chloride 0.9 % injection 10 mL  10 mL Intravenous PRN Nicholas Lose, MD   10 mL at 12/09/14 1632    PHYSICAL EXAMINATION: ECOG PERFORMANCE STATUS: 1 - Symptomatic but completely ambulatory  Vitals:   01/04/17 1219  BP: 100/88  Pulse: 84  Resp: 17  Temp: 98 F (36.7 C)   Filed Weights   01/04/17 1219  Weight: 116 lb (52.6 kg)    GENERAL:alert, no distress and comfortable SKIN: skin color, texture, turgor are normal, no rashes or significant lesions EYES: normal, Conjunctiva are pink and non-injected, sclera clear OROPHARYNX:no exudate, no erythema and lips, buccal mucosa, and tongue normal  NECK: supple, thyroid normal size, non-tender, without nodularity LYMPH:  no palpable lymphadenopathy in the cervical, axillary or inguinal LUNGS: clear to auscultation and percussion with normal breathing effort HEART: regular rate & rhythm and no murmurs and no lower extremity edema ABDOMEN:abdomen soft, non-tender and normal bowel sounds MUSCULOSKELETAL:no cyanosis of digits and no clubbing  NEURO: alert & oriented x 3 with fluent speech, no focal motor/sensory deficits,  headaches and neck pain EXTREMITIES: No lower extremity edema  LABORATORY DATA:  I have reviewed the data as listed   Chemistry      Component Value Date/Time   NA 139 12/03/2016 1050   K 3.9 12/03/2016 1050   CL 108 05/09/2016 1911   CO2 25 12/03/2016 1050   BUN 9.7 12/03/2016 1050   CREATININE 0.7 12/03/2016 1050      Component Value Date/Time   CALCIUM 9.3 12/03/2016 1050   ALKPHOS 96 12/03/2016 1050   AST 15 12/03/2016 1050   ALT 10 12/03/2016 1050   BILITOT 0.46 12/03/2016 1050       Lab Results  Component Value Date   WBC 6.5 12/03/2016   HGB 11.2 (L) 12/03/2016   HCT 35.6 12/03/2016   MCV 64.0 (L) 12/03/2016   PLT 231 12/03/2016   NEUTROABS 3.8 12/03/2016    ASSESSMENT & PLAN:  Breast cancer of lower-outer quadrant of left female breast (H[facility]) Left breast invasive ductal carcinoma multifocal disease with multiple nodules ranging from 0.3 cm up to 3.2 cm and 17/20 lymph nodes positive. Positive for lymphovascular invasion, extracapsular tumor extension grade 3 ER 53%, PR 30%, HER-2 positive ratio 5.95, Ki-67 33%, T2, N3, M0 stage IIIc with high-grade DCIS status post left mastectomy 09/06/2014; completed adjuvant chemotherapy 01/20/2015 and completed Herceptin maintenance October 2016 and radiation  started 02/11/2015 completed 03/25/2015 by Dr. Isidore Moos. She is on tamoxifen 20 mg daily.  Brain MRI 01/03/2017: Intra-axial mass right inf cerebellum 33 mm, mass left sup cerebellum 25 mm, leptomeningeal carcinomatosis of cerebellum, left trigeminal nerve involvement, mass effect related to the right inferior cerebellar metastases slight right-to-left shift, 6 mm metastases and foramina of Magendie, punctate focus right frontal subcortical white matter  Radiology review: I discussed the case extensively with the Dr. Cyndy Freeze. She is not a candidate for neurosurgery. Unfortunately even a spinal tap can lead to major complications including a 10-20% risk of impingement of the cord.  Because of these reasons, we elected not to pursue a lumbar puncture.  CT CAP 01/03/2017: No evidence of distant metastatic disease beyond the CNS  Prognosis: I discussed with the patient that she has extreme report or masses based on the extent of metastatic disease into the central nervous system. I recommended starting the patient on lapatinib in addition to the tamoxifen. Lapatinib has higher CNS penetration.  Ideally I would've preferred to do intrathecal chemotherapy but because of risks of cord compression, we decided not to do it.  Plan: 1. Palliative radiation therapy 2. Zoladex with letrozole and lapatinib palliative treatment. 3. Continue dexamethasone 4 mg 4 times a day until radiation is complete 4. Followed by intrathecal depot cytarabine   Goal of treatment: Palliation I discussed with the patient that she has poor prognosis and that the median survival with carcinomatous meningitis could be less than 6 months. They requested a second opinion consultation and I recommended that the patient see Dr. Wetzel Bjornstad at [facility].   I spent 25 minutes talking to the patient of which more than half was spent in counseling and coordination of care.  No orders of the defined types were placed in this encounter.  The patient has a good understanding of the overall plan. she agrees with it. she will call with any problems that may develop before the next visit here.   Rulon Eisenmenger, MD 01/04/17

## 2017-01-04 NOTE — Telephone Encounter (Signed)
Called pt to see if she is able to come in today to see Dr.Gudena. Pt was in ED yesterday. Pt states that her head is hurting and is on decadron. Pt is scheduled at 12:30 to see MD today.

## 2017-01-04 NOTE — Progress Notes (Signed)
Location/Histology of Brain Tumor: 01/03/17 MRI Head IMPRESSION: 1. Intra-axial mass within the right inferior cerebellar hemisphere measuring up to 33 mm and second enhancing mass within the left superior cerebellar hemisphere measuring up to 25 mm likely representing metastatic disease. 2. Leptomeningeal carcinomatosis of the cerebellum. 3. Punctate focus of enhancement of left trigeminal nerve, probably related to carcinomatosis. 4. Mass effect related to the right inferior cerebellar metastasis results in slight right to left midline shift of the posterior fossa, asymmetric 7 mm inferior tonsillar herniation on the right, and partial effacement of the fourth ventricle. 5. 6 mm metastasis at the foramina of Magendie. Combined with mass effect from the right cerebellar hemisphere mass likely effacing the right-sided foramen of Luschka, there is potential risk for developing hydrocephalus. No hydrocephalus at this time. 6. Punctate focus of enhancement in right frontal subcortical white matter of uncertain significance, possibly an additional metastasis. Careful attention at follow-up recommended. 7. Maxillary sinus paranasal sinus disease.  Patient presented with symptoms of:  Severe headaches that presented suddently, she presented to the ED on 01/03/17 and had a MRI done.   Past or anticipated interventions, if any, per neurosurgery: Not a surgical canidate  Past or anticipated interventions, if any, per medical oncology:  Dr. Lindi Adie 01/04/17 Plan: 1. Palliative radiation therapy 2. Zoladex with letrozole and lapatinib palliative treatment. 3. Continue dexamethasone 4 mg 4 times a day until radiation is complete 4. Followed by intrathecal depot cytarabine   Goal of treatment: Palliation I discussed with the patient that she has poor prognosis and that the median survival with carcinomatous meningitis could be less than 6 months. They requested a second opinion consultation and I  recommended that the patient see Dr. Wetzel Bjornstad at [facility].  Dose of Decadron, if applicable: 4 mg four times daily.   Recent neurologic symptoms, if any:   Seizures: No  Headaches: Yes  Nausea: No  Dizziness/ataxia: No   Difficulty with hand coordination: No  Focal numbness/weakness: No  Visual deficits/changes: No  Confusion/Memory deficits: No  Painful bone metastases at present, if any: No  SAFETY ISSUES: Prior radiation? Yes, Radiation treatment dates:   02/11/2015-03/25/2015 Site/dose:   1) Left Chest Wall and internal mammary nodes / 50 Gy in 25 fractions 2) Left Supraclavicular fossa / 50 Gy in 25 fractions 3) Left Posterior Axillary boost / 5.075 Gy in 25 fractions (Mid axilla receiving total dose of 50 Gy with boost) 4) Left Chest Wall Scar boost / 10 Gy in 5 fractions  Pacemaker/ICD? No  Possible current pregnancy? No, She is using condoms for birth control. She had a pregnancy test on 01/03/17 that was negative  Is the patient on methotrexate? No  Additional Complaints / other details:  CT CAP 01/03/2017: No evidence of distant metastatic disease beyond the CNS  BP 101/61   Pulse 92   Temp 98.4 F (36.9 C)   Ht 5\' 2"  (1.575 m)   Wt 115 lb 12.8 oz (52.5 kg)   SpO2 100% Comment: room air  BMI 21.18 kg/m    Wt Readings from Last 3 Encounters:  01/05/17 115 lb 12.8 oz (52.5 kg)  01/04/17 116 lb (52.6 kg)  12/03/16 116 lb 14.4 oz (53 kg)

## 2017-01-04 NOTE — Assessment & Plan Note (Signed)
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 Positive for lymphovascular invasion, extracapsular tumor extension grade 3 ER 53%, PR 30%, HER-2 positive ratio 5.95, Ki-67 33%, T2, N3, M0 stage IIIc with high-grade DCIS status post left mastectomy 09/06/2014; completed adjuvant chemotherapy 01/20/2015 and completed Herceptin maintenance October 2016 and radiation started 02/11/2015 completed 03/25/2015 by Dr. Isidore Moos  Brain MRI 01/03/2017: Intra-axial mass right inf cerebellum 33 mm, mass left sup cerebellum 25 mm, leptomeningeal carcinomatosis of cerebellum, left trigeminal nerve involvement, mass effect related to the right inferior cerebellar metastases slight right-to-left shift, 6 mm metastases and foramina of [place], punctate focus right frontal subcortical white matter  Radiology review: I discussed the case extensively with the Dr. Cyndy Freeze. She is not a candidate for surgery. Unfortunately even a spinal tap can lead to major complications including a 10-20% risk of impingement of the cord. Because of these reasons, we elected not to pursue a lumbar puncture.  CT CAP: No evidence of distant metastatic disease beyond the CNS  Prognosis: I discussed with the patient that she has extreme report or masses based on the extent of metastatic disease into the central nervous system. I recommended starting the patient on lapatinib in addition to the tamoxifen. Lapatinib has higher CNS penetration.  Ideally I would've preferred to do intrathecal chemotherapy but because of risks of cardiac compression, we decided not to do it.  Plan: 1. Palliative radiation therapy 2. Faslodex with lapatinib palliative treatment. 3. Continue dexamethasone  Goal of treatment: Palliation

## 2017-01-05 ENCOUNTER — Encounter: Payer: Self-pay | Admitting: Radiation Oncology

## 2017-01-05 ENCOUNTER — Encounter: Payer: Self-pay | Admitting: *Deleted

## 2017-01-05 ENCOUNTER — Ambulatory Visit
Admission: RE | Admit: 2017-01-05 | Discharge: 2017-01-05 | Disposition: A | Discharge status: Home / Self Care | Payer: Self-pay | Source: Ambulatory Visit | Attending: Radiation Oncology | Admitting: Radiation Oncology

## 2017-01-05 ENCOUNTER — Telehealth: Payer: Self-pay

## 2017-01-05 VITALS — BP 101/61 | HR 92 | Temp 98.4°F | Ht 62.0 in | Wt 115.8 lb

## 2017-01-05 DIAGNOSIS — Z79899 Other long term (current) drug therapy: Secondary | ICD-10-CM | POA: Insufficient documentation

## 2017-01-05 DIAGNOSIS — Z853 Personal history of malignant neoplasm of breast: Secondary | ICD-10-CM | POA: Insufficient documentation

## 2017-01-05 DIAGNOSIS — Z923 Personal history of irradiation: Secondary | ICD-10-CM | POA: Insufficient documentation

## 2017-01-05 DIAGNOSIS — C7931 Secondary malignant neoplasm of brain: Secondary | ICD-10-CM

## 2017-01-05 DIAGNOSIS — Z888 Allergy status to other drugs, medicaments and biological substances status: Secondary | ICD-10-CM | POA: Insufficient documentation

## 2017-01-05 DIAGNOSIS — Z510 Encounter for antineoplastic radiation therapy: Secondary | ICD-10-CM | POA: Insufficient documentation

## 2017-01-05 NOTE — Telephone Encounter (Signed)
Erin RN from [facility] called requesting Dr Lindi Adie most recent [place] note and surgical note from Dr Marlou Starks in Aug 2015. Notes faxed to [facility].

## 2017-01-05 NOTE — Progress Notes (Signed)
  SIMULATION AND TREATMENT PLANNING NOTE  Outpatient  DIAGNOSIS:     ICD-9-CM ICD-10-CM   1. Brain metastases ([place]) 198.3 C79.31     NARRATIVE:  The patient was brought to the [facility].  Identity was confirmed.  All relevant records and images related to the planned course of therapy were reviewed.  The patient freely provided informed written consent to proceed with treatment after reviewing the details related to the planned course of therapy. The consent form was witnessed and verified by the simulation staff.    Then, the patient was set-up in a stable reproducible  supine position for radiation therapy.  An Aquaplast facemask was custom fitted to the patient's anatomy.  CT images were obtained.  Surface markings were placed.  The CT images were loaded into the planning software.      TREATMENT PLANNING NOTE: Treatment planning then occurred.  The radiation prescription was entered and confirmed.    A total of 3 medically necessary complex treatment devices were fabricated and supervised by me, in the form of an Aquaplast mask and 2 fields with MLCs to block globes, pharynx, spinal cord, lenses. Wedges will be used as needed for dose homogeneity.  MORE FIELDS WITH MLCs MAY BE ADDED IN DOSIMETRY for dose homogeneity.  I have requested : Isodose Plan.     The patient will receive 30 Gy in 10 fractions.  -----------------------------------  Eppie Gibson, MD

## 2017-01-05 NOTE — Progress Notes (Signed)
Radiation Oncology         [phone] ________________________________  Name: [name] MRN: [id number]  Date: 01/05/2017  DOB: [birth year]  Re-consultation Visit Note  outpatient  CC: Rulon Eisenmenger, MD  Nicholas Lose, MD  Diagnosis and Prior Radiotherapy:    ICD-9-CM ICD-10-CM   1. 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 The patient was last seen in our clinic for follow up on 04/25/2015. Since that time, the patient presented to the ED on 01/03/2017 complaining of severe headache. Accordingly, an MRI Head was performed on 01/03/2017 revealing an intra-axial mass within the right inferior cerebellar hemisphere measuring up to 33 mm and a second enhancing mass within the left superior cerebellar hemisphere measuring up to 25 mm likely representing metastatic disease. There was leptomeningeal carcinomatosis of the cerebellum and punctate focus of enhancement of left trigeminal nerve, probably related to carcinomatosis. There was mass effect related to the right inferior cerebellar metastasis results in slight right to left midline shift of the posterior fossa, asymmetric 7 mm inferior tonsillar herniation on the right, and partial effacement of the fourth ventricle.  There was a 6 mm metastasis at the foramina of Magendie. There was a punctate focus of enhancement in right frontal subcortical white matter of uncertain significance, possibly an additional metastasis.   CT chest, abdomen, pelvis on 01/03/2017 showed no evidence of distant metastatic disease beyond the CNS.  The patient met with medical oncologist Dr. Lindi Adie on 01/04/2017. He recommended Zoladex with letrozole and lapatinib palliative treatment after palliative radiation therapy.  The patient has also been discussed with neurosurgery.  No recommendation for resection.  There is concern that if an LP is performed, the brain could herniate at this time.  However, intrathecal chemotherapy might be considered later if the intracranial pressure decreases.  The patient was accompanied by her husband and a Dentist today. The patient is scheduled for a second opinion at [facility] next Wednesday. The patient reports headaches. These headaches have improved since starting dexamethasone 4 mg four times daily. She denies nausea, vision changes, dizziness, or tinnitus. She is able to walk without issue. She denies weakness in her legs. She denies seizures. She has been playing football and doing exercises. She reports occasional lower back cramping on and off but no constant spinal pain. She reports intermittent feeling of fatigue in her back. She denies facial numbness.   ALLERGIES:  is allergic to zofran [ondansetron hcl].  Meds: Current Outpatient Prescriptions  Medication Sig Dispense Refill  . acetaminophen (TYLENOL) 500 MG tablet Take 1,000 mg by mouth every 6 (six) hours as needed for headache.    . dexamethasone (DECADRON) 4 MG tablet Take 1 tablet (4 mg total) by mouth 4 (four) times daily. 60 tablet 0  . diphenhydrAMINE (  BENADRYL) 25 MG tablet Take 50 mg by mouth every 6 (six) hours as needed for itching.    Marland Kitchen ibuprofen (ADVIL,MOTRIN) 200 MG tablet Take 400 mg by mouth every 6 (six)  hours as needed for headache.    Marland Kitchen LORazepam (ATIVAN) 0.5 MG tablet Take 1 tablet (0.5 mg total) by mouth every 8 (eight) hours. 30 tablet 0  . omeprazole (PRILOSEC) 20 MG capsule Take 1 capsule (20 mg total) by mouth 2 (two) times daily. 60 capsule 0  . tamoxifen (NOLVADEX) 20 MG tablet Take 1 tablet (20 mg total) by mouth daily. 90 tablet 3   No current facility-administered medications for this encounter.    Facility-Administered Medications Ordered in Other Encounters  Medication Dose Route Frequency Provider Last Rate Last Dose  . sodium chloride 0.9 % injection 10 mL  10 mL Intravenous PRN Nicholas Lose, MD   10 mL at 12/09/14 1632    Review of Systems: Notable for that above. She is otherwise in her USOH. Otherwise complete 10 point ROS is negative.    Physical Findings:  height is 5' 2" (1.575 m) and weight is 115 lb 12.8 oz (52.5 kg). Her temperature is 98.4 F (36.9 C). Her blood pressure is 101/61 and her pulse is 92. Her oxygen saturation is 100%.   General: Alert and oriented, in no acute distress HEENT: Head is normocephalic. Extraocular movements are intact, no nystagmus. Oropharynx is clear. No thrush noted. Vision is grossly intact. Hearing is intact. Neck: Neck is supple, no palpable cervical or supraclavicular lymphadenopathy. Heart: Regular in rate and rhythm with no murmurs, rubs, or gallops. Chest: Clear to auscultation bilaterally, with no rhonchi, wheezes, or rales. Abdomen: Soft, nontender, nondistended, with no rigidity or guarding. Extremities: No cyanosis or edema. Lymphatics: see Neck Exam Skin: No concerning lesions. Musculoskeletal: symmetric strength and muscle tone throughout. No tenderness to palpation along her spine. Neurologic: Cranial nerves II through XII are grossly intact. No obvious focalities. Speech is fluent. Coordination is intact. Finger to nose testing is intact. Gait is without any deficits, she can walk in a straight line and turn around  without any difficulty.  Psychiatric: Judgment and insight are intact. Affect is appropriate.  Lab Findings: Lab Results  Component Value Date   WBC 6.5 12/03/2016   HGB 11.2 (L) 12/03/2016   HCT 35.6 12/03/2016   MCV 64.0 (L) 12/03/2016   PLT 231 12/03/2016    Radiographic Findings: Ct Head Wo Contrast  Result Date: 01/03/2017 CLINICAL DATA:  Headache for over a week. EXAM: CT HEAD WITHOUT CONTRAST TECHNIQUE: Contiguous axial images were obtained from the base of the skull through the vertex without intravenous contrast. COMPARISON:  03/11/2015 FINDINGS: Brain: Isodense hyperdense mass in the inferior right cerebellum measuring 31 x 19 x 19 mm. Moderate vasogenic edema and distortion of the fourth ventricle. Subtle but definite increase in lateral ventricular volume compared to 2016, especially when compared at the temporal horns. No second mass is seen. No acute or remote infarct. No acute hemorrhage. Vascular: No hyperdense vessel or unexpected calcification. Skull: Normal. Negative for fracture or focal lesion. Sinuses/Orbits: Negative Other: These results were called by telephone at the time of interpretation on 01/03/2017 at 8:52 am to Dr. Gareth Morgan , who verbally acknowledged these results. IMPRESSION: 3 x 2 x 2 cm right cerebellar mass, likely a breast cancer metastasis. Moderate vasogenic edema and fourth ventricular narrowing. When compared to 2016 head CT there has been a mild increase in lateral ventricular volume.  Electronically Signed   By: Monte Fantasia M.D.   On: 01/03/2017 08:56   Ct Chest W Contrast  Result Date: 01/03/2017 CLINICAL DATA:  Newly diagnosed brain mass. History of breast cancer, status post chemotherapy and radiation therapy. Assess for additional metastatic or primary disease in the chest, abdomen or pelvis. Initial encounter. EXAM: CT CHEST, ABDOMEN, AND PELVIS WITH CONTRAST TECHNIQUE: Multidetector CT imaging of the chest, abdomen and pelvis was performed  following the standard protocol during bolus administration of intravenous contrast. CONTRAST:  130m ISOVUE-300 IOPAMIDOL (ISOVUE-300) INJECTION 61% COMPARISON:  CT of the chest, abdomen and pelvis from 09/16/2016 FINDINGS: CT CHEST FINDINGS Cardiovascular: The heart is normal in size. The thoracic aorta is unremarkable in appearance. No calcific atherosclerotic disease is seen. The great vessels are grossly unremarkable in appearance. Mediastinum/Nodes: The mediastinum is unremarkable in appearance. No mediastinal lymphadenopathy is seen. No pericardial effusion is identified. The thyroid gland is unremarkable. No axillary lymphadenopathy is appreciated. Postoperative change is seen at the left axilla. Lungs/Pleura: Minimal pleural and peripheral scarring at the left upper lobe likely reflects postradiation change, and is grossly stable from November. No pleural effusion or pneumothorax is seen. No masses are identified. Musculoskeletal: No acute osseous abnormalities are identified. The visualized musculature is unremarkable in appearance. The patient is status post left-sided mastectomy. CT ABDOMEN PELVIS FINDINGS Hepatobiliary: The liver is unremarkable in appearance. The gallbladder is unremarkable in appearance. The common bile duct remains normal in caliber. Pancreas: The pancreas is within normal limits. Spleen: The spleen is unremarkable in appearance. Adrenals/Urinary Tract: The adrenal glands are unremarkable in appearance. The kidneys are within normal limits. There is no evidence of hydronephrosis. No renal or ureteral stones are identified. No perinephric stranding is seen. Stomach/Bowel: The stomach is unremarkable in appearance. The small bowel is within normal limits. The appendix is normal in caliber, without evidence of appendicitis. The colon is unremarkable in appearance. Vascular/Lymphatic: The abdominal aorta is unremarkable in appearance. The inferior vena cava is grossly unremarkable. No  retroperitoneal lymphadenopathy is seen. No pelvic sidewall lymphadenopathy is identified. Reproductive: The bladder is mildly distended and within normal limits. The uterus is grossly unremarkable in appearance. The ovaries are relatively symmetric. No suspicious adnexal masses are seen. A 2.8 cm right adnexal cystic focus likely reflects a follicle. Other: No additional soft tissue abnormalities are seen. Musculoskeletal: No acute osseous abnormalities are identified. The visualized musculature is unremarkable in appearance. IMPRESSION: 1. No acute abnormality seen within the chest, abdomen or pelvis. 2. Minimal pleural and peripheral scarring at the left upper lung lobe likely reflects postradiation change, is grossly stable from November. Electronically Signed   By: JGarald BaldingM.D.   On: 01/03/2017 17:22   Mr BJeri CosWDUContrast  Result Date: 01/03/2017 CLINICAL DATA:  31y/o F; history of breast cancer with completion of chemotherapy currently on tamoxifen presenting with a mass lesion in the brain. EXAM: MRI HEAD WITHOUT AND WITH CONTRAST TECHNIQUE: Multiplanar, multiecho pulse sequences of the brain and surrounding structures were obtained without and with intravenous contrast. CONTRAST:  196mMULTIHANCE GADOBENATE DIMEGLUMINE 529 MG/ML IV SOLN COMPARISON:  01/03/2017 CT head FINDINGS: Brain: No diffusion signal abnormality. No abnormal susceptibility hypointensity. No hydrocephalus. No extra-axial collection. Intra-axial enhancing mass in the right inferior cerebellar hemisphere measuring 33 x 22 mm (series 11, image 32). Second enhancing mass in the left superior cerebellar hemisphere measuring 11 x 25 mm (series 11, image 48). There is mild edema and local mass effect associated with  the masses. Mass effect results in partial effacement of the fourth ventricle, slight right-to-left midline shift of the posterior fossa, and asymmetric rightward downward herniation of cerebellar tonsils of 7 mm.  Numerous small nodular foci of enhancement are present throughout the cerebellar folia compatible with leptomeningeal carcinomatosis. Of note there is a 6 mm focus centered at the foramina of Magendie. Additionally, there is a punctate focus of enhancement within the left trigeminal nerve complex (series 11 image 38). Punctate focus of enhancement in right frontal subcortical white matter (series 11, image 82). Vascular: Normal flow voids. Skull and upper cervical spine: Normal marrow signal. Sinuses/Orbits: Right greater left maxillary sinus disease with mucous retention cyst. No significant abnormal signal of mastoid air cells. Orbits are unremarkable. Other: None. IMPRESSION: 1. Intra-axial mass within the right inferior cerebellar hemisphere measuring up to 33 mm and second enhancing mass within the left superior cerebellar hemisphere measuring up to 25 mm likely representing metastatic disease. 2. Leptomeningeal carcinomatosis of the cerebellum. 3. Punctate focus of enhancement of left trigeminal nerve, probably related to carcinomatosis. 4. Mass effect related to the right inferior cerebellar metastasis results in slight right to left midline shift of the posterior fossa, asymmetric 7 mm inferior tonsillar herniation on the right, and partial effacement of the fourth ventricle. 5. 6 mm metastasis at the foramina of Magendie. Combined with mass effect from the right cerebellar hemisphere mass likely effacing the right-sided foramen of [place]chka, there is potential risk for developing hydrocephalus. No hydrocephalus at this time. 6. Punctate focus of enhancement in right frontal subcortical white matter of uncertain significance, possibly an additional metastasis. Careful attention at follow-up recommended. 7. Maxillary sinus paranasal sinus disease. Electronically Signed   By: Kristine Garbe M.D.   On: 01/03/2017 14:32   Ct Abdomen Pelvis W Contrast  Result Date: 01/03/2017 CLINICAL DATA:  Newly  diagnosed brain mass. History of breast cancer, status post chemotherapy and radiation therapy. Assess for additional metastatic or primary disease in the chest, abdomen or pelvis. Initial encounter. EXAM: CT CHEST, ABDOMEN, AND PELVIS WITH CONTRAST TECHNIQUE: Multidetector CT imaging of the chest, abdomen and pelvis was performed following the standard protocol during bolus administration of intravenous contrast. CONTRAST:  149m ISOVUE-300 IOPAMIDOL (ISOVUE-300) INJECTION 61% COMPARISON:  CT of the chest, abdomen and pelvis from 09/16/2016 FINDINGS: CT CHEST FINDINGS Cardiovascular: The heart is normal in size. The thoracic aorta is unremarkable in appearance. No calcific atherosclerotic disease is seen. The great vessels are grossly unremarkable in appearance. Mediastinum/Nodes: The mediastinum is unremarkable in appearance. No mediastinal lymphadenopathy is seen. No pericardial effusion is identified. The thyroid gland is unremarkable. No axillary lymphadenopathy is appreciated. Postoperative change is seen at the left axilla. Lungs/Pleura: Minimal pleural and peripheral scarring at the left upper lobe likely reflects postradiation change, and is grossly stable from November. No pleural effusion or pneumothorax is seen. No masses are identified. Musculoskeletal: No acute osseous abnormalities are identified. The visualized musculature is unremarkable in appearance. The patient is status post left-sided mastectomy. CT ABDOMEN PELVIS FINDINGS Hepatobiliary: The liver is unremarkable in appearance. The gallbladder is unremarkable in appearance. The common bile duct remains normal in caliber. Pancreas: The pancreas is within normal limits. Spleen: The spleen is unremarkable in appearance. Adrenals/Urinary Tract: The adrenal glands are unremarkable in appearance. The kidneys are within normal limits. There is no evidence of hydronephrosis. No renal or ureteral stones are identified. No perinephric stranding is seen.  Stomach/Bowel: The stomach is unremarkable in appearance. The small bowel is within  normal limits. The appendix is normal in caliber, without evidence of appendicitis. The colon is unremarkable in appearance. Vascular/Lymphatic: The abdominal aorta is unremarkable in appearance. The inferior vena cava is grossly unremarkable. No retroperitoneal lymphadenopathy is seen. No pelvic sidewall lymphadenopathy is identified. Reproductive: The bladder is mildly distended and within normal limits. The uterus is grossly unremarkable in appearance. The ovaries are relatively symmetric. No suspicious adnexal masses are seen. A 2.8 cm right adnexal cystic focus likely reflects a follicle. Other: No additional soft tissue abnormalities are seen. Musculoskeletal: No acute osseous abnormalities are identified. The visualized musculature is unremarkable in appearance. IMPRESSION: 1. No acute abnormality seen within the chest, abdomen or pelvis. 2. Minimal pleural and peripheral scarring at the left upper lung lobe likely reflects postradiation change, is grossly stable from November. Electronically Signed   By: Garald Balding M.D.   On: 01/03/2017 17:22    Impression/Plan: Brain metastases with leptomeningeal disease, breast cancer relapse  This is a wonderful 31 year old woman with brain metastases with leptomeningeal disease. The patient would be recommended for radiotherapy. The patient denies any spinal symptoms at this time.  I think the risks of craniospinal RT outweigh the potential benefits but I do think there is palliative benefit to whole brain RT at this time, which can be started urgently.  It was a pleasure meeting the patient today. We discussed the risks, benefits, and side effects of palliative whole brain radiotherapy.  We talked in detail about acute and late effects. She understands that some of the most bothersome acute effects will be significant fatigue and hair loss. We talked about late effects which  include but are not necessarily limited to HAs, nausea, neurologic decline and obstructive hydrocephalus. No guarantees of treatment were given. A consent form was signed and placed in the patient's medical record. The patient is enthusiastic about proceeding with treatment. I look forward to participating in the patient's care.  The patient will proceed with CT simulation later today and will begin treatment tomorrow.  She will receive 10 fractions of whole brain RT. We can consider SRS boost to gross disease thereafter, depending on her response to treatment, if there will be palliative reasons to do so.   The patient is scheduled for a second opinion at [facility] next Wednesday - in a week - to discuss systemic options.  _____________________________________   Eppie Gibson, MD   This document serves as a record of services personally performed by Eppie Gibson, MD. It was created on her behalf by Arlyce Harman, a trained medical scribe. The creation of this record is based on the scribe's personal observations and the provider's statements to them. This document has been checked and approved by the attending provider.

## 2017-01-05 NOTE — Progress Notes (Signed)
[place] Psychosocial Distress Screening Clinical Social Work  Clinical Social Work was referred by distress screening protocol and interpreter.  The patient scored a 5 on the Psychosocial Distress Thermometer which indicates moderate distress. Clinical Education officer, museum met with pt and significant other at [facility] to assess for distress and other psychosocial needs. CSW team worked closely with family in the past due to financial concerns and need for support. Pt on table and CSW mostly spoke with [place], significant other. He reports they have contacted school and [employer] for counseling and assistance for Pt's children. 30yo daughter is in need of support. CSW provided [employer] handouts. CSW also explained role of CSW/Support Team to [place]. They report assistance with gas would be most helpful. CSW to refer to financial counselor as well. CSW will follow closely for support and help with resources.   ONCBCN DISTRESS SCREENING 01/05/2017  Screening Type Initial Screening  Distress experienced in past week (1-10) 5  Emotional problem type Feeling hopeless  Physical Problem type Pain;Sleep/insomnia    Clinical Social Worker follow up needed: Yes.    If yes, follow up plan:  See above Loren Racer, LCSW, OSW-C Clinical Social Worker [place]  [facility] Phone: [phone] Fax: [phone]

## 2017-01-06 ENCOUNTER — Ambulatory Visit
Admission: RE | Admit: 2017-01-06 | Discharge: 2017-01-06 | Disposition: A | Discharge status: Home / Self Care | Payer: Self-pay | Source: Ambulatory Visit | Attending: Radiation Oncology | Admitting: Radiation Oncology

## 2017-01-07 ENCOUNTER — Encounter: Payer: Self-pay | Admitting: Radiation Oncology

## 2017-01-07 ENCOUNTER — Ambulatory Visit
Admission: RE | Admit: 2017-01-07 | Discharge: 2017-01-07 | Disposition: A | Discharge status: Home / Self Care | Payer: Self-pay | Source: Ambulatory Visit | Attending: Radiation Oncology | Admitting: Radiation Oncology

## 2017-01-07 NOTE — Progress Notes (Signed)
Financial Counselor--spoke with patient today--gave her information on our grants--she is going to bring back her income verification on Monday

## 2017-01-10 ENCOUNTER — Other Ambulatory Visit: Payer: Self-pay | Admitting: Radiation Therapy

## 2017-01-10 ENCOUNTER — Ambulatory Visit
Admission: RE | Admit: 2017-01-10 | Discharge: 2017-01-10 | Disposition: A | Discharge status: Home / Self Care | Payer: Self-pay | Source: Ambulatory Visit | Attending: Radiation Oncology | Admitting: Radiation Oncology

## 2017-01-10 ENCOUNTER — Encounter: Payer: Self-pay | Admitting: Radiation Oncology

## 2017-01-10 VITALS — BP 96/71 | HR 81 | Temp 98.2°F | Resp 12 | Wt 116.0 lb

## 2017-01-10 DIAGNOSIS — C7949 Secondary malignant neoplasm of other parts of nervous system: Principal | ICD-10-CM

## 2017-01-10 DIAGNOSIS — C7931 Secondary malignant neoplasm of brain: Secondary | ICD-10-CM | POA: Insufficient documentation

## 2017-01-10 DIAGNOSIS — C801 Malignant (primary) neoplasm, unspecified: Secondary | ICD-10-CM | POA: Insufficient documentation

## 2017-01-10 MED ORDER — BIAFINE EX EMUL
CUTANEOUS | Status: DC | PRN
  Administered 2017-01-10: 14:00:00 via TOPICAL

## 2017-01-10 MED ORDER — BIAFINE EX EMUL: CUTANEOUS | Status: DC | PRN

## 2017-01-10 NOTE — Progress Notes (Signed)
PAIN: She rates her pain as a 3 on a scale of 0-10. intermittent and pulsing over base if back of neck and bilateral temporal. NEURO: Pt alert & oriented x 3 with fluent speech, gait normal, reflexes normal and symmetric. Pt denies visual disturbances, PERRLA, ringing in bilateral ears, after radiation treatment last week. Pt presenting. SKIN: Noted reports itching to scalp..  OTHER: Pt complains of fatigue, weakness and loss of sleep. Decadron? 4 mg, bid. *Esophagitis? Indigestion? Confusion? Thrush?  Diabetic? No.  BP 96/71   Pulse 81   Temp 98.2 F (36.8 C) (Oral)   Resp 12   Wt 116 lb (52.6 kg)   SpO2 100%   BMI 21.22 kg/m  Wt Readings from Last 3 Encounters:  01/10/17 116 lb (52.6 kg)  01/05/17 115 lb 12.8 oz (52.5 kg)  01/04/17 116 lb (52.6 kg)  Orthostatic BP: 94/69  Pt education today

## 2017-01-10 NOTE — Addendum Note (Signed)
Encounter addended by: Jenene Slicker, RN on: 01/10/2017  2:26 PM<BR>    Actions taken: Patient Education assessment filed, Sign clinical note, Diagnosis association updated, Order list changed

## 2017-01-10 NOTE — Addendum Note (Signed)
Encounter addended by: Jenene Slicker, RN on: 01/10/2017  2:28 PM<BR>    Actions taken: [facility] administration accepted

## 2017-01-10 NOTE — Progress Notes (Signed)
   Weekly Management Note:  Outpatient    ICD-9-CM ICD-10-CM   1. Brain metastases (HCC) 198.3 C79.31     Current Dose:  9 Gy  Projected Dose: 30 Gy   Narrative:  The patient presents for routine under treatment assessment.  CBCT/MVCT images/Port film x-rays were reviewed.  The chart was checked ... since starting brain RT, she's had some numbness in the back of her neck.  Ambulating fine, no arm or leg numbness or weakness.  Taking Decadron 4mg  BID  HAs no worse  Transient tinnitus   Physical Findings:  weight is 116 lb (52.6 kg). Her oral temperature is 98.2 F (36.8 C). Her blood pressure is 96/71 and her pulse is 81. Her respiration is 12 and oxygen saturation is 100%.   Wt Readings from Last 3 Encounters:  01/10/17 116 lb (52.6 kg)  01/05/17 115 lb 12.8 oz (52.5 kg)  01/04/17 116 lb (52.6 kg)   NAD Ambulatory  Impression:  The patient is tolerating radiotherapy.  Plan:  Continue radiotherapy as planned. Will ask navigator to arrange imaging to be done this week in the form of: total spine MRI staging given her LM disease and new neck numbness   ________________________________   Eppie Gibson, M.D.

## 2017-01-11 ENCOUNTER — Ambulatory Visit
Admission: RE | Admit: 2017-01-11 | Discharge: 2017-01-11 | Disposition: A | Discharge status: Home / Self Care | Payer: Self-pay | Source: Ambulatory Visit | Attending: Radiation Oncology | Admitting: Radiation Oncology

## 2017-01-12 ENCOUNTER — Ambulatory Visit
Admission: RE | Admit: 2017-01-12 | Discharge: 2017-01-12 | Disposition: A | Discharge status: Home / Self Care | Payer: Self-pay | Source: Ambulatory Visit | Attending: Radiation Oncology | Admitting: Radiation Oncology

## 2017-01-13 ENCOUNTER — Ambulatory Visit (HOSPITAL_COMMUNITY): Payer: Self-pay

## 2017-01-13 ENCOUNTER — Ambulatory Visit (HOSPITAL_COMMUNITY)
Admission: RE | Admit: 2017-01-13 | Discharge: 2017-01-13 | Disposition: A | Discharge status: Home / Self Care | Payer: Self-pay | Source: Ambulatory Visit | Attending: Radiation Oncology | Admitting: Radiation Oncology

## 2017-01-13 ENCOUNTER — Ambulatory Visit
Admission: RE | Admit: 2017-01-13 | Discharge: 2017-01-13 | Disposition: A | Discharge status: Home / Self Care | Payer: Self-pay | Source: Ambulatory Visit | Attending: Radiation Oncology | Admitting: Radiation Oncology

## 2017-01-13 DIAGNOSIS — C801 Malignant (primary) neoplasm, unspecified: Secondary | ICD-10-CM | POA: Insufficient documentation

## 2017-01-13 DIAGNOSIS — C7949 Secondary malignant neoplasm of other parts of nervous system: Secondary | ICD-10-CM | POA: Insufficient documentation

## 2017-01-13 DIAGNOSIS — C7931 Secondary malignant neoplasm of brain: Secondary | ICD-10-CM | POA: Insufficient documentation

## 2017-01-13 MED ORDER — GADOBENATE DIMEGLUMINE 529 MG/ML IV SOLN
10.0000 mL | Freq: Once | INTRAVENOUS | Status: AC | PRN
  Administered 2017-01-13: 10 mL via INTRAVENOUS

## 2017-01-14 ENCOUNTER — Encounter: Payer: Self-pay | Admitting: Radiation Oncology

## 2017-01-14 ENCOUNTER — Other Ambulatory Visit: Payer: Self-pay | Admitting: Radiation Oncology

## 2017-01-14 ENCOUNTER — Ambulatory Visit
Admission: RE | Admit: 2017-01-14 | Discharge: 2017-01-14 | Disposition: A | Discharge status: Home / Self Care | Payer: Self-pay | Source: Ambulatory Visit | Attending: Radiation Oncology | Admitting: Radiation Oncology

## 2017-01-14 DIAGNOSIS — C7931 Secondary malignant neoplasm of brain: Secondary | ICD-10-CM

## 2017-01-14 MED ORDER — DEXAMETHASONE 2 MG PO TABS: ORAL_TABLET | ORAL | 0 refills | Status: DC

## 2017-01-14 NOTE — Progress Notes (Signed)
   Weekly Management Note:  Outpatient      ICD-9-CM ICD-10-CM   1. Brain metastases (HCC) 198.3 C79.31     Current Dose:  21 Gy  Projected Dose: 30 Gy   Narrative:  The patient presents for routine under treatment assessment.  CBCT/MVCT images/Port film x-rays were reviewed.  The chart was checked. We discussed the patient's recent spine MR from 01/13/17. This showed spinal leptomeningeal carcinomatosis is present. The most superior focus of abnormal enhancement identified is at the lower T8 level and nodular foci extend as far inferiorly as the S2 level. Enhancement is in greatest concentration along the conus and cauda equina. No intramedullary cord lesion identified.  No cord edema. No abnormal enhancement of the spine. No significant degenerative changes.  The patient reports her headaches are better than they have been. She reports feeling tired and weak in her legs but ambulates well. She denies any difficulty walking. She questions how long she should continue taking the steroids. She would like to taper if possible.  The patient is accompanied by family and a Optometrist.  Physical Findings:  vitals were not taken for this visit.  Wt Readings from Last 3 Encounters:  01/10/17 116 lb (52.6 kg)  01/05/17 115 lb 12.8 oz (52.5 kg)  01/04/17 116 lb (52.6 kg)   No oral thrush. Good strength in hip flexion bilaterally.  Impression:  The patient is tolerating radiotherapy.  Plan:  Continue radiotherapy as planned.  Taper Dexamethasone to 4 mg in the morning, and 2 mg in the evening. The patient prefers a new prescription for 2 mg tablets, so I will prescribe these today. [facility] prefers [facility] on [address] in [place]. I will present her at tumor board next week and see her in 3 days. _______________________   Eppie Gibson, M.D.   This document serves as a record of services personally performed by Eppie Gibson, MD. It was created on her behalf by Maryla Morrow, a trained medical  scribe. The creation of this record is based on the scribe's personal observations and the provider's statements to them. This document has been checked and approved by the attending provider.

## 2017-01-17 ENCOUNTER — Encounter: Payer: Self-pay | Admitting: Emergency Medicine

## 2017-01-17 ENCOUNTER — Ambulatory Visit
Admission: RE | Admit: 2017-01-17 | Discharge: 2017-01-17 | Disposition: A | Discharge status: Home / Self Care | Payer: Self-pay | Source: Ambulatory Visit | Attending: Radiation Oncology | Admitting: Radiation Oncology

## 2017-01-17 VITALS — BP 88/70 | HR 95 | Temp 97.8°F | Resp 20 | Wt 114.6 lb

## 2017-01-17 DIAGNOSIS — C7931 Secondary malignant neoplasm of brain: Secondary | ICD-10-CM

## 2017-01-17 NOTE — Progress Notes (Signed)
   Weekly Management Note:  Outpatient      ICD-9-CM ICD-10-CM   1. Brain metastases (HCC) 198.3 C79.31     Current Dose:  24 Gy  Projected Dose: 30 Gy   Narrative:  The patient presents for routine under treatment assessment.  CBCT/MVCT images/Port film x-rays were reviewed.  The chart was checked.    Pt neurologically doing okay today.  Headaches are relatively well controlled.  Legs feel better.  She has had back pain radiating to low abdomen for just a few hours today; sometimes this occurs at menses.  She is aligned with plan for chemo.    I went over her spine MRI images with her today and discussed tumor board recommendations from this AM.  The patient is accompanied by sister  and a Optometrist.  Physical Findings:  weight is 114 lb 9.6 oz (52 kg). Her oral temperature is 97.8 F (36.6 C). Her blood pressure is 88/70 (abnormal) and her pulse is 95. Her respiration is 20 and oxygen saturation is 98%.   Wt Readings from Last 3 Encounters:  01/17/17 114 lb 9.6 oz (52 kg)  01/10/17 116 lb (52.6 kg)  01/05/17 115 lb 12.8 oz (52.5 kg)   No oral thrush.  ambulatory Blunted affect, tearful  Impression:  The patient is tolerating radiotherapy.  Plan:  Continue radiotherapy as planned.  Tolerating Dexamethasone to 4 mg in the morning, and 2 mg in the evening. Continue this and taper only if med/onc advises.  She sees Dr Lindi Adie next week.  She and I spoke about the likelihood of IT chemotherapy.  Dr. Lindi Adie and I spoke about her case.  I explained to her and to Dr Lindi Adie that I can give palliative RT to the spine if chemotherapy doesn't help, or if symptoms develop that warrant this.  Most concerning areas to cover would be top T10-bottom L3, consider going to bottom of S2.  Patient understands that her disease is incurable but for now wants to be aggressive in management. Recommend pt see palliative care.  Mont Dutton, Navigator asked to arrange if  possible. _______________________   Eppie Gibson, M.D.   This document serves as a record of services personally performed by Eppie Gibson, MD. It was created on her behalf by Maryla Morrow, a trained medical scribe. The creation of this record is based on the scribe's personal observations and the provider's statements to them. This document has been checked and approved by the attending provider.

## 2017-01-17 NOTE — Progress Notes (Signed)
[name] completed 8th fraction to whole brain.  Patient denies any pain, headache, auditory issues.  Denies any issues with fine motor skills or nausea.   Complains of dizziness this morning, itching to whole head and ears.  States she was given a cream, unable to recall but does not use and feels like she does not have the need to use.  Reports taking Decadron according to prescription.    Vitals:   01/17/17 1249  BP: (!) 88/70  Pulse: 95  Resp: 20  Temp: 97.8 F (36.6 C)  TempSrc: Oral  SpO2: 98%  Weight: 114 lb 9.6 oz (52 kg)     Wt Readings from Last 3 Encounters:  01/17/17 114 lb 9.6 oz (52 kg)  01/10/17 116 lb (52.6 kg)  01/05/17 115 lb 12.8 oz (52.5 kg)

## 2017-01-18 ENCOUNTER — Ambulatory Visit
Admission: RE | Admit: 2017-01-18 | Discharge: 2017-01-18 | Disposition: A | Discharge status: Home / Self Care | Payer: Self-pay | Source: Ambulatory Visit | Attending: Radiation Oncology | Admitting: Radiation Oncology

## 2017-01-19 ENCOUNTER — Ambulatory Visit
Admission: RE | Admit: 2017-01-19 | Discharge: 2017-01-19 | Disposition: A | Discharge status: Home / Self Care | Payer: Self-pay | Source: Ambulatory Visit | Attending: Radiation Oncology | Admitting: Radiation Oncology

## 2017-01-19 ENCOUNTER — Other Ambulatory Visit: Payer: Self-pay

## 2017-01-19 DIAGNOSIS — Z515 Encounter for palliative care: Secondary | ICD-10-CM

## 2017-01-19 DIAGNOSIS — C7931 Secondary malignant neoplasm of brain: Secondary | ICD-10-CM

## 2017-01-19 DIAGNOSIS — Z7189 Other specified counseling: Secondary | ICD-10-CM

## 2017-01-19 DIAGNOSIS — C50919 Malignant neoplasm of unspecified site of unspecified female breast: Secondary | ICD-10-CM

## 2017-01-19 DIAGNOSIS — Z170 Estrogen receptor positive status [ER+]: Principal | ICD-10-CM

## 2017-01-19 DIAGNOSIS — C50512 Malignant neoplasm of lower-outer quadrant of left female breast: Secondary | ICD-10-CM

## 2017-01-19 NOTE — Consult Note (Signed)
Consultation Note Date: 01/19/2017   Patient Name: [name]  DOB: [birth year]  MRN: [id number]  Age / Sex: 31 y.o., female  PCP: Nicholas Lose, MD Referring Physician: Eppie Gibson, MD  Reason for Consultation: Establishing goals of care and Psychosocial/spiritual support  HPI/Patient Profile: 31 y.o. 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 Positive for lymphovascular invasion, extracapsular tumor extension grade 3 ER 53%, PR 30%, HER-2 positive ratio 5.95, Ki-67 33%, T2, N3, M0 stage IIIc with high-grade DCIS status post left mastectomy 09/06/2014; completed adjuvant chemotherapy 01/20/2015 and completed Herceptin maintenance October 2016 and radiation started 02/11/2015 completed 03/25/2015 by Dr. Isidore Moos.   Brain MRI 01/03/2017: Intra-axial mass right inf cerebellum 33 mm, mass left sup cerebellum 25 mm, leptomeningeal carcinomatosis of cerebellum, left trigeminal nerve involvement, mass effect related to the right inferior cerebellar metastases slight right-to-left shift, 6 mm metastases and foramina of [place], punctate focus right frontal subcortical white matter  Radiology review: I discussed the case extensively with the Dr. Cyndy Freeze. She is not a candidate for neurosurgery. Unfortunately even a spinal tap can lead to major complications including a 10-20% risk of impingement of the cord. Because of these reasons, we elected not to pursue a lumbar puncture.  CT CAP 01/03/2017: No evidence of distant metastatic disease beyond the CNS  Per EMR Dr Sonny Dandy  discussed with the patient that she has poor prognosis and that the median survival with carcinomatous meningitis is likely  less  than 6 months. They requested a second opinion consultation,  and Dr. Wetzel Bjornstad at [facility] was recommended    SUMMARY OF ONCOLOGIC HISTORY:       Breast cancer of lower-outer quadrant of left female breast ([place])   06/06/2014 Imaging    Palpable Left breast mass at 12:00 position 1.9 cm, initial biopsy 06/12/2014 revealed fibrocystic changes.      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 The genes tested were ATM, BARD1, BRCA1, BRCA2, BRIP1, CDH1, CHEK2, MRE11A, MUTYH, NBN, NF1, PALB2, PTEN, RAD50, RAD51C, RAD51D, and TP53.      10/04/2014 - 01/20/2015 Chemotherapy    Patient was started on adjuvant chemotherapy with Taxotere, Carboplatin, Herceptin, Perjeta followed by Herceptin maintenance for 1 year.        02/11/2015 - 03/25/2015 Radiation Therapy    Adjuvant radiation therapy  05/01/2015 -  Anti-estrogen oral therapy    tamoxifen 20 mg daily      01/03/2017 Imaging    Brain MRI: Intra-axial mass right inf cerebellum 33 mm, mass left sup cerebellum 25 mm, leptomeningeal carcinomatosis of cerebellum, left trigeminal nerve involvement, mass effect related to the right inferior cerebellar metastases slight right-to-left shift, 6 mm metastases and foramina of [place], punctate focus right frontal subcortical white matter        Clinical Assessment and Goals of Care:  This NP Wadie Lessen reviewed medical records, received  report from team,  and then meet at the patient and her family (husband/Juan, sister/Janet) in the OP readaition-oncology clinic  to discuss diagnosis, prognosis, GOC, treatment option decisions.  Concept of Palliative Care was introduced  Most of today's discussion addressed the patient and her family's understanding of her current medical situation, treatment options, and plan for the future.  There clearly ware misunderstandings regarding her disease and treatment options.  I worry that even with interpretors information is being lost.  We discussed the seriousness of the situation and worry for a poor prognosis and the importance of patient and family having all the accurate information in order to make informed decisions. I shared with patient and her family that Dr Sonny Dandy is working with multidisciplinary team to ensure the best high quality, patient centered care.  The difference between a aggressive medical intervention path  and a palliative comfort care path for this patient at this time was had.  Values and goals of care important to patient and family were attempted to be elicited.  I spoke with Dr Sonny Dandy and his nurse and they are today contacting Dr Wetzel Bjornstad office for an appointment for another option as requested by family.   Questions and concerns addressed.   Family encouraged to call with questions or concerns.  PMT will continue to support holistically.  Patient requests a f/u visit, we will meet again on Monday at [place]     Additional Recommendations (Limitations, Scope, Preferences):   Patient and her family are open to all offered and available medical interventions to prolong life.  Dr Ula Lingo at [facility] for second opinion scheduled for March 21 st, Spanish interpretor to notify family  Appointment with Dr Sonny Dandy 01-25-17 at 30  F/U appointment with myself on Monday at 0900  Psycho-social/Spiritual:    This is a very difficult situation  for this young women and her family.  She and her husband have two young children ages 65 and 26.  They understand at an intellectual level the seriousness of the disease and the poor prognosis but emotionally they struggle with limitations of medical interventions to prolong her life, "there must be something somebody can do"  We discussed the disease of metastatic cancer, limitations of medical interventions and mortality.  Emotional support offered to all.   Patient is requesting f/u visits with this NP, will be happy to accommodate this family any way I can help.  They will call with requests    Additional Recommendations: Discussed resources at area hospices for both the paietnt and her family, including the children  Prognosis:   Dr Sonny Dandy shared with patient and her family the poor prognosis of less than six months as documented in the visit note 01-05-17. .  Today we discussed the long term poor prognosis in general terms, no time frame stated. I worry that her prognosis is indeed less thatn 6 months.  The patient is returning  or a f/u visit on Monday and prognosis will be broached again at that time.     Primary Diagnoses: Present on Admission: **None**   I have reviewed the medical record, interviewed the patient and family, and examined the patient. The following aspects are pertinent.  Past Medical History:  Diagnosis Date  . Anemia    takes iron supplement  . Broken tooth 12/01/2015   left upper back  . History of breast cancer 2015   left  . History of chemotherapy    finished 07/2015  . Multiple blisters 11/2015   tongue - states from chemo   Social History   Social History  . Marital status: Legally Separated    Spouse name: N/A  . Number of children: N/A  . Years of education: N/A   Social History Main Topics  . Smoking status: Never Smoker  . Smokeless tobacco: Never Used  . Alcohol use No  . Drug use: No  . Sexual activity: Yes    Birth control/  protection: Condom   Other Topics Concern  . Not on file   Social History Narrative  . No narrative on file   Family History  Problem Relation Age of Onset  . Diabetes Mother   . Hypertension Mother   . Diabetes Maternal Grandmother   . Hypertension Maternal Grandmother   . Stomach cancer Maternal Grandmother   . Stomach cancer Maternal Grandfather    Scheduled Meds: Continuous Infusions: PRN Meds:. Medications Prior to Admission:  Prior to Admission medications   Medication Sig Start Date End Date Taking? Authorizing Provider  acetaminophen (TYLENOL) 500 MG tablet Take 1,000 mg by mouth every 6 (six) hours as needed for headache.    Historical Provider, MD  dexamethasone (DECADRON) 2 MG tablet Take as directed.  As of 01-14-17, take your 56m tablet in the AM and your 222mtablet in the PM. 01/14/17   SaEppie GibsonMD  dexamethasone (DECADRON) 4 MG tablet Take 1 tablet (4 mg total) by mouth 4 (four) times daily. 01/03/17   MaTanna FurryMD  diphenhydrAMINE (BENADRYL) 25 MG tablet Take 50 mg by mouth every 6 (six) hours as needed for itching.    Historical Provider, MD  ibuprofen (ADVIL,MOTRIN) 200 MG tablet Take 400 mg by mouth every 6 (six) hours as needed for headache.    Historical Provider, MD  LORazepam (ATIVAN) 0.5 MG tablet Take 1 tablet (0.5 mg total) by mouth every 8 (eight) hours. 01/04/17   ViNicholas LoseMD  omeprazole (PRILOSEC) 20 MG capsule Take 1 capsule (20 mg total) by mouth 2 (two) times daily. 01/03/17   MaTanna FurryMD  tamoxifen (NOLVADEX) 20 MG tablet Take 1 tablet (20 mg total) by mouth daily. 12/03/16   ViNicholas LoseMD   Allergies  Allergen Reactions  . Zofran [Ondansetron Hcl] Itching   Review of Systems  Constitutional: Positive for fatigue.  Neurological: Positive for headaches.    Physical Exam  Constitutional: She is oriented to person, place, and time. She appears well-developed.  Pulmonary/Chest: Effort normal.  Neurological: She is alert and oriented  to person, place, and time.  Skin: Skin is warm and dry.    Vital Signs: There were no vitals taken for this visit.        SpO2:   O2 Device:  O2 Flow Rate: .   IO: Intake/output summary: No intake or output data in the 24 hours ending 01/19/17 2100  LBM:   Baseline Weight:   Most recent  weight:       Palliative Assessment/Data: 70%   Discussed with Dr Isidore Moos aand Dr Sonny Dandy  Time In: 1100 Time Out: 1230 Time Total: 90 min Greater than 50%  of this time was spent counseling and coordinating care related to the above assessment and plan.  Signed by: Wadie Lessen, NP   Please contact Palliative Medicine Team phone at [phone] for questions and concerns.  For individual provider: See Shea Evans

## 2017-01-20 ENCOUNTER — Other Ambulatory Visit: Payer: Self-pay | Admitting: Neurological Surgery

## 2017-01-20 ENCOUNTER — Telehealth: Payer: Self-pay | Admitting: Hematology and Oncology

## 2017-01-20 NOTE — Telephone Encounter (Signed)
Faxed records to Dr. Wetzel Bjornstad. Waiting for appt.

## 2017-01-21 ENCOUNTER — Other Ambulatory Visit: Payer: Self-pay | Admitting: Radiation Therapy

## 2017-01-21 ENCOUNTER — Encounter: Payer: Self-pay | Admitting: Radiation Oncology

## 2017-01-21 DIAGNOSIS — C7931 Secondary malignant neoplasm of brain: Secondary | ICD-10-CM

## 2017-01-21 NOTE — Progress Notes (Signed)
  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 During treatment, the patient noted her headaches were well controlled / improved. She was seen by palliative care.  Plan: The patient has completed radiation treatment. The patient was scheduled to return to radiation oncology clinic for routine followup in one month. I advised them to call or return sooner if they have any questions or concerns related to their recovery or treatment. She does have disease in her spinal canal; the plan is for this to be addressed with chemotherapy next with Dr. Lindi Adie.  -----------------------------------  Eppie Gibson, MD  This document serves as a record of services personally performed by Eppie Gibson, MD. It was created on her behalf by Bethann Humble, a trained medical scribe. The creation of this record is based on the scribe's personal observations and the provider's statements to them. This document has been checked and approved by the attending provider.

## 2017-01-24 ENCOUNTER — Ambulatory Visit
Admission: RE | Admit: 2017-01-24 | Discharge: 2017-01-24 | Disposition: A | Discharge status: Home / Self Care | Payer: Self-pay | Source: Ambulatory Visit | Attending: Internal Medicine | Admitting: Internal Medicine

## 2017-01-24 DIAGNOSIS — C50919 Malignant neoplasm of unspecified site of unspecified female breast: Secondary | ICD-10-CM

## 2017-01-24 DIAGNOSIS — Z515 Encounter for palliative care: Secondary | ICD-10-CM

## 2017-01-24 DIAGNOSIS — Z7189 Other specified counseling: Secondary | ICD-10-CM | POA: Insufficient documentation

## 2017-01-24 NOTE — Progress Notes (Signed)
Daily Progress Note   Patient Name: [name]       Date: 01/24/2017 DOB: 1986/05/01  Age: 31 y.o. MRN#: [id number] Attending Physician: Knox Royalty, NP Primary Care Physician: Rulon Eisenmenger, MD Admit Date: 01/24/2017  Reason for Consultation/Follow-up: Establishing goals of care, Non pain symptom management, Pain control and Psychosocial/spiritual support  Subjective:  - meet with Maximino Sarin and her family again today in the OP radiation-oncology clinic, Spanish interpretor, Almyra Free is present  - continued conversation regarding current medical situation and treatment plan being discussed.  Again today the family was confused regarding f/u appointment with oncology.  It is set for tomorrow at 11:30, family thought it was today.    I was very deliberate with this scheduled meeting, writing everything down, clarifying with interpretor; asking them to validate information, I 'm not sure what more to do to help with clear concise information delivery and reception  - today we discussed the appointment  for patient to see Dr Sonny Dandy tomorrow at 11:30  - per radiation oncology information regarding tentative treatment plan is outlined below.  -tentative plans include CT scan for 01-25-17 at 1:45 pm -consult with Dr Cyndy Freeze on 01-27-17 at 11:30 -surgery for Ommaya reservoir 01-31-17 at [facility]  - patient/family has a lot of questions for oncology regarding this treatment plan, risks and benefits, and intention of the treatment .  I encouraged them to write down their questions for Dr Sonny Dandy and be sure they have their questions answered.  Abbie is not sure she wants to move forward with Ommaya treatment plan.   We continued discussing  the seriousness of the situation and worry  for a poor prognosis and the importance of patient and family having all the accurate information in order to make informed decisions.   I shared with patient and her family that Dr Sonny Dandy is working with multidisciplinary team to ensure the best high quality, patient centered care., they questioned if because they don't "have money"  She may be getting sub-optimal care.  Again I assured them and offered emotional support and reinforce their second opinion at [facility] with Dr Wetzel Bjornstad  The difference between a aggressive medical intervention path  and a palliative comfort care path for this patient at this time was had.  Values and goals of care important  to patient and family were attempted to be elicited.  I introduced the idea of home hospice when the time is right and we discussed some legacy ideas for Lexianna to prepare for her children.  Again we discussed resources at [facility] for her children  Length of Stay: 0  Current Medications: Scheduled Meds:    Continuous Infusions:   PRN Meds:   Physical Exam  Constitutional: She appears well-developed. She appears ill.  HENT:  Mouth/Throat: Oropharynx is clear and moist.  Neurological: She is alert.  Skin: Skin is warm and dry.  Nursing note reviewed.           Vital Signs: There were no vitals taken for this visit. SpO2:   O2 Device:   O2 Flow Rate:    Intake/output summary: No intake or output data in the 24 hours ending 01/24/17 1348 LBM:   Baseline Weight:   Most recent weight:         Palliative Assessment/Data: 70 %      Patient Active Problem List   Diagnosis Date Noted  . Metastatic breast cancer ([place])   . Palliative care by specialist   . DNR (do not resuscitate) discussion   . Brain metastases ([place]) 01/04/2017  . Goals of care, counseling/discussion 01/04/2017  . 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 Shingles 05/27/2016  . Genetic testing 09/22/2015  . Lymphedema 07/06/2015  . Arm pain 07/06/2015  . Heat intolerance  07/06/2015  . 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 Vaginal irritation 02/14/2015  . 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 Positive for lymphovascular invasion, extracapsular tumor extension grade 3 ER 53%, PR 30%, HER-2 positive ratio 5.95, Ki-67 33%, T2, N3, M0 stage IIIc with high-grade DCIS status post left mastectomy 09/06/2014; completed adjuvant chemotherapy 01/20/2015 and completed Herceptin maintenance October 2016 and radiation started 02/11/2015 completed 03/25/2015 by Dr. Isidore Moos.   Brain MRI 01/03/2017:Intra-axial mass right inf cerebellum 33 mm, mass left sup cerebellum 25 mm, leptomeningeal carcinomatosis of cerebellum, left trigeminal nerve involvement, mass effect related to the right inferior cerebellar metastases slight right-to-left shift, 6 mm metastases and foramina of Magendie, punctate focus right frontal subcortical white matter  Radiology review:I discussed the case extensively with the Dr. Cyndy Freeze. She is not a candidate for neurosurgery. Unfortunately even a spinal tap can lead to major complications including a 10-20% risk of impingement of the cord. Because of these reasons, we elected not to pursue a lumbar puncture.  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 They requested a second opinion consultation,  and Dr. 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  Recommendations/Plan:  F/U with Dr Sonny Dandy tomorrow for further guidance in how to proceed with treatment   Goals of Care and Additional Recommendations:  Limitations on Scope of Treatment: Full Scope Treatment  Code Status: Code Status History    Date Active Date Inactive Code Status Order ID Comments User  Context   09/06/2014 12:11 PM 09/09/2014  3:09 PM Full Code [id number]  Autumn Messing III, MD Inpatient       Prognosis:   < 6 months   Care plan was discussed with Dr Arnoldo Lenis nurse May  Thank you for allowing the Palliative Medicine Team to assist in the care of this patient.   Will f/u at patient's request   Time In: 0830 Time Out: 0930 Total Time 60- min Prolonged Time Billed  no       Greater than 50%  of this time was spent counseling and coordinating care related to the above assessment and plan.  Wadie Lessen, NP  Please contact Palliative Medicine Team phone at [phone] for questions and concerns.

## 2017-01-25 ENCOUNTER — Ambulatory Visit (HOSPITAL_BASED_OUTPATIENT_CLINIC_OR_DEPARTMENT_OTHER): Payer: Self-pay | Admitting: Hematology and Oncology

## 2017-01-25 ENCOUNTER — Ambulatory Visit (HOSPITAL_COMMUNITY)
Admission: RE | Admit: 2017-01-25 | Discharge: 2017-01-25 | Disposition: A | Discharge status: Home / Self Care | Payer: Self-pay | Source: Ambulatory Visit | Attending: Radiation Oncology | Admitting: Radiation Oncology

## 2017-01-25 ENCOUNTER — Encounter: Payer: Self-pay | Admitting: Hematology and Oncology

## 2017-01-25 DIAGNOSIS — C7931 Secondary malignant neoplasm of brain: Secondary | ICD-10-CM | POA: Insufficient documentation

## 2017-01-25 DIAGNOSIS — C801 Malignant (primary) neoplasm, unspecified: Secondary | ICD-10-CM

## 2017-01-25 DIAGNOSIS — C50512 Malignant neoplasm of lower-outer quadrant of left female breast: Secondary | ICD-10-CM

## 2017-01-25 DIAGNOSIS — Z170 Estrogen receptor positive status [ER+]: Secondary | ICD-10-CM

## 2017-01-25 MED ORDER — DEXAMETHASONE 2 MG PO TABS: 2.0000 mg | ORAL_TABLET | Freq: Two times a day (BID) | ORAL | Status: DC

## 2017-01-25 MED ORDER — OXYCODONE-ACETAMINOPHEN 5-325 MG PO TABS: 1.0000 | ORAL_TABLET | Freq: Four times a day (QID) | ORAL | 0 refills | Status: DC | PRN

## 2017-01-25 MED ORDER — DEXAMETHASONE 2 MG PO TABS: ORAL_TABLET | ORAL | 0 refills | Status: DC

## 2017-01-25 MED FILL — OXYCODONE/APAP 5/325 MG TAB: 5-325 | 15 days supply | Qty: 60 | Fill #0

## 2017-01-25 NOTE — Progress Notes (Signed)
Patient Care Team: Nicholas Lose, MD as PCP - General (Hematology and Oncology)  DIAGNOSIS:  Encounter Diagnoses  Name Primary?  . Malignant neoplasm of lower-outer quadrant of left breast of female, estrogen receptor positive ([place])   . Brain metastases ([place])     SUMMARY OF ONCOLOGIC HISTORY:   Breast cancer of lower-outer quadrant of left female breast ([place])   06/06/2014 Imaging    Palpable Left breast mass at 12:00 position 1.9 cm, initial biopsy 06/12/2014 revealed fibrocystic changes.      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 The genes tested were ATM, BARD1, BRCA1, BRCA2, BRIP1, CDH1, CHEK2, MRE11A, MUTYH, NBN, NF1, PALB2, PTEN, RAD50, RAD51C, RAD51D, and TP53.      10/04/2014 - 01/20/2015 Chemotherapy    Patient was started on adjuvant chemotherapy with Taxotere, Carboplatin, Herceptin, Perjeta followed by Herceptin maintenance for 1 year.        02/11/2015 - 03/25/2015 Radiation Therapy    Adjuvant radiation therapy      05/01/2015 -  Anti-estrogen oral therapy    tamoxifen 20 mg daily      01/03/2017 Imaging    Brain MRI: Intra-axial mass right inf cerebellum 33 mm, mass left sup cerebellum 25 mm, leptomeningeal carcinomatosis of cerebellum, left trigeminal nerve involvement, mass effect related to the right inferior cerebellar metastases slight right-to-left shift, 6 mm metastases and foramina of Magendie, punctate focus right frontal subcortical  white matter      01/05/2017 - 01/19/2017 Radiation Therapy    Whole brain radiation therapy       CHIEF COMPLIANT: Follow-up after whole brain radiation therapy  INTERVAL HISTORY: [name] is a 31 year old with above-mentioned history of left breast cancer who underwent mastectomy followed by adjuvant chemotherapy with [place] followed by Herceptin maintenance. She subsequently underwent radiation therapy and was on tamoxifen when she developed intractable headaches and was found to have cerebellar mass with leptomeningeal carcinomatosis. She was seen by radiation performed followed by radiation therapy. Dr. 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 She has been on oral dexamethasone complains that cause intractable pains in both her legs. It is unclear if the symptoms of the Decadron related due to the spinal involvement with carcinomatous meningitis. She had will be seeing Dr. Wetzel Bjornstad at [facility] for second opinion tomorrow. She will also see a neurosurgeon Dr.Ditty to discuss Omaya reservoir placement. She is here accompanied by her family to discuss overall treatment plan.  REVIEW OF SYSTEMS:   Constitutional: Denies fevers, chills or abnormal weight loss Eyes: Denies blurriness of vision Ears, nose, mouth, throat, and face: Denies mucositis or sore throat Respiratory: Denies cough, dyspnea or wheezes Cardiovascular: Denies palpitation, chest discomfort Gastrointestinal:  Denies nausea, heartburn or change in bowel habits Skin: Denies abnormal skin rashes Lymphatics: Denies new lymphadenopathy or easy bruising Neurological: Headaches, lower extremity pain Behavioral/Psych: Mood is stable, no new changes  Extremities: No lower extremity edema All other  systems were reviewed with the patient and are negative.  I have reviewed the past medical history, past surgical history, social history and family  history with the patient and they are unchanged from previous note.  ALLERGIES:  is allergic to zofran [ondansetron hcl].  MEDICATIONS:  Current Outpatient Prescriptions  Medication Sig Dispense Refill  . acetaminophen (TYLENOL) 500 MG tablet Take 1,000 mg by mouth every 6 (six) hours as needed for headache.    . dexamethasone (DECADRON) 2 MG tablet Take 1 tablet (2 mg total) by mouth 2 (two) times daily.    . diphenhydrAMINE (BENADRYL) 25 MG tablet Take 50 mg by mouth every 6 (six) hours as needed for itching.    Marland Kitchen ibuprofen (ADVIL,MOTRIN) 200 MG tablet Take 400 mg by mouth every 6 (six) hours as needed for headache.    Marland Kitchen LORazepam (ATIVAN) 0.5 MG tablet Take 1 tablet (0.5 mg total) by mouth every 8 (eight) hours. 30 tablet 0  . omeprazole (PRILOSEC) 20 MG capsule Take 1 capsule (20 mg total) by mouth 2 (two) times daily. 60 capsule 0  . oxyCODONE-acetaminophen (PERCOCET/ROXICET) 5-325 MG tablet Take 1 tablet by mouth every 6 (six) hours as needed for severe pain. 60 tablet 0  . tamoxifen (NOLVADEX) 20 MG tablet Take 1 tablet (20 mg total) by mouth daily. 90 tablet 3   No current facility-administered medications for this visit.    Facility-Administered Medications Ordered in Other Visits  Medication Dose Route Frequency Provider Last Rate Last Dose  . sodium chloride 0.9 % injection 10 mL  10 mL Intravenous PRN Nicholas Lose, MD   10 mL at 12/09/14 1632    PHYSICAL EXAMINATION: ECOG PERFORMANCE STATUS: 1 - Symptomatic but completely ambulatory  Vitals:   01/25/17 1123  BP: 112/63  Pulse: 78  Resp: 18  Temp: 97.9 F (36.6 C)   Filed Weights   01/25/17 1123  Weight: 117 lb 14.4 oz (53.5 kg)    GENERAL:alert, no distress and comfortable SKIN: skin color, texture, turgor are normal, no rashes or significant lesions EYES: normal, Conjunctiva are pink and non-injected, sclera clear OROPHARYNX:no exudate, no erythema and lips, buccal mucosa, and tongue normal  NECK: supple,  thyroid normal size, non-tender, without nodularity LYMPH:  no palpable lymphadenopathy in the cervical, axillary or inguinal LUNGS: clear to auscultation and percussion with normal breathing effort HEART: regular rate & rhythm and no murmurs and no lower extremity edema ABDOMEN:abdomen soft, non-tender and normal bowel sounds MUSCULOSKELETAL:no cyanosis of digits and no clubbing  NEURO: alert & oriented x 3 with fluent speech, no focal motor/sensory deficits EXTREMITIES: No lower extremity edema  LABORATORY DATA:  I have reviewed the data as listed   Chemistry      Component Value Date/Time   NA 139 12/03/2016 1050   K 3.9 12/03/2016 1050   CL 108 05/09/2016 1911   CO2 25 12/03/2016 1050   BUN 9.7 12/03/2016 1050   CREATININE 0.7 12/03/2016 1050      Component Value Date/Time   CALCIUM 9.3 12/03/2016 1050   ALKPHOS 96 12/03/2016 1050   AST 15 12/03/2016 1050   ALT 10 12/03/2016 1050   BILITOT 0.46 12/03/2016 1050       Lab Results  Component Value Date   WBC 6.5 12/03/2016   HGB 11.2 (L) 12/03/2016   HCT 35.6 12/03/2016   MCV 64.0 (L) 12/03/2016   PLT 231 12/03/2016   NEUTROABS 3.8 12/03/2016    ASSESSMENT & PLAN:  Breast cancer of  lower-outer quadrant of left female breast ([place]) 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 Positive for lymphovascular invasion, extracapsular tumor extension grade 3 ER 53%, PR 30%, HER-2 positive ratio 5.95, Ki-67 33%, T2, N3, M0 stage IIIc with high-grade DCIS status post left mastectomy 09/06/2014; completed adjuvant chemotherapy 01/20/2015 and completed Herceptin maintenance October 2016 and radiation started 02/11/2015 completed 03/25/2015 by Dr. Isidore Moos. She is on tamoxifen 20 mg daily.  Brain MRI 01/03/2017: Intra-axial mass right inf cerebellum 33 mm, mass left sup cerebellum 25 mm, leptomeningeal carcinomatosis of cerebellum, left  trigeminal nerve involvement, mass effect related to the right inferior cerebellar metastases slight right-to-left shift, 6 mm metastases and foramina of Magendie, punctate focus right frontal subcortical white matter  CT CAP 01/03/2017: No evidence of distant metastatic disease beyond the CNS Prognosis: Patient understands that metastatic breast cancer cannot be cured. The goals of treatment are palliation. 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 Zoladex with letrozole and lapatinib palliative treatment. 2. Continue dexamethasone 2 mg twice a day for another week then go to 2 mg once a day for another week and then discontinue 3. Followed by intrathecal depocytarabine   We are extremely concerned about the spinal leptomeningeal carcinomatosis. We discussed the pros and cons of spondylitic radiation and determined that it would be more toxic without any major benefit. We instead determined that she would benefit from intrathecal chemotherapy.  Patient is seeking a second opinion at [facility]. Return to clinic in 2 weeks to discuss the final treatment plan and to plan for intrathecal injections.  I spent 45 minutes talking to the patient of which more than half was spent in counseling and coordination of care.  Orders Placed This Encounter  Procedures  . CBC with Differential    Standing Status:   Future    Standing Expiration Date:   01/25/2018  . Comprehensive metabolic panel    Standing Status:   Future    Standing Expiration Date:   01/25/2018   The patient has a good understanding of the overall plan. she agrees with it. she will call with any problems that may develop before the next visit here.   Rulon Eisenmenger, MD 01/25/17

## 2017-01-25 NOTE — Assessment & Plan Note (Signed)
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 Positive for lymphovascular invasion, extracapsular tumor extension grade 3 ER 53%, PR 30%, HER-2 positive ratio 5.95, Ki-67 33%, T2, N3, M0 stage IIIc with high-grade DCIS status post left mastectomy 09/06/2014; completed adjuvant chemotherapy 01/20/2015 and completed Herceptin maintenance October 2016 and radiation started 02/11/2015 completed 03/25/2015 by Dr. Isidore Moos. She is on tamoxifen 20 mg daily.  Brain MRI 01/03/2017: Intra-axial mass right inf cerebellum 33 mm, mass left sup cerebellum 25 mm, leptomeningeal carcinomatosis of cerebellum, left trigeminal nerve involvement, mass effect related to the right inferior cerebellar metastases slight right-to-left shift, 6 mm metastases and foramina of Magendie, punctate focus right frontal subcortical white matter  CT CAP 01/03/2017: No evidence of distant metastatic disease beyond the CNS Prognosis: Patient understands that metastatic breast cancer cannot be cured. The goals of treatment are palliation. 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 Zoladex with letrozole and lapatinib palliative treatment. 2. Continue dexamethasone 2 mg twice a day 3. Followed by intrathecal depocytarabine   We are extremely concerned about the spinal leptomeningeal carcinomatosis. We discussed the pros and cons of spondylitic radiation and determined that it would be more toxic without any major benefit. We instead determined that she would benefit from intrathecal chemotherapy.  Patient is seeking a second opinion at [facility].

## 2017-01-28 ENCOUNTER — Encounter (HOSPITAL_COMMUNITY): Payer: Self-pay

## 2017-01-28 ENCOUNTER — Encounter (HOSPITAL_COMMUNITY)
Admission: RE | Admit: 2017-01-28 | Discharge: 2017-01-28 | Disposition: A | Discharge status: Home / Self Care | Payer: Self-pay | Source: Ambulatory Visit | Attending: Neurological Surgery | Admitting: Neurological Surgery

## 2017-01-28 DIAGNOSIS — G939 Disorder of brain, unspecified: Secondary | ICD-10-CM | POA: Insufficient documentation

## 2017-01-28 DIAGNOSIS — Z01812 Encounter for preprocedural laboratory examination: Secondary | ICD-10-CM | POA: Insufficient documentation

## 2017-01-28 HISTORY — DX: Headache: R51

## 2017-01-28 HISTORY — DX: Headache, unspecified: R51.9

## 2017-01-28 LAB — CBC
HEMATOCRIT: 38.1 % (ref 36.0–46.0)
HEMOGLOBIN: 12.2 g/dL (ref 12.0–15.0)
MCH: 21 pg — ABNORMAL LOW (ref 26.0–34.0)
MCHC: 32 g/dL (ref 30.0–36.0)
MCV: 65.6 fL — ABNORMAL LOW (ref 78.0–100.0)
Platelets: 165 10*3/uL (ref 150–400)
RBC: 5.81 MIL/uL — ABNORMAL HIGH (ref 3.87–5.11)
RDW: 20.1 % — AB (ref 11.5–15.5)
WBC: 9.2 10*3/uL (ref 4.0–10.5)

## 2017-01-28 LAB — BASIC METABOLIC PANEL
Anion gap: 6 (ref 5–15)
BUN: 16 mg/dL (ref 6–20)
CO2: 27 mmol/L (ref 22–32)
Calcium: 8.9 mg/dL (ref 8.9–10.3)
Chloride: 102 mmol/L (ref 101–111)
Creatinine, Ser: 0.53 mg/dL (ref 0.44–1.00)
GFR calc Af Amer: >60 mL/min (ref >60)
Glucose, Bld: 102 mg/dL — ABNORMAL HIGH (ref 65–99)
POTASSIUM: 4.5 mmol/L (ref 3.5–5.1)
Sodium: 135 mmol/L (ref 135–145)

## 2017-01-28 LAB — SURGICAL PCR SCREEN
MRSA, PCR: NEGATIVE
STAPHYLOCOCCUS AUREUS: NEGATIVE

## 2017-01-28 LAB — HCG, SERUM, QUALITATIVE: PREG SERUM: NEGATIVE

## 2017-01-28 NOTE — Pre-Procedure Instructions (Addendum)
[name]  01/28/2017      [facility] [place], [place] [address] Phone: [phone] Fax: [phone]  [place] [address] [facility], [place] [address] [place] [address] Phone: [phone] Fax: [phone]  [place] [address]), [place] [address] [place] [address] Phone: [phone] Fax: [place], [place] [address] Phone: [phone] Fax: [phone]  [id number] [place], [address] [place] [address] Phone: [phone] Fax: [phone]    Instrucciones Para Antes de la Ciruga   Su ciruga est programada para-(your procedure is scheduled on)02/01/17   Entre Day Surgery at 1:45 pm (enter) at the [facility]	[place] and go to admitting    Por favor llame al [phone]  if any                 Recuerde:  (Remember)   No coma alimentos ni tome lquidos, incluyendo agua, despus de la medianoche del  (Do not eat food or drink liquids including water after midnight on__3/19/18_____________   Hermelinda Dellen medicinas en la manaa de la ciruga con un SORBITO de agua (take these meds the morning of surgery with a SIP of water)_,omeprazole(prilosec),dexametasonelorazepam(ativan) if needed, oxycodone if needed  STOP all herbel meds, nsaids (aleve,naproxen,advil,ibuprofen)  Starting today including all vitamins/supplements,aspirin   Puede cepillarse los dientes en la maana de la ciruga. (you may brush your teeth the morning of surgery)   No use joyas, maquillaje de ojos, lpiz labial, crema para el cuerpo o esmalte de uas oscuro.  (Do not wear jewelry, eye makeup, lipstick, body lotion, or dark fingernail polish)   No puede usar desodorante. (you  may wear deodorant)   Si va a ser ingresado despus de la ciruga, deje la maleta en el carro hasta que se le haya asignado una habitacin. (If you are to be admitted after surgery, leave suitcase in car until your room has been assigned.)   A los pacientes que se les d de alta el mismo da no se les permitir manejar a casa.  (Patients discharged on the day of surgery will not be allowed to drive home)   Use ropa suelta y cmoda de regreso a casa. (wear loose comfortable clothes for ride home)    [name] (patient signature) ______________________________________     [facility] is not responsible for any belongings or valuables.  Contacts, dentures or bridgework may not be worn into surgery.  Leave your suitcase in the car.  After surgery it may be brought to your room.  For patients admitted to the hospital, discharge time will be determined by your treatment team.  Special instructions:  Special Instructions: [place] - Preparing for Surgery  Before surgery, you can play an important role.  Because skin is not sterile, your skin needs to be as free of germs as possible.  You can reduce the number of germs on you skin by washing with CHG (chlorahexidine gluconate) soap before surgery.  CHG is an antiseptic cleaner which kills germs and bonds with the skin to continue killing germs even after washing.  Please DO NOT use if you have an allergy to CHG or antibacterial soaps.  If your skin becomes reddened/irritated stop using the CHG and  inform your nurse when you arrive at Short Stay.  Do not shave (including legs and underarms) for at least 48 hours prior to the first CHG shower.  You may shave your face.  Please follow these instructions carefully:   1.  Shower with CHG Soap the night before surgery and the morning of Surgery.  2.  If you choose to wash your hair, wash your hair first as usual with your normal shampoo.  3.  After you shampoo, rinse your hair and body  thoroughly to remove the Shampoo.  4.  Use CHG as you would any other liquid soap.  You can apply chg directly  to the skin and wash gently with scrungie or a clean washcloth.  5.  Apply the CHG Soap to your body ONLY FROM THE NECK DOWN.  Do not use on open wounds or open sores.  Avoid contact with your eyes ears, mouth and genitals (private parts).  Wash genitals (private parts)       with your normal soap.  6.  Wash thoroughly, paying special attention to the area where your surgery will be performed.  7.  Thoroughly rinse your body with warm water from the neck down.  8.  DO NOT shower/wash with your normal soap after using and rinsing off the CHG Soap.  9.  Pat yourself dry with a clean towel.            10.  Wear clean pajamas.            11.  Place clean sheets on your bed the night of your first shower and do not sleep with pets.  Day of Surgery  Do not apply any lotions/deodorants the morning of surgery.  Please wear clean clothes to the hospital/surgery center.  Please read over the fact sheets that you were given.

## 2017-02-01 ENCOUNTER — Encounter (HOSPITAL_COMMUNITY): Payer: Self-pay | Admitting: *Deleted

## 2017-02-01 ENCOUNTER — Inpatient Hospital Stay (HOSPITAL_COMMUNITY)
Admission: RE | Admit: 2017-02-01 | Discharge: 2017-02-02 | DRG: 026 | Disposition: A | Discharge status: Home / Self Care | Payer: Self-pay | Source: Ambulatory Visit | Attending: Neurological Surgery | Admitting: Neurological Surgery

## 2017-02-01 ENCOUNTER — Inpatient Hospital Stay (HOSPITAL_COMMUNITY): Payer: Self-pay | Admitting: Certified Registered Nurse Anesthetist

## 2017-02-01 ENCOUNTER — Encounter (HOSPITAL_COMMUNITY)
Admission: RE | Disposition: A | Discharge status: Home / Self Care | Payer: Self-pay | Source: Ambulatory Visit | Attending: Neurological Surgery

## 2017-02-01 ENCOUNTER — Inpatient Hospital Stay (HOSPITAL_COMMUNITY): Payer: Self-pay

## 2017-02-01 DIAGNOSIS — G939 Disorder of brain, unspecified: Principal | ICD-10-CM | POA: Diagnosis present

## 2017-02-01 DIAGNOSIS — Z95828 Presence of other vascular implants and grafts: Secondary | ICD-10-CM

## 2017-02-01 DIAGNOSIS — C701 Malignant neoplasm of spinal meninges: Secondary | ICD-10-CM | POA: Diagnosis present

## 2017-02-01 DIAGNOSIS — Z853 Personal history of malignant neoplasm of breast: Secondary | ICD-10-CM

## 2017-02-01 DIAGNOSIS — Z888 Allergy status to other drugs, medicaments and biological substances status: Secondary | ICD-10-CM

## 2017-02-01 DIAGNOSIS — Z9889 Other specified postprocedural states: Secondary | ICD-10-CM

## 2017-02-01 DIAGNOSIS — Z7952 Long term (current) use of systemic steroids: Secondary | ICD-10-CM

## 2017-02-01 DIAGNOSIS — I959 Hypotension, unspecified: Secondary | ICD-10-CM | POA: Diagnosis present

## 2017-02-01 DIAGNOSIS — Z9221 Personal history of antineoplastic chemotherapy: Secondary | ICD-10-CM

## 2017-02-01 DIAGNOSIS — C7949 Secondary malignant neoplasm of other parts of nervous system: Secondary | ICD-10-CM | POA: Diagnosis present

## 2017-02-01 HISTORY — PX: APPLICATION OF CRANIAL NAVIGATION: SHX6578

## 2017-02-01 HISTORY — PX: CHEMO RESERVIOR INSERTION: SHX2099

## 2017-02-01 LAB — CBC
HCT: 34.8 % — ABNORMAL LOW (ref 36.0–46.0)
Hemoglobin: 11 g/dL — ABNORMAL LOW (ref 12.0–15.0)
MCH: 20.8 pg — ABNORMAL LOW (ref 26.0–34.0)
MCHC: 31.6 g/dL (ref 30.0–36.0)
MCV: 65.9 fL — ABNORMAL LOW (ref 78.0–100.0)
Platelets: 153 10*3/uL (ref 150–400)
RBC: 5.28 MIL/uL — ABNORMAL HIGH (ref 3.87–5.11)
RDW: 19.8 % — ABNORMAL HIGH (ref 11.5–15.5)
WBC: 12 10*3/uL — ABNORMAL HIGH (ref 4.0–10.5)

## 2017-02-01 LAB — BASIC METABOLIC PANEL
Anion gap: 7 (ref 5–15)
BUN: 10 mg/dL (ref 6–20)
CO2: 30 mmol/L (ref 22–32)
Calcium: 8.6 mg/dL — ABNORMAL LOW (ref 8.9–10.3)
Chloride: 102 mmol/L (ref 101–111)
Creatinine, Ser: 0.63 mg/dL (ref 0.44–1.00)
GFR calc Af Amer: >60 mL/min (ref >60)
GFR calc non Af Amer: >60 mL/min (ref >60)
Glucose, Bld: 90 mg/dL (ref 65–99)
Potassium: 3.8 mmol/L (ref 3.5–5.1)
Sodium: 139 mmol/L (ref 135–145)

## 2017-02-01 SURGERY — CHEMO RESERVOIR INSERTION
Anesthesia: General

## 2017-02-01 MED ORDER — CHLORHEXIDINE GLUCONATE CLOTH 2 % EX PADS: 6.0000 | MEDICATED_PAD | Freq: Once | CUTANEOUS | Status: DC

## 2017-02-01 MED ORDER — HYDROMORPHONE HCL 1 MG/ML IJ SOLN
0.5000 mg | INTRAMUSCULAR | Status: DC | PRN
  Administered 2017-02-01: 0.5 mg via INTRAVENOUS
  Filled 2017-02-01: qty 1

## 2017-02-01 MED ORDER — PROMETHAZINE HCL 25 MG/ML IJ SOLN
6.2500 mg | INTRAMUSCULAR | Status: DC | PRN
Start: 2017-02-01 — End: 2017-02-01

## 2017-02-01 MED ORDER — FENTANYL CITRATE (PF) 100 MCG/2ML IJ SOLN
INTRAMUSCULAR | Status: AC
  Filled 2017-02-01: qty 4

## 2017-02-01 MED ORDER — OXYCODONE HCL 5 MG/5ML PO SOLN: 5.0000 mg | Freq: Once | ORAL | Status: DC | PRN

## 2017-02-01 MED ORDER — FENTANYL CITRATE (PF) 100 MCG/2ML IJ SOLN
INTRAMUSCULAR | Status: DC | PRN
  Administered 2017-02-01 (×2): 100 ug via INTRAVENOUS

## 2017-02-01 MED ORDER — MAGNESIUM CITRATE PO SOLN: 1.0000 | Freq: Once | ORAL | Status: DC | PRN

## 2017-02-01 MED ORDER — DEXAMETHASONE SODIUM PHOSPHATE 10 MG/ML IJ SOLN
INTRAMUSCULAR | Status: DC | PRN
  Administered 2017-02-01: 10 mg via INTRAVENOUS

## 2017-02-01 MED ORDER — PANTOPRAZOLE SODIUM 40 MG IV SOLR
40.0000 mg | Freq: Every day | INTRAVENOUS | Status: DC
  Administered 2017-02-01: 40 mg via INTRAVENOUS
  Filled 2017-02-01: qty 40

## 2017-02-01 MED ORDER — HYDROMORPHONE HCL 1 MG/ML IJ SOLN: 0.2500 mg | INTRAMUSCULAR | Status: DC | PRN

## 2017-02-01 MED ORDER — PHENYLEPHRINE HCL 10 MG/ML IJ SOLN
INTRAMUSCULAR | Status: DC | PRN
  Administered 2017-02-01: 120 ug via INTRAVENOUS
  Administered 2017-02-01: 200 ug via INTRAVENOUS
  Administered 2017-02-01: 120 ug via INTRAVENOUS

## 2017-02-01 MED ORDER — LIDOCAINE-EPINEPHRINE (PF) 2 %-1:200000 IJ SOLN
INTRAMUSCULAR | Status: DC | PRN
  Administered 2017-02-01: 7.5 mL

## 2017-02-01 MED ORDER — POLYETHYLENE GLYCOL 3350 17 G PO PACK: 17.0000 g | PACK | Freq: Every day | ORAL | Status: DC | PRN

## 2017-02-01 MED ORDER — SODIUM CHLORIDE 0.9 % IV SOLN
500.0000 mg | Freq: Two times a day (BID) | INTRAVENOUS | Status: DC
  Administered 2017-02-01 – 2017-02-02 (×2): 500 mg via INTRAVENOUS
  Filled 2017-02-01 (×3): qty 5

## 2017-02-01 MED ORDER — DEXAMETHASONE SODIUM PHOSPHATE 10 MG/ML IJ SOLN
INTRAMUSCULAR | Status: AC
  Filled 2017-02-01: qty 1

## 2017-02-01 MED ORDER — PROMETHAZINE HCL 25 MG PO TABS
12.5000 mg | ORAL_TABLET | ORAL | Status: DC | PRN
  Administered 2017-02-01: 25 mg via ORAL
  Filled 2017-02-01: qty 1

## 2017-02-01 MED ORDER — ROCURONIUM BROMIDE 100 MG/10ML IV SOLN
INTRAVENOUS | Status: DC | PRN
  Administered 2017-02-01: 50 mg via INTRAVENOUS

## 2017-02-01 MED ORDER — BUPIVACAINE HCL (PF) 0.5 % IJ SOLN
INTRAMUSCULAR | Status: AC
  Filled 2017-02-01: qty 30

## 2017-02-01 MED ORDER — CEFAZOLIN SODIUM-DEXTROSE 2-4 GM/100ML-% IV SOLN
2.0000 g | INTRAVENOUS | Status: AC
  Administered 2017-02-01: 2 g via INTRAVENOUS

## 2017-02-01 MED ORDER — BACITRACIN ZINC 500 UNIT/GM EX OINT
TOPICAL_OINTMENT | CUTANEOUS | Status: DC | PRN
  Administered 2017-02-01: 1 via TOPICAL

## 2017-02-01 MED ORDER — LIDOCAINE HCL (CARDIAC) 20 MG/ML IV SOLN
INTRAVENOUS | Status: DC | PRN
  Administered 2017-02-01: 60 mg via INTRAVENOUS

## 2017-02-01 MED ORDER — 0.9 % SODIUM CHLORIDE (POUR BTL) OPTIME
TOPICAL | Status: DC | PRN
  Administered 2017-02-01: 1000 mL

## 2017-02-01 MED ORDER — MIDAZOLAM HCL 2 MG/2ML IJ SOLN
INTRAMUSCULAR | Status: AC
  Filled 2017-02-01: qty 2

## 2017-02-01 MED ORDER — PROPOFOL 10 MG/ML IV BOLUS
INTRAVENOUS | Status: AC
  Filled 2017-02-01: qty 20

## 2017-02-01 MED ORDER — OXYCODONE HCL 5 MG PO TABS: 5.0000 mg | ORAL_TABLET | Freq: Once | ORAL | Status: DC | PRN

## 2017-02-01 MED ORDER — PHENYLEPHRINE HCL 10 MG/ML IJ SOLN
INTRAVENOUS | Status: DC | PRN
  Administered 2017-02-01: 50 ug/min via INTRAVENOUS

## 2017-02-01 MED ORDER — ACETAMINOPHEN 650 MG RE SUPP: 650.0000 mg | RECTAL | Status: DC | PRN

## 2017-02-01 MED ORDER — LABETALOL HCL 5 MG/ML IV SOLN: 10.0000 mg | INTRAVENOUS | Status: DC | PRN

## 2017-02-01 MED ORDER — SODIUM CHLORIDE 0.9 % IV BOLUS (SEPSIS)
500.0000 mL | Freq: Once | INTRAVENOUS | Status: AC
  Administered 2017-02-01: 500 mL via INTRAVENOUS

## 2017-02-01 MED ORDER — SODIUM CHLORIDE 0.9 % IV SOLN
INTRAVENOUS | Status: DC
  Administered 2017-02-01: 19:00:00 via INTRAVENOUS

## 2017-02-01 MED ORDER — CEFAZOLIN SODIUM-DEXTROSE 2-4 GM/100ML-% IV SOLN
INTRAVENOUS | Status: AC
  Filled 2017-02-01: qty 100

## 2017-02-01 MED ORDER — ACETAMINOPHEN 325 MG PO TABS: 650.0000 mg | ORAL_TABLET | ORAL | Status: DC | PRN

## 2017-02-01 MED ORDER — BUPIVACAINE-EPINEPHRINE (PF) 0.5% -1:200000 IJ SOLN
INTRAMUSCULAR | Status: DC | PRN
  Administered 2017-02-01: 7.5 mL

## 2017-02-01 MED ORDER — LACTATED RINGERS IV SOLN
INTRAVENOUS | Status: DC
  Administered 2017-02-01 (×2): via INTRAVENOUS

## 2017-02-01 MED ORDER — SUGAMMADEX SODIUM 200 MG/2ML IV SOLN
INTRAVENOUS | Status: AC
  Filled 2017-02-01: qty 2

## 2017-02-01 MED ORDER — NALOXONE HCL 0.4 MG/ML IJ SOLN: 0.0800 mg | INTRAMUSCULAR | Status: DC | PRN

## 2017-02-01 MED ORDER — THROMBIN 5000 UNITS EX SOLR
CUTANEOUS | Status: DC | PRN
  Administered 2017-02-01: 5000 [IU] via TOPICAL

## 2017-02-01 MED ORDER — HEMOSTATIC AGENTS (NO CHARGE) OPTIME
TOPICAL | Status: DC | PRN
  Administered 2017-02-01: 1 via TOPICAL

## 2017-02-01 MED ORDER — PROPOFOL 10 MG/ML IV BOLUS
INTRAVENOUS | Status: DC | PRN
  Administered 2017-02-01: 160 mg via INTRAVENOUS
  Administered 2017-02-01: 40 mg via INTRAVENOUS

## 2017-02-01 MED ORDER — SODIUM CHLORIDE 0.9 % IR SOLN
Status: DC | PRN
  Administered 2017-02-01: 16:00:00

## 2017-02-01 MED ORDER — THROMBIN 5000 UNITS EX SOLR
CUTANEOUS | Status: AC
  Filled 2017-02-01: qty 5000

## 2017-02-01 MED ORDER — BACITRACIN ZINC 500 UNIT/GM EX OINT
TOPICAL_OINTMENT | CUTANEOUS | Status: AC
  Filled 2017-02-01: qty 28.35

## 2017-02-01 MED ORDER — LIDOCAINE-EPINEPHRINE (PF) 2 %-1:200000 IJ SOLN
INTRAMUSCULAR | Status: AC
  Filled 2017-02-01: qty 20

## 2017-02-01 MED ORDER — HYDROCODONE-ACETAMINOPHEN 5-325 MG PO TABS
1.0000 | ORAL_TABLET | ORAL | Status: DC | PRN
  Administered 2017-02-02 (×3): 1 via ORAL
  Filled 2017-02-01 (×3): qty 1

## 2017-02-01 MED ORDER — BISACODYL 10 MG RE SUPP: 10.0000 mg | Freq: Every day | RECTAL | Status: DC | PRN

## 2017-02-01 MED ORDER — BUPIVACAINE-EPINEPHRINE (PF) 0.5% -1:200000 IJ SOLN
INTRAMUSCULAR | Status: AC
  Filled 2017-02-01: qty 30

## 2017-02-01 MED ORDER — DOCUSATE SODIUM 100 MG PO CAPS
100.0000 mg | ORAL_CAPSULE | Freq: Two times a day (BID) | ORAL | Status: DC
  Administered 2017-02-01 – 2017-02-02 (×2): 100 mg via ORAL
  Filled 2017-02-01 (×2): qty 1

## 2017-02-01 MED ORDER — MIDAZOLAM HCL 5 MG/5ML IJ SOLN
INTRAMUSCULAR | Status: DC | PRN
  Administered 2017-02-01: 2 mg via INTRAVENOUS

## 2017-02-01 MED ORDER — SUGAMMADEX SODIUM 200 MG/2ML IV SOLN
INTRAVENOUS | Status: DC | PRN
  Administered 2017-02-01: 110 mg via INTRAVENOUS

## 2017-02-01 SURGICAL SUPPLY — 63 items
BLADE CLIPPER SURG (BLADE) ×6 IMPLANT
BLADE SURG 11 STRL SS (BLADE) ×3 IMPLANT
BOOT SUTURE AID YELLOW STND (SUTURE) ×3 IMPLANT
BUR ACORN 6.0 PRECISION (BURR) ×2 IMPLANT
BUR ACORN 6.0MM PRECISION (BURR) ×1
CANISTER SUCT 3000ML PPV (MISCELLANEOUS) ×3 IMPLANT
CARTRIDGE OIL MAESTRO DRILL (MISCELLANEOUS) ×2 IMPLANT
CLIP RANEY DISP (INSTRUMENTS) ×6 IMPLANT
CLOSURE WOUND 1/2 X4 (GAUZE/BANDAGES/DRESSINGS)
DIFFUSER DRILL AIR PNEUMATIC (MISCELLANEOUS) ×6 IMPLANT
DRAPE HALF SHEET 40X57 (DRAPES) ×3 IMPLANT
DRAPE ORTHO SPLIT 77X108 STRL (DRAPES) ×4
DRAPE SURG ORHT 6 SPLT 77X108 (DRAPES) ×2 IMPLANT
DRSG MEPILEX BORDER 4X4 (GAUZE/BANDAGES/DRESSINGS) ×3 IMPLANT
DURAPREP 26ML APPLICATOR (WOUND CARE) ×6 IMPLANT
ELECT REM PT RETURN 9FT ADLT (ELECTROSURGICAL) ×3
ELECTRODE REM PT RTRN 9FT ADLT (ELECTROSURGICAL) ×1 IMPLANT
GAUZE SPONGE 4X4 16PLY XRAY LF (GAUZE/BANDAGES/DRESSINGS) IMPLANT
GLOVE BIO SURGEON STRL SZ7 (GLOVE) ×3 IMPLANT
GLOVE BIO SURGEON STRL SZ7.5 (GLOVE) ×3 IMPLANT
GLOVE BIOGEL PI IND STRL 7.0 (GLOVE) ×2 IMPLANT
GLOVE BIOGEL PI IND STRL 7.5 (GLOVE) ×2 IMPLANT
GLOVE BIOGEL PI IND STRL 8 (GLOVE) ×2 IMPLANT
GLOVE BIOGEL PI INDICATOR 7.0 (GLOVE) ×4
GLOVE BIOGEL PI INDICATOR 7.5 (GLOVE) ×4
GLOVE BIOGEL PI INDICATOR 8 (GLOVE) ×4
GLOVE ECLIPSE 7.0 STRL STRAW (GLOVE) ×3 IMPLANT
GLOVE INDICATOR 7.5 STRL GRN (GLOVE) IMPLANT
GOWN STRL REUS W/ TWL LRG LVL3 (GOWN DISPOSABLE) ×2 IMPLANT
GOWN STRL REUS W/ TWL XL LVL3 (GOWN DISPOSABLE) ×2 IMPLANT
GOWN STRL REUS W/TWL 2XL LVL3 (GOWN DISPOSABLE) IMPLANT
GOWN STRL REUS W/TWL LRG LVL3 (GOWN DISPOSABLE) ×4
GOWN STRL REUS W/TWL XL LVL3 (GOWN DISPOSABLE) ×4
KIT BASIN OR (CUSTOM PROCEDURE TRAY) ×3 IMPLANT
KIT ROOM TURNOVER OR (KITS) ×3 IMPLANT
MARKER SKIN DUAL TIP RULER LAB (MISCELLANEOUS) ×3 IMPLANT
MARKER SPHERE PSV REFLC 13MM (MARKER) ×6 IMPLANT
NEEDLE HYPO 21X1.5 SAFETY (NEEDLE) ×6 IMPLANT
NEEDLE HYPO 25X1 1.5 SAFETY (NEEDLE) ×3 IMPLANT
NS IRRIG 1000ML POUR BTL (IV SOLUTION) ×3 IMPLANT
OIL CARTRIDGE MAESTRO DRILL (MISCELLANEOUS) ×6
OMMAYA CSF RESV SIDE INLET (Shunt) ×3 IMPLANT
PACK LAMINECTOMY NEURO (CUSTOM PROCEDURE TRAY) ×3 IMPLANT
PACK UNIVERSAL I (CUSTOM PROCEDURE TRAY) ×3 IMPLANT
PAD ARMBOARD 7.5X6 YLW CONV (MISCELLANEOUS) ×9 IMPLANT
RESERVOIR OMMAYA CSF SIDE INLT (Shunt) ×1 IMPLANT
SPONGE LAP 4X18 X RAY DECT (DISPOSABLE) ×3 IMPLANT
SPONGE SURGIFOAM ABS GEL 12-7 (HEMOSTASIS) IMPLANT
STAPLER VISISTAT 35W (STAPLE) ×6 IMPLANT
STRIP CLOSURE SKIN 1/2X4 (GAUZE/BANDAGES/DRESSINGS) IMPLANT
SUT ETHILON 3 0 PS 1 (SUTURE) ×3 IMPLANT
SUT NURALON 4 0 TR CR/8 (SUTURE) IMPLANT
SUT SILK 2 0 (SUTURE) ×2
SUT SILK 2-0 18XBRD TIE 12 (SUTURE) ×1 IMPLANT
SUT SILK 2-0 PERMAHAND BK 2X60 (SUTURE) ×2
SUT SILK 3 0 SH 30 (SUTURE) IMPLANT
SUT VIC AB 2-0 CT2 18 VCP726D (SUTURE) ×3 IMPLANT
SUTURE SILK 2-0 PRMHND BK 2X60 (SUTURE) ×1 IMPLANT
SYR 30ML LL (SYRINGE) ×3 IMPLANT
TOWEL GREEN STERILE (TOWEL DISPOSABLE) ×3 IMPLANT
TOWEL GREEN STERILE FF (TOWEL DISPOSABLE) ×2 IMPLANT
UNDERPAD 30X30 (UNDERPADS AND DIAPERS) ×3 IMPLANT
WATER STERILE IRR 1000ML POUR (IV SOLUTION) ×3 IMPLANT

## 2017-02-01 NOTE — H&P (Signed)
CC:  No chief complaint on file. Leptomeningeal carcinomatosis.  HPI: [name] is a 31 y.o. 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  She presents for placement of an Ommaya reservoir for intrathecal chemotherapy.  PMH: Past Medical History:  Diagnosis Date  . Anemia    hx  . Broken tooth 12/01/2015   left upper back  . Cancer ([place])   . Headache   . History of breast cancer 2015   left  . History of chemotherapy    finished 07/2015  . Multiple blisters 11/2015   tongue - states from chemo    PSH: Past Surgical History:  Procedure Laterality Date  . BREAST LUMPECTOMY Left 07/10/2014   Procedure: LEFT BREAST LUMPECTOMY;  Surgeon: Merrie Roof, MD;  Location: [place];  Service: General;  Laterality: Left;  . CESAREAN SECTION  09/09/2007  . MASTECTOMY W/ SENTINEL NODE BIOPSY Left 09/06/2014   Procedure: LEFT MASTECTOMY WITH SENTINEL LYMPH NODE BIOPSY/NODE MAPPING;  Surgeon: Autumn Messing III, MD;  Location: [place];  Service: General;  Laterality: Left;  . PORT-A-CATH REMOVAL N/A 12/08/2015   Procedure: REMOVAL PORT-A-CATH;  Surgeon: Autumn Messing III, MD;  Location: [place];  Service: General;  Laterality: N/A;  . PORTACATH PLACEMENT Right 09/06/2014   Procedure: INSERTION PORT-A-CATH;  Surgeon: Autumn Messing III, MD;  Location: [place];  Service: General;  Laterality: Right;    SH: Social History  Substance Use Topics  . Smoking status: Never Smoker  . Smokeless tobacco: Never Used  . Alcohol use No    MEDS: Prior to Admission medications   Medication Sig Start Date End Date Taking? Authorizing Provider  acetaminophen (TYLENOL) 500 MG tablet Take 1,000 mg by mouth every 6 (six) hours as needed for headache.   Yes Historical Provider, MD  dexamethasone (DECADRON) 2 MG tablet Take 1 tablet (2 mg total) by mouth 2 (two) times daily. 01/25/17  Yes Nicholas Lose, MD  diphenhydrAMINE (BENADRYL) 25 MG tablet  Take 25-50 mg by mouth every 6 (six) hours as needed for itching or allergies.    Yes Historical Provider, MD  LORazepam (ATIVAN) 0.5 MG tablet Take 1 tablet (0.5 mg total) by mouth every 8 (eight) hours. Patient taking differently: Take 0.5 mg by mouth every 8 (eight) hours as needed for anxiety or sleep.  01/04/17  Yes Nicholas Lose, MD  omeprazole (PRILOSEC) 20 MG capsule Take 1 capsule (20 mg total) by mouth 2 (two) times daily. 01/03/17  Yes Tanna Furry, MD  oxyCODONE-acetaminophen (PERCOCET/ROXICET) 5-325 MG tablet Take 1 tablet by mouth every 6 (six) hours as needed for severe pain. 01/25/17  Yes Nicholas Lose, MD  tamoxifen (NOLVADEX) 20 MG tablet Take 1 tablet (20 mg total) by mouth daily. 12/03/16  Yes Nicholas Lose, MD    ALLERGY: Allergies  Allergen Reactions  . Zofran [Ondansetron Hcl] Itching    ROS: ROS  NEUROLOGIC EXAM: Awake, alert, oriented Memory and concentration grossly intact Speech fluent, appropriate CN grossly intact Motor exam: Upper Extremities Deltoid Bicep Tricep Grip  Right 5/5 5/5 5/5 5/5  Left 5/5 5/5 5/5 5/5   Lower Extremity IP Quad PF DF EHL  Right 5/5 5/5 5/5 5/5 5/5  Left 5/5 5/5 5/5 5/5 5/5   Sensation grossly intact to LT  IMAGING: No new imaging  IMPRESSION: - 31 y.o. female with leptomeningeal carcinomatosis.  PLAN: - Placement of Ommaya reservoir - Risks, benefits, and alternatives have been discussed.

## 2017-02-01 NOTE — Progress Notes (Signed)
eLink Physician-Brief Progress Note Patient Name: Yarisa Lynam Rodriguez-Fuentes DOB: 09-Sep-1986 MRN: [id number]   Date of Service  02/01/2017  HPI/Events of Note  Post op: [place] placement   eICU Interventions  Care plan reviewed. No changes made     Intervention Category Evaluation Type: New Patient Evaluation  Wilhelmina Mcardle 02/01/2017, 6:39 PM

## 2017-02-01 NOTE — Anesthesia Procedure Notes (Signed)
Procedure Name: Intubation Date/Time: 02/01/2017 3:38 PM Performed by: Lance Coon Pre-anesthesia Checklist: Patient identified, Emergency Drugs available, Suction available, Patient being monitored and Timeout performed Patient Re-evaluated:Patient Re-evaluated prior to inductionOxygen Delivery Method: Circle system utilized Preoxygenation: Pre-oxygenation with 100% oxygen Intubation Type: IV induction Ventilation: Mask ventilation without difficulty Laryngoscope Size: Miller and 2 Grade View: Grade I Tube type: Oral Tube size: 7.0 mm Number of attempts: 1 Airway Equipment and Method: Stylet Placement Confirmation: ETT inserted through vocal cords under direct vision,  positive ETCO2 and breath sounds checked- equal and bilateral Secured at: 21 cm Tube secured with: Tape Dental Injury: Teeth and Oropharynx as per pre-operative assessment

## 2017-02-01 NOTE — Brief Op Note (Signed)
02/01/2017  4:43 PM  PATIENT:  Maximino Sarin E Rodriguez-Fuentes  31 y.o. female  PRE-OPERATIVE DIAGNOSIS:  Brain mass  POST-OPERATIVE DIAGNOSIS:  same  PROCEDURE:  Procedure(s) with comments: [place] placement with Brainlab (N/A) - [place] placement with Brain lab [place] (N/A)  SURGEON:  Surgeon(s) and Role:    * Kevan Ny Ditty, MD - Primary  PHYSICIAN ASSISTANT: Ferne Reus, PA-C - Assistant  ANESTHESIA:   general  EBL:  Total I/O In: 1000 [I.V.:1000] Out: -   BLOOD ADMINISTERED:none  DRAINS: none   LOCAL MEDICATIONS USED:  LIDOCAINE   SPECIMEN:  No Specimen  DISPOSITION OF SPECIMEN:  N/A  COUNTS:  YES  TOURNIQUET:  * No tourniquets in log *  DICTATION: .Dragon Dictation  PLAN OF CARE: Admit to inpatient   PATIENT DISPOSITION:  PACU - hemodynamically stable   Delay start of Pharmacological VTE agent (>24hrs) due to surgical blood loss or risk of bleeding: yes

## 2017-02-01 NOTE — Transfer of Care (Signed)
Immediate Anesthesia Transfer of Care Note  Patient: [name]  Procedure(s) Performed: Procedure(s) with comments: [place] placement with Brainlab (N/A) - [place] placement with Brain lab [place] (N/A)  Patient Location: PACU  Anesthesia Type:General  Level of Consciousness: awake, alert , oriented and patient cooperative  Airway & Oxygen Therapy: Patient Spontanous Breathing  Post-op Assessment: Report given to RN and Post -op Vital signs reviewed and stable  Post vital signs: Reviewed and stable  Last Vitals:  Vitals:   02/01/17 1409  BP: 95/62  Pulse: 68  Resp: 18  Temp: 37 C    Last Pain:  Vitals:   02/01/17 1409  TempSrc: Oral         Complications: No apparent anesthesia complications

## 2017-02-01 NOTE — Progress Notes (Signed)
[employer] Interpreter [phone] for preoperative interview

## 2017-02-01 NOTE — Progress Notes (Signed)
Pt's BP trending down starting at 1900 - SBP got as low as 79 and MAP as low as 60. At that time, notified on-call neurosurgery PA and received orders to give a 57mL 0.9% saline bolus - then to encourage pt to intake PO as well.   Will continue to monitor.   Guadalupe Maple, RN

## 2017-02-01 NOTE — Anesthesia Preprocedure Evaluation (Signed)
Anesthesia Evaluation  Patient identified by MRN, date of birth, ID band Patient awake    Reviewed: Allergy & Precautions, H&P , NPO status , Patient's Chart, lab work & pertinent test results  Airway Mallampati: II   Neck ROM: full    Dental   Pulmonary    breath sounds clear to auscultation       Cardiovascular negative cardio ROS   Rhythm:regular Rate:Normal     Neuro/Psych  Headaches, Brain mass    GI/Hepatic   Endo/Other    Renal/GU      Musculoskeletal   Abdominal   Peds  Hematology   Anesthesia Other Findings   Reproductive/Obstetrics Breast CA                             Anesthesia Physical Anesthesia Plan  ASA: II  Anesthesia Plan: General   Post-op Pain Management:    Induction: Intravenous  Airway Management Planned: Oral ETT  Additional Equipment:   Intra-op Plan:   Post-operative Plan: Extubation in OR  Informed Consent: I have reviewed the patients History and Physical, chart, labs and discussed the procedure including the risks, benefits and alternatives for the proposed anesthesia with the patient or authorized representative who has indicated his/her understanding and acceptance.     Plan Discussed with: CRNA, Anesthesiologist and Surgeon  Anesthesia Plan Comments:         Anesthesia Quick Evaluation

## 2017-02-02 ENCOUNTER — Encounter (HOSPITAL_COMMUNITY): Payer: Self-pay | Admitting: Neurological Surgery

## 2017-02-02 LAB — CBC
HCT: 34.5 % — ABNORMAL LOW (ref 36.0–46.0)
Hemoglobin: 11.2 g/dL — ABNORMAL LOW (ref 12.0–15.0)
MCH: 21.3 pg — ABNORMAL LOW (ref 26.0–34.0)
MCHC: 32.5 g/dL (ref 30.0–36.0)
MCV: 65.6 fL — ABNORMAL LOW (ref 78.0–100.0)
Platelets: 223 10*3/uL (ref 150–400)
RBC: 5.26 MIL/uL — ABNORMAL HIGH (ref 3.87–5.11)
RDW: 20 % — ABNORMAL HIGH (ref 11.5–15.5)
WBC: 11.6 10*3/uL — ABNORMAL HIGH (ref 4.0–10.5)

## 2017-02-02 LAB — BASIC METABOLIC PANEL
Anion gap: 9 (ref 5–15)
BUN: 11 mg/dL (ref 6–20)
CO2: 23 mmol/L (ref 22–32)
Calcium: 8.6 mg/dL — ABNORMAL LOW (ref 8.9–10.3)
Chloride: 104 mmol/L (ref 101–111)
Creatinine, Ser: 0.39 mg/dL — ABNORMAL LOW (ref 0.44–1.00)
GFR calc Af Amer: >60 mL/min (ref >60)
GFR calc non Af Amer: >60 mL/min (ref >60)
Glucose, Bld: 105 mg/dL — ABNORMAL HIGH (ref 65–99)
Potassium: 4 mmol/L (ref 3.5–5.1)
Sodium: 136 mmol/L (ref 135–145)

## 2017-02-02 MED ORDER — PNEUMOCOCCAL VAC POLYVALENT 25 MCG/0.5ML IJ INJ: 0.5000 mL | INJECTION | INTRAMUSCULAR | Status: DC

## 2017-02-02 MED ORDER — OXYCODONE-ACETAMINOPHEN 5-325 MG PO TABS: 1.0000 | ORAL_TABLET | ORAL | 0 refills | Status: DC | PRN

## 2017-02-02 NOTE — Care Management Note (Signed)
Case Management Note  Patient Details  Name: ALEXSYS ESKIN MRN: [id number] Date of Birth: [birth year]  Subjective/Objective:   Pt admitted on 02/01/17 with leptomeningeal carcinomatosis for Ommaya reservoir for intrathecal chemotherapy.  PTA, pt independent, lives with spouse.                 Action/Plan: Pt for discharge home today.  No dc needs identified.    Expected Discharge Date:  02/02/17               Expected Discharge Plan:  Home/Self Care  In-House Referral:     Discharge planning Services  CM Consult  Post Acute Care Choice:    Choice offered to:     DME Arranged:    DME Agency:     HH Arranged:    HH Agency:     Status of Service:  Completed, signed off  If discussed at [employer] of Stay Meetings, dates discussed:    Additional Comments:  Ella Bodo, RN 02/02/2017, 1:30 PM

## 2017-02-02 NOTE — Progress Notes (Signed)
Discharge orders received, pt for discharge home today, IV D/C with dressing CDI to R temporal head.  D/C instructions and Rx given with verbalized understanding.  Family at bedside to assist pt with discharge. Staff brought pt downstairs via wheelchair.

## 2017-02-02 NOTE — Progress Notes (Signed)
Doing well No acute issues Neuro stable D/c

## 2017-02-02 NOTE — Discharge Summary (Signed)
Date of Admission: 02/01/2017  Date of Discharge: 02/02/17  PRE-OPERATIVE DIAGNOSIS:  Brain mass  POST-OPERATIVE DIAGNOSIS:  same  PROCEDURE:  Procedure(s) with comments: [place] placement with Brainlab (N/A) - [place] placement with Brain lab [place] (N/A)  Attending: Kevan Ny Deny Chevez, MD  Hospital Course:  The patient was admitted for the above listed operation. Hypotensive, but is without symptoms. Given bolus overnight. She is without headache, dizziness, chest pain, shortness of breath. Overall, feels well and ready for discharge. They were discharged in stable condition.  Follow up: 3 weeks  Discharge Meds: Resume prior meds

## 2017-02-03 NOTE — Anesthesia Postprocedure Evaluation (Signed)
Anesthesia Post Note  Patient: [name]  Procedure(s) Performed: Procedure(s) (LRB): Omaya Reservoir placement with Brainlab (N/A) APPLICATION OF CRANIAL NAVIGATION (N/A)  Patient location during evaluation: PACU Anesthesia Type: General Level of consciousness: awake and alert and patient cooperative Pain management: pain level controlled Vital Signs Assessment: post-procedure vital signs reviewed and stable Respiratory status: spontaneous breathing and respiratory function stable Cardiovascular status: stable Anesthetic complications: no       Last Vitals:  Vitals:   02/02/17 1200 02/02/17 1300  BP: 90/62 91/69  Pulse: 64 64  Resp: 13 15  Temp: 36.7 C     Last Pain:  Vitals:   02/02/17 1200  TempSrc: Oral  PainSc: 0-No pain                 [name] S

## 2017-02-04 ENCOUNTER — Other Ambulatory Visit: Payer: Self-pay

## 2017-02-07 ENCOUNTER — Ambulatory Visit: Payer: Self-pay

## 2017-02-07 ENCOUNTER — Ambulatory Visit (HOSPITAL_BASED_OUTPATIENT_CLINIC_OR_DEPARTMENT_OTHER): Payer: Self-pay | Admitting: Hematology and Oncology

## 2017-02-07 ENCOUNTER — Other Ambulatory Visit (HOSPITAL_BASED_OUTPATIENT_CLINIC_OR_DEPARTMENT_OTHER): Payer: Self-pay

## 2017-02-07 ENCOUNTER — Encounter: Payer: Self-pay | Admitting: Hematology and Oncology

## 2017-02-07 DIAGNOSIS — Z170 Estrogen receptor positive status [ER+]: Principal | ICD-10-CM

## 2017-02-07 DIAGNOSIS — C7931 Secondary malignant neoplasm of brain: Secondary | ICD-10-CM

## 2017-02-07 DIAGNOSIS — C50512 Malignant neoplasm of lower-outer quadrant of left female breast: Secondary | ICD-10-CM

## 2017-02-07 DIAGNOSIS — C801 Malignant (primary) neoplasm, unspecified: Secondary | ICD-10-CM

## 2017-02-07 LAB — COMPREHENSIVE METABOLIC PANEL
ALBUMIN: 3.2 g/dL — AB (ref 3.5–5.0)
ALK PHOS: 74 U/L (ref 40–150)
ALT: 18 U/L (ref 0–55)
AST: 13 U/L (ref 5–34)
Anion Gap: 8 mEq/L (ref 3–11)
BILIRUBIN TOTAL: 0.47 mg/dL (ref 0.20–1.20)
BUN: 10.9 mg/dL (ref 7.0–26.0)
CO2: 26 mEq/L (ref 22–29)
CREATININE: 0.6 mg/dL (ref 0.6–1.1)
Calcium: 9.1 mg/dL (ref 8.4–10.4)
Chloride: 106 mEq/L (ref 98–109)
GLUCOSE: 84 mg/dL (ref 70–140)
Potassium: 3.9 mEq/L (ref 3.5–5.1)
SODIUM: 139 meq/L (ref 136–145)
TOTAL PROTEIN: 5.8 g/dL — AB (ref 6.4–8.3)

## 2017-02-07 LAB — CBC WITH DIFFERENTIAL/PLATELET
BASO%: 0.3 % (ref 0.0–2.0)
BASOS ABS: 0 10*3/uL (ref 0.0–0.1)
EOS ABS: 0.1 10*3/uL (ref 0.0–0.5)
EOS%: 0.7 % (ref 0.0–7.0)
HCT: 35.3 % (ref 34.8–46.6)
HEMOGLOBIN: 11.5 g/dL — AB (ref 11.6–15.9)
LYMPH%: 15.2 % (ref 14.0–49.7)
MCH: 21.4 pg — ABNORMAL LOW (ref 25.1–34.0)
MCHC: 32.6 g/dL (ref 31.5–36.0)
MCV: 65.7 fL — AB (ref 79.5–101.0)
MONO#: 0.4 10*3/uL (ref 0.1–0.9)
MONO%: 4.8 % (ref 0.0–14.0)
NEUT#: 5.7 10*3/uL (ref 1.5–6.5)
NEUT%: 79 % — AB (ref 38.4–76.8)
Platelets: 171 10*3/uL (ref 145–400)
RBC: 5.37 10*6/uL (ref 3.70–5.45)
RDW: 20.2 % — ABNORMAL HIGH (ref 11.2–14.5)
WBC: 7.2 10*3/uL (ref 3.9–10.3)
lymph#: 1.1 10*3/uL (ref 0.9–3.3)
nRBC: 0 % (ref 0–0)

## 2017-02-07 LAB — TECHNOLOGIST REVIEW

## 2017-02-07 NOTE — Progress Notes (Signed)
Patient Care Team: Nicholas Lose, MD as PCP - General (Hematology and Oncology)  DIAGNOSIS:  Encounter Diagnosis  Name Primary?  . Malignant neoplasm of lower-outer quadrant of left breast of female, estrogen receptor positive ([place])     SUMMARY OF ONCOLOGIC HISTORY:   Breast cancer of lower-outer quadrant of left female breast ([place])   06/06/2014 Imaging    Palpable Left breast mass at 12:00 position 1.9 cm, initial biopsy 06/12/2014 revealed fibrocystic changes.      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 The genes tested were ATM, BARD1, BRCA1, BRCA2, BRIP1, CDH1, CHEK2, MRE11A, MUTYH, NBN, NF1, PALB2, PTEN, RAD50, RAD51C, RAD51D, and TP53.      10/04/2014 - 01/20/2015 Chemotherapy    Patient was started on adjuvant chemotherapy with Taxotere, Carboplatin, Herceptin, Perjeta followed by Herceptin maintenance for 1 year.        02/11/2015 - 03/25/2015 Radiation Therapy    Adjuvant radiation therapy      05/01/2015 -  Anti-estrogen oral therapy    tamoxifen 20 mg daily      01/03/2017 Imaging    Brain MRI: Intra-axial mass right inf cerebellum 33 mm, mass left sup cerebellum 25 mm, leptomeningeal carcinomatosis of cerebellum, left trigeminal nerve involvement, mass effect related to the right inferior cerebellar metastases slight right-to-left shift, 6 mm metastases and foramina of [place], punctate focus right frontal subcortical white matter      01/05/2017 - 01/19/2017 Radiation Therapy    Whole brain radiation therapy       CHIEF COMPLIANT: Follow-up after recent Ommaya placement  INTERVAL HISTORY: [name] is a 31 year old with above-mentioned history of breast cancer who was recently diagnosed with brain metastases and leptomeningeal carcinomatosis, got treatment with whole brain radiation therapy. She had Ommaya reservoir placement recently and is here today to discuss the treatment steps. She is still healing from the Ommaya reservoir surgery. She has met with palliative care and discussed goals of care with them. She is [facility] for [facility] opinion this Wednesday. After that we plan to initiate her treatment. I'm awaiting permission from neurosurgery to let as use the Ommaya reservoir.  REVIEW OF SYSTEMS:   Constitutional: Denies fevers, chills or abnormal weight loss Eyes: Denies blurriness of vision Ears, nose, mouth, throat, and face: Denies mucositis or sore throat Respiratory: Denies cough, dyspnea or wheezes Cardiovascular: Denies palpitation, chest discomfort Gastrointestinal:  Denies nausea, heartburn or change in bowel habits Skin: Denies abnormal skin rashes Lymphatics: Denies new lymphadenopathy or easy bruising Neurological:Denies numbness, tingling or new weaknesses Behavioral/Psych: Mood is stable, no new changes  Extremities: No lower extremity edema All other systems were reviewed with the patient and are negative.  I have reviewed the past medical history, past surgical history, social history and family history with the patient and they are unchanged from previous note.  ALLERGIES:  is allergic to zofran [ondansetron hcl].  MEDICATIONS:  Current Outpatient Prescriptions  Medication Sig Dispense Refill  . acetaminophen (TYLENOL)  500 MG tablet Take 1,000 mg by mouth every 6 (six) hours as needed for headache.    . dexamethasone (DECADRON) 2 MG tablet Take 1 tablet (2 mg total) by  mouth 2 (two) times daily.    . diphenhydrAMINE (BENADRYL) 25 MG tablet Take 25-50 mg by mouth every 6 (six) hours as needed for itching or allergies.     Marland Kitchen LORazepam (ATIVAN) 0.5 MG tablet Take 1 tablet (0.5 mg total) by mouth every 8 (eight) hours. (Patient taking differently: Take 0.5 mg by mouth every 8 (eight) hours as needed for anxiety or sleep. ) 30 tablet 0  . omeprazole (PRILOSEC) 20 MG capsule Take 1 capsule (20 mg total) by mouth 2 (two) times daily. 60 capsule 0  . oxyCODONE-acetaminophen (PERCOCET/ROXICET) 5-325 MG tablet Take 1 tablet by mouth every 4 (four) hours as needed for severe pain. 30 tablet 0  . tamoxifen (NOLVADEX) 20 MG tablet Take 1 tablet (20 mg total) by mouth daily. 90 tablet 3   No current facility-administered medications for this visit.    Facility-Administered Medications Ordered in Other Visits  Medication Dose Route Frequency Provider Last Rate Last Dose  . sodium chloride 0.9 % injection 10 mL  10 mL Intravenous PRN Nicholas Lose, MD   10 mL at 12/09/14 1632    PHYSICAL EXAMINATION: ECOG PERFORMANCE STATUS: 1 - Symptomatic but completely ambulatory  Vitals:   02/07/17 0944  BP: (!) 95/59  Pulse: (!) 105  Resp: 16  Temp: 98.2 F (36.8 C)   Filed Weights   02/07/17 0944  Weight: 116 lb (52.6 kg)    GENERAL:alert, no distress and comfortable SKIN: skin color, texture, turgor are normal, no rashes or significant lesions EYES: normal, Conjunctiva are pink and non-injected, sclera clear OROPHARYNX:no exudate, no erythema and lips, buccal mucosa, and tongue normal  NECK: supple, thyroid normal size, non-tender, without nodularity LYMPH:  no palpable lymphadenopathy in the cervical, axillary or inguinal LUNGS: clear to auscultation and percussion with normal breathing effort HEART: regular rate & rhythm and no murmurs and no lower extremity edema ABDOMEN:abdomen soft, non-tender and normal bowel sounds MUSCULOSKELETAL:no cyanosis of digits and no  clubbing  NEURO: alert & oriented x 3 with fluent speech, no focal motor/sensory deficits EXTREMITIES: No lower extremity edema  LABORATORY DATA:  I have reviewed the data as listed   Chemistry      Component Value Date/Time   NA 139 02/07/2017 0930   K 3.9 02/07/2017 0930   CL 104 02/02/2017 0316   CO2 26 02/07/2017 0930   BUN 10.9 02/07/2017 0930   CREATININE 0.6 02/07/2017 0930      Component Value Date/Time   CALCIUM 9.1 02/07/2017 0930   ALKPHOS 74 02/07/2017 0930   AST 13 02/07/2017 0930   ALT 18 02/07/2017 0930   BILITOT 0.47 02/07/2017 0930       Lab Results  Component Value Date   WBC 7.2 02/07/2017   HGB 11.5 (L) 02/07/2017   HCT 35.3 02/07/2017   MCV 65.7 (L) 02/07/2017   PLT 171 02/07/2017   NEUTROABS 5.7 02/07/2017    ASSESSMENT & PLAN:  Breast cancer of lower-outer quadrant of left female breast (HCC) Left breast invasive ductal carcinoma multifocal disease with multiple nodules ranging from 0.3 cm up to 3.2 cm and 17/20 lymph nodes positive. Positive for lymphovascular invasion, extracapsular tumor extension grade 3 ER 53%, PR 30%, HER-2 positive ratio 5.95, Ki-67 33%, T2, N3, M0 stage IIIc with high-grade DCIS status post  left mastectomy 09/06/2014; completed adjuvant chemotherapy 01/20/2015 and completed Herceptin maintenance October 2016 and radiation started 02/11/2015 completed 03/25/2015 by Dr. Isidore Moos. She is on tamoxifen 20 mg daily.  Brain MRI 01/03/2017:Intra-axial mass right inf cerebellum 33 mm, mass left sup cerebellum 25 mm, leptomeningeal carcinomatosis of cerebellum, left trigeminal nerve involvement, mass effect related to the right inferior cerebellar metastases slight right-to-left shift, 6 mm metastases and foramina of [place], punctate focus right frontal subcortical white matter  CT CAP 01/03/2017: No evidence of distant metastatic disease beyond the CNS Prognosis: Patient understands that metastatic breast cancer cannot be cured. The  goals of treatment are palliation. 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 Zoladex with letrozole andlapatinib palliative treatment. 2. dexamethasone is off 3.Followed by intrathecal depocytarabine once the Ommaya reservoir has healed. Plan to treat her every 2 weeks  We are extremely concerned about the spinal leptomeningeal carcinomatosis Return to clinic hopefully this Thursday to start intrathecal depot cytarabine.  I spent 25 minutes talking to the patient of which more than half was spent in counseling and coordination of care.  No orders of the defined types were placed in this encounter.  The patient has a good understanding of the overall plan. she agrees with it. she will call with any problems that may develop before the next visit here.   Rulon Eisenmenger, MD 02/07/17

## 2017-02-07 NOTE — Assessment & Plan Note (Signed)
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 Positive for lymphovascular invasion, extracapsular tumor extension grade 3 ER 53%, PR 30%, HER-2 positive ratio 5.95, Ki-67 33%, T2, N3, M0 stage IIIc with high-grade DCIS status post left mastectomy 09/06/2014; completed adjuvant chemotherapy 01/20/2015 and completed Herceptin maintenance October 2016 and radiation started 02/11/2015 completed 03/25/2015 by Dr. Isidore Moos. She is on tamoxifen 20 mg daily.  Brain MRI 01/03/2017:Intra-axial mass right inf cerebellum 33 mm, mass left sup cerebellum 25 mm, leptomeningeal carcinomatosis of cerebellum, left trigeminal nerve involvement, mass effect related to the right inferior cerebellar metastases slight right-to-left shift, 6 mm metastases and foramina of [place], punctate focus right frontal subcortical white matter  CT CAP 01/03/2017: No evidence of distant metastatic disease beyond the CNS Prognosis: Patient understands that metastatic breast cancer cannot be cured. The goals of treatment are palliation. 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 Zoladex with letrozole andlapatinib palliative treatment. 2. dexamethasone being tapered 3.Followed by intrathecal depocytarabine once the Ommaya reservoir has healed    We are extremely concerned about the spinal leptomeningeal carcinomatosis

## 2017-02-08 ENCOUNTER — Ambulatory Visit: Payer: Self-pay | Admitting: Hematology and Oncology

## 2017-02-08 ENCOUNTER — Other Ambulatory Visit: Payer: Self-pay

## 2017-02-10 ENCOUNTER — Other Ambulatory Visit (HOSPITAL_COMMUNITY)
Admission: RE | Admit: 2017-02-10 | Discharge: 2017-02-10 | Disposition: A | Discharge status: Home / Self Care | Payer: Self-pay | Source: Ambulatory Visit | Attending: Hematology and Oncology | Admitting: Hematology and Oncology

## 2017-02-10 ENCOUNTER — Ambulatory Visit: Payer: Self-pay

## 2017-02-10 ENCOUNTER — Encounter: Payer: Self-pay | Admitting: Hematology and Oncology

## 2017-02-10 ENCOUNTER — Other Ambulatory Visit: Payer: Self-pay | Admitting: Hematology and Oncology

## 2017-02-10 ENCOUNTER — Ambulatory Visit (HOSPITAL_BASED_OUTPATIENT_CLINIC_OR_DEPARTMENT_OTHER): Payer: Self-pay | Admitting: Hematology and Oncology

## 2017-02-10 VITALS — BP 96/64 | HR 87 | Temp 98.6°F | Resp 18 | Ht 60.0 in | Wt 118.6 lb

## 2017-02-10 DIAGNOSIS — C7949 Secondary malignant neoplasm of other parts of nervous system: Secondary | ICD-10-CM

## 2017-02-10 DIAGNOSIS — Z5111 Encounter for antineoplastic chemotherapy: Secondary | ICD-10-CM

## 2017-02-10 DIAGNOSIS — C50512 Malignant neoplasm of lower-outer quadrant of left female breast: Secondary | ICD-10-CM

## 2017-02-10 DIAGNOSIS — Z170 Estrogen receptor positive status [ER+]: Principal | ICD-10-CM

## 2017-02-10 DIAGNOSIS — C801 Malignant (primary) neoplasm, unspecified: Secondary | ICD-10-CM | POA: Insufficient documentation

## 2017-02-10 LAB — CSF CELL COUNT WITH DIFFERENTIAL
RBC Count, CSF: 144 /mm3 — ABNORMAL HIGH
WBC, CSF: 4 /mm3 (ref 0–5)

## 2017-02-10 MED ORDER — PROCHLORPERAZINE EDISYLATE 5 MG/ML IJ SOLN
10.0000 mg | Freq: Once | INTRAMUSCULAR | Status: AC
  Administered 2017-02-10: 10 mg via INTRAVENOUS

## 2017-02-10 MED ORDER — ACETAMINOPHEN 500 MG PO TABS
ORAL_TABLET | ORAL | Status: AC
  Filled 2017-02-10: qty 2

## 2017-02-10 MED ORDER — DEXAMETHASONE 4 MG PO TABS
ORAL_TABLET | ORAL | Status: AC
  Filled 2017-02-10: qty 1

## 2017-02-10 MED ORDER — ACETAMINOPHEN 325 MG PO TABS
650.0000 mg | ORAL_TABLET | Freq: Once | ORAL | Status: AC
  Administered 2017-02-10: 650 mg via ORAL

## 2017-02-10 MED ORDER — PROCHLORPERAZINE MALEATE 10 MG PO TABS
ORAL_TABLET | ORAL | Status: AC
  Filled 2017-02-10: qty 1

## 2017-02-10 MED ORDER — CYTARABINE LIPOSOME CHEMO INTRATHECAL 50 MG/5ML
50.0000 mg | Freq: Once | INTRATHECAL | Status: AC
  Administered 2017-02-10: 50 mg via INTRATHECAL
  Filled 2017-02-10: qty 5

## 2017-02-10 MED ORDER — DEXAMETHASONE 4 MG PO TABS
2.0000 mg | ORAL_TABLET | Freq: Once | ORAL | Status: AC
  Administered 2017-02-10: 2 mg via ORAL

## 2017-02-10 NOTE — Op Note (Signed)
02/01/2017  8:36 AM  PATIENT:  [name]  31 y.o. female  PRE-OPERATIVE DIAGNOSIS:  Leptomeningeal carcinomatosis due to breast cancer metastasis  POST-OPERATIVE DIAGNOSIS:  Same  PROCEDURE:  Placement of right frontal Ommaya reservoir; use of stereotactic navigation  SURGEON:  Aldean Ast, MD  ASSISTANTS: Ferne Reus, PA-C  ANESTHESIA:   General  DRAINS: None   SPECIMEN:  None  INDICATION FOR PROCEDURE: 31 year old female with leptomeningeal carcinomatosis.  Oncology requested placement of an Ommaya reservoir for palliative chemotherapy. Patient understood the risks, benefits, and alternatives and potential outcomes and wished to proceed.  PROCEDURE DETAILS: After smooth induction of general endotracheal anesthesia the patient was positioned supine on the operating table.  Her head was fixated in [place] pins and registered with the Brainlab stereotactic system.  The trajectory was planned and the skin of the planned incision was injected with lidocaine and marcaine with epinephrine.  The patient was prepped and draped in the usual sterile fashion.  A curvilinear incision was made on the right frontal area.  The skin was reflected posteriorly.  The stereotactic navigation system was used to identify the planned location for the burr hole.  This was made using a high speed drill.  The dura was coagulated.  A pocket for the Ommaya reservoir was created posteriorly, behind the incision.  The dura was sharply incised.  The pia was cauterized.  The Ommaya catheter was passed along the planned trajectory using a navigated stylet.  There was return of spinal fluid.  The catheter was secured to the Ommaya reservoir with a silk tie.  It was then tucked into the created pocket.  I irrigated with bacitracin saline.  The wound was closed in routine anatomic layers with interrupted vicryl sutures.  The skin was closed with staples.  A sterile dressing was  applied.  PATIENT DISPOSITION:  ICU - extubated and stable.   Delay start of Pharmacological VTE agent (>24hrs) due to surgical blood loss or risk of bleeding:  yes

## 2017-02-10 NOTE — Progress Notes (Signed)
DISCONTINUE OFF PATHWAY REGIMEN - Breast     Daily:     Tamoxifen        Dose Mod: None  **Always confirm dose/schedule in your pharmacy ordering system**    REASON: Disease Progression PRIOR TREATMENT: [id number]: Ovarian Suppression Followed by Tamoxifen 20 mg Daily TREATMENT RESPONSE: Progressive Disease (PD)  START OFF PATHWAY REGIMEN - Breast   [id number]:Trastuzumab (Maintenance - w/Loading Dose):   A cycle is every 21 days:     Trastuzumab      Trastuzumab   **Always confirm dose/schedule in your pharmacy ordering system**    Patient Characteristics: Metastatic Chemotherapy, HER2/neu Positive, ER +, Second Line AJCC Stage Grouping: IV Current Disease Status: Distant Metastases AJCC M Stage: X ER Status: Positive (+) AJCC N Stage: X AJCC T Stage: X HER2/neu: Positive (+) PR Status: Positive (+) Line of therapy: Second Line Would you be surprised if this patient died  in the next year? I would be surprised if this patient died in the next year  Intent of Therapy: Non-Curative / Palliative Intent, Discussed with Patient

## 2017-02-10 NOTE — Progress Notes (Signed)
Patient Care Team: Nicholas Lose, MD as PCP - General (Hematology and Oncology)  DIAGNOSIS:  Encounter Diagnoses  Name Primary?  . Malignant neoplasm of lower-outer quadrant of left breast of female, estrogen receptor positive ([place]) Yes  . Cancer with leptomeningeal spread ([place])     SUMMARY OF ONCOLOGIC HISTORY:   Breast cancer of lower-outer quadrant of left female breast ([place])   06/06/2014 Imaging    Palpable Left breast mass at 12:00 position 1.9 cm, initial biopsy 06/12/2014 revealed fibrocystic changes.      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 The genes tested were ATM, BARD1, BRCA1, BRCA2, BRIP1, CDH1, CHEK2, MRE11A, MUTYH, NBN, NF1, PALB2, PTEN, RAD50, RAD51C, RAD51D, and TP53.      10/04/2014 - 01/20/2015 Chemotherapy    Patient was started on adjuvant chemotherapy with Taxotere, Carboplatin, Herceptin, Perjeta followed by Herceptin maintenance for 1 year.        02/11/2015 - 03/25/2015 Radiation Therapy    Adjuvant radiation therapy      05/01/2015 -  Anti-estrogen oral therapy    tamoxifen 20 mg daily      01/03/2017 Imaging    Brain MRI: Intra-axial mass right inf cerebellum 33 mm, mass left sup cerebellum 25 mm, leptomeningeal carcinomatosis of cerebellum, left trigeminal nerve involvement, mass effect related to the right inferior cerebellar metastases slight right-to-left shift, 6 mm metastases and foramina of [place], punctate focus right  frontal subcortical white matter      01/05/2017 - 01/19/2017 Radiation Therapy    Whole brain radiation therapy      CHIEF COMPLIANT: Metastatic breast cancer follow-up after second opinion at [place]: [name] is a 31 year old with above-mentioned history metastatic breast cancer who 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 She had seen Dr. Wetzel Bjornstad at [facility] and is here today to discuss the treatment plan. Dr. Wetzel Bjornstad suggested that the patient undergo intrathecal chemotherapy if she has positive cytology. She was also recommended to undergo Herceptin and Perjeta. This is different from my earlier recommendation of lapatinib. This is because it is no safety data for lapatinib with intrathecal chemotherapy. She saw Dr. Cyndy Freeze with neurosurgery yesterday.  REVIEW OF SYSTEMS:   Constitutional: Denies fevers, chills or abnormal weight loss Eyes: Denies blurriness of vision Ears, nose, mouth, throat, and face: Denies mucositis or sore throat Respiratory: Denies cough, dyspnea or wheezes Cardiovascular: Denies palpitation, chest discomfort Gastrointestinal:  Denies nausea, heartburn or change in bowel habits Skin: Denies abnormal skin rashes Lymphatics: Denies new lymphadenopathy or easy bruising Neurological: Staples on the scalp Behavioral/Psych: Mood is stable, no new changes  Extremities: No lower extremity edema  All other systems were reviewed with the patient and are negative.  I have reviewed the past medical history, past surgical history, social history and family history with the patient and they are unchanged from previous note.  ALLERGIES:  is allergic to zofran [ondansetron hcl].  MEDICATIONS:  Current Outpatient Prescriptions  Medication Sig Dispense Refill  .  acetaminophen (TYLENOL) 500 MG tablet Take 1,000 mg by mouth every 6 (six) hours as needed for headache.    . dexamethasone (DECADRON) 2 MG tablet Take 1 tablet (2 mg  total) by mouth 2 (two) times daily.    . diphenhydrAMINE (BENADRYL) 25 MG tablet Take 25-50 mg by mouth every 6 (six) hours as needed for itching or allergies.     Marland Kitchen LORazepam (ATIVAN) 0.5 MG tablet Take 1 tablet (0.5 mg total) by mouth every 8 (eight) hours. (Patient taking differently: Take 0.5 mg by mouth every 8 (eight) hours as needed for anxiety or sleep. ) 30 tablet 0  . omeprazole (PRILOSEC) 20 MG capsule Take 1 capsule (20 mg total) by mouth 2 (two) times daily. 60 capsule 0  . oxyCODONE-acetaminophen (PERCOCET/ROXICET) 5-325 MG tablet Take 1 tablet by mouth every 4 (four) hours as needed for severe pain. 30 tablet 0  . tamoxifen (NOLVADEX) 20 MG tablet Take 1 tablet (20 mg total) by mouth daily. 90 tablet 3   Current Facility-Administered Medications  Medication Dose Route Frequency Provider Last Rate Last Dose  . cytarabine (liposome) (DEPOCYT) INTRATHECAL chemo injection 50 mg  50 mg Intrathecal Once Nicholas Lose, MD       Facility-Administered Medications Ordered in Other Visits  Medication Dose Route Frequency Provider Last Rate Last Dose  . sodium chloride 0.9 % injection 10 mL  10 mL Intravenous PRN Nicholas Lose, MD   10 mL at 12/09/14 1632    PHYSICAL EXAMINATION: ECOG PERFORMANCE STATUS: 1 - Symptomatic but completely ambulatory  Vitals:   02/10/17 1347  BP: (!) 98/57  Pulse: 88  Resp: 18  Temp: 98.3 F (36.8 C)   Filed Weights   02/10/17 1347  Weight: 118 lb 9.6 oz (53.8 kg)    GENERAL:alert, no distress and comfortable SKIN: skin color, texture, turgor are normal, no rashes or significant lesions EYES: normal, Conjunctiva are pink and non-injected, sclera clear OROPHARYNX:no exudate, no erythema and lips, buccal mucosa, and tongue normal  NECK: supple, thyroid normal size, non-tender, without nodularity LYMPH:  no palpable lymphadenopathy in the cervical, axillary or inguinal LUNGS: clear to auscultation and percussion with normal breathing effort HEART:  regular rate & rhythm and no murmurs and no lower extremity edema ABDOMEN:abdomen soft, non-tender and normal bowel sounds MUSCULOSKELETAL:no cyanosis of digits and no clubbing  NEURO: alert & oriented x 3 with fluent speech, no focal motor/sensory deficits EXTREMITIES: No lower extremity edema  LABORATORY DATA:  I have reviewed the data as listed   Chemistry      Component Value Date/Time   NA 139 02/07/2017 0930   K 3.9 02/07/2017 0930   CL 104 02/02/2017 0316   CO2 26 02/07/2017 0930   BUN 10.9 02/07/2017 0930   CREATININE 0.6 02/07/2017 0930      Component Value Date/Time   CALCIUM 9.1 02/07/2017 0930   ALKPHOS 74 02/07/2017 0930   AST 13 02/07/2017 0930   ALT 18 02/07/2017 0930   BILITOT 0.47 02/07/2017 0930       Lab Results  Component Value Date   WBC 7.2 02/07/2017   HGB 11.5 (L) 02/07/2017   HCT 35.3 02/07/2017   MCV 65.7 (L) 02/07/2017   PLT 171 02/07/2017   NEUTROABS 5.7 02/07/2017    ASSESSMENT & PLAN:  Breast cancer of lower-outer quadrant of left female breast ([place]) Breast cancer of lower-outer quadrant of left female breast ([place]) Left breast invasive ductal carcinoma multifocal disease with multiple nodules ranging from  0.3 cm up to 3.2 cm and 17/20 lymph nodes positive. Positive for lymphovascular invasion, extracapsular tumor extension grade 3 ER 53%, PR 30%, HER-2 positive ratio 5.95, Ki-67 33%, T2, N3, M0 stage IIIc with high-grade DCIS status post left mastectomy 09/06/2014; completed adjuvant chemotherapy 01/20/2015 and completed Herceptin maintenance October 2016 and radiation started 02/11/2015 completed 03/25/2015 by Dr. Isidore Moos. She was on tamoxifen 20 mg daily.  Brain MRI 01/03/2017:Intra-axial mass right inf cerebellum 33 mm, mass left sup cerebellum 25 mm, leptomeningeal carcinomatosis of cerebellum, left trigeminal nerve involvement, mass effect related to the right inferior cerebellar metastases slight right-to-left shift, 6 mm metastases and  foramina of [place], punctate focus right frontal subcortical white matter  CT CAP 01/03/2017: No evidence of distant metastatic disease beyond the CNS Prognosis: Patient understands that metastatic breast cancer cannot be cured. The goals of treatment are palliation. 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 and met with Dr. Harriett Rush  Recommendations: 1. Herceptin and Perjeta along with tamoxifen (lapatinib was not suggested because there is no safety data for intrathecal chemotherapy and lapatinib given simultaneously) 2. Ommaya tap and intrathecal chemotherapy if the tap is positive for malignancy.  Other options including Tecatinib (available on a clinical trial at [facility]) as well as Neratinib with Capecitabine were discussed Other options discussed included Kadcyla  ------------------------------------------------------------------------------------------------------------------------------------------------ Procedure note: Ommaya reservoir tap and intrathecal depot cytarabine Indication metastatic breast cancer with carcinomatous meningitis Consent has been obtained from the patient after explaining risks and benefits of the procedure. Patient was explained the risks and benefits of intrathecal chemotherapy including the risk of Arachnoiditis and nausea and vomiting.  Procedure: Scalp was cleaned with ChloraPrep. Using a sterile technique, the Ommaya reservoir was accessed using it 24-gauge butterfly needle. 7 mL of CSF was drawn and was sent for evaluation. Subsequently 50 mg of depot cytarabine was injected slowly over 4 minutes followed by 1 mL of preservative-free saline. The needle was removed. Small bandage has been placed Patient tolerated the procedure fairly well. She was observed for 30 minutes  postprocedure and was discharged home.  Complications: None Blood loss: None  I spent 25 minutes talking to the patient of which more than half was spent in counseling and coordination of care.  Orders Placed This Encounter  Procedures  . SCHEDULING COMMUNICATION    Infusion appointment 1 hour   The patient has a good understanding of the overall plan. she agrees with it. she will call with any problems that may develop before the next visit here.   Rulon Eisenmenger, MD 02/10/17

## 2017-02-10 NOTE — Assessment & Plan Note (Signed)
Breast cancer of lower-outer quadrant of left female breast ([place]) Left breast invasive ductal carcinoma multifocal disease with multiple nodules ranging from 0.3 cm up to 3.2 cm and 17/20 lymph nodes positive. Positive for lymphovascular invasion, extracapsular tumor extension grade 3 ER 53%, PR 30%, HER-2 positive ratio 5.95, Ki-67 33%, T2, N3, M0 stage IIIc with high-grade DCIS status post left mastectomy 09/06/2014; completed adjuvant chemotherapy 01/20/2015 and completed Herceptin maintenance October 2016 and radiation started 02/11/2015 completed 03/25/2015 by Dr. Isidore Moos. She was on tamoxifen 20 mg daily.  Brain MRI 01/03/2017:Intra-axial mass right inf cerebellum 33 mm, mass left sup cerebellum 25 mm, leptomeningeal carcinomatosis of cerebellum, left trigeminal nerve involvement, mass effect related to the right inferior cerebellar metastases slight right-to-left shift, 6 mm metastases and foramina of [place], punctate focus right frontal subcortical white matter  CT CAP 01/03/2017: No evidence of distant metastatic disease beyond the CNS Prognosis: Patient understands that metastatic breast cancer cannot be cured. The goals of treatment are palliation. 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 Harriett Rush  Recommendations: 1. Herceptin and Perjeta along with tamoxifen (lapatinib was not suggested because there is no safety data for intrathecal chemotherapy and lapatinib given simultaneously) 2. Ommaya tap and intrathecal chemotherapy if the tap is positive for malignancy.  Other options including Tecatinib (available on a clinical trial at [facility]) as well as Neratinib with Capecitabine were discussed Other options discussed included Kadcyla

## 2017-02-10 NOTE — Progress Notes (Signed)
At 318pm pt stated onset of headache at a 3 out of 10.  Headache located in bilateral temporal and frontal area.  Denies any visual changes or nausea.  1000 mg tylenol given per MD.  Pt able to swallow with difficulty.  Pt left in room with friend and interpreter with door open.

## 2017-02-11 ENCOUNTER — Telehealth: Payer: Self-pay | Admitting: *Deleted

## 2017-02-11 NOTE — Telephone Encounter (Signed)
This RN spoke with pt via [employer].  She states she felt a small onset of headache this AM - which then subsided on it's own.  She has not needed to take any tylenol and is reluctant to use decadron 2 mg bid over the weekend.  She states the decadron " makes me very weak and now that I am off I am able to walk around and do things "  Pt has no nausea and is able to eat and drink well.  Per discussion this RN informed pt if headache occurs and continues she should take the decadron and contact this office over the weekend.  Richard verbalized understanding and appreciation of call.  Verified with pt number for contact.

## 2017-02-12 ENCOUNTER — Inpatient Hospital Stay (HOSPITAL_COMMUNITY)
Admission: EM | Admit: 2017-02-12 | Discharge: 2017-02-14 | DRG: 097 | Disposition: A | Discharge status: Home / Self Care | Payer: Medicaid Other | Attending: Internal Medicine | Admitting: Internal Medicine

## 2017-02-12 ENCOUNTER — Other Ambulatory Visit: Payer: Self-pay

## 2017-02-12 ENCOUNTER — Encounter (HOSPITAL_COMMUNITY): Payer: Self-pay | Admitting: Vascular Surgery

## 2017-02-12 ENCOUNTER — Emergency Department (HOSPITAL_COMMUNITY): Payer: Medicaid Other

## 2017-02-12 DIAGNOSIS — Z800 Family history of malignant neoplasm of digestive organs: Secondary | ICD-10-CM

## 2017-02-12 DIAGNOSIS — G009 Bacterial meningitis, unspecified: Secondary | ICD-10-CM

## 2017-02-12 DIAGNOSIS — A419 Sepsis, unspecified organism: Secondary | ICD-10-CM | POA: Diagnosis present

## 2017-02-12 DIAGNOSIS — Z888 Allergy status to other drugs, medicaments and biological substances status: Secondary | ICD-10-CM

## 2017-02-12 DIAGNOSIS — C50512 Malignant neoplasm of lower-outer quadrant of left female breast: Secondary | ICD-10-CM | POA: Diagnosis present

## 2017-02-12 DIAGNOSIS — T451X5A Adverse effect of antineoplastic and immunosuppressive drugs, initial encounter: Secondary | ICD-10-CM | POA: Diagnosis present

## 2017-02-12 DIAGNOSIS — G038 Meningitis due to other specified causes: Principal | ICD-10-CM | POA: Diagnosis present

## 2017-02-12 DIAGNOSIS — Z923 Personal history of irradiation: Secondary | ICD-10-CM

## 2017-02-12 DIAGNOSIS — I9589 Other hypotension: Secondary | ICD-10-CM | POA: Diagnosis present

## 2017-02-12 DIAGNOSIS — Z79810 Long term (current) use of selective estrogen receptor modulators (SERMs): Secondary | ICD-10-CM

## 2017-02-12 DIAGNOSIS — E872 Acidosis, unspecified: Secondary | ICD-10-CM

## 2017-02-12 DIAGNOSIS — Z9689 Presence of other specified functional implants: Secondary | ICD-10-CM

## 2017-02-12 DIAGNOSIS — D649 Anemia, unspecified: Secondary | ICD-10-CM | POA: Diagnosis present

## 2017-02-12 DIAGNOSIS — G936 Cerebral edema: Secondary | ICD-10-CM | POA: Diagnosis present

## 2017-02-12 DIAGNOSIS — C7932 Secondary malignant neoplasm of cerebral meninges: Secondary | ICD-10-CM | POA: Diagnosis present

## 2017-02-12 LAB — I-STAT CG4 LACTIC ACID, ED
LACTIC ACID, VENOUS: 3.03 mmol/L — AB (ref 0.5–1.9)
Lactic Acid, Venous: 2.37 mmol/L (ref 0.5–1.9)

## 2017-02-12 LAB — COMPREHENSIVE METABOLIC PANEL
ALBUMIN: 3.6 g/dL (ref 3.5–5.0)
ALK PHOS: 56 U/L (ref 38–126)
ALT: 20 U/L (ref 14–54)
ANION GAP: 8 (ref 5–15)
AST: 24 U/L (ref 15–41)
BILIRUBIN TOTAL: 0.3 mg/dL (ref 0.3–1.2)
BUN: 12 mg/dL (ref 6–20)
CALCIUM: 8.4 mg/dL — AB (ref 8.9–10.3)
CO2: 19 mmol/L — AB (ref 22–32)
Chloride: 106 mmol/L (ref 101–111)
Creatinine, Ser: 0.61 mg/dL (ref 0.44–1.00)
GFR calc Af Amer: >60 mL/min (ref >60)
GFR calc non Af Amer: >60 mL/min (ref >60)
GLUCOSE: 135 mg/dL — AB (ref 65–99)
Potassium: 3.8 mmol/L (ref 3.5–5.1)
SODIUM: 133 mmol/L — AB (ref 135–145)
TOTAL PROTEIN: 6 g/dL — AB (ref 6.5–8.1)

## 2017-02-12 LAB — URINALYSIS, ROUTINE W REFLEX MICROSCOPIC
BILIRUBIN URINE: NEGATIVE
Glucose, UA: 50 mg/dL — AB
Hgb urine dipstick: NEGATIVE
Ketones, ur: NEGATIVE mg/dL
NITRITE: NEGATIVE
PH: 7 (ref 5.0–8.0)
Protein, ur: NEGATIVE mg/dL
RBC / HPF: NONE SEEN RBC/hpf (ref 0–5)
SPECIFIC GRAVITY, URINE: 1.01 (ref 1.005–1.030)

## 2017-02-12 LAB — CBC WITH DIFFERENTIAL/PLATELET
BASOS PCT: 0 %
Basophils Absolute: 0 10*3/uL (ref 0.0–0.1)
EOS PCT: 0 %
Eosinophils Absolute: 0 10*3/uL (ref 0.0–0.7)
HEMATOCRIT: 33 % — AB (ref 36.0–46.0)
HEMOGLOBIN: 10.5 g/dL — AB (ref 12.0–15.0)
LYMPHS PCT: 6 %
Lymphs Abs: 0.7 10*3/uL (ref 0.7–4.0)
MCH: 21 pg — AB (ref 26.0–34.0)
MCHC: 31.8 g/dL (ref 30.0–36.0)
MCV: 65.9 fL — AB (ref 78.0–100.0)
MONOS PCT: 3 %
Monocytes Absolute: 0.3 10*3/uL (ref 0.1–1.0)
NEUTROS ABS: 10.3 10*3/uL — AB (ref 1.7–7.7)
Neutrophils Relative %: 91 %
Platelets: 227 10*3/uL (ref 150–400)
RBC: 5.01 MIL/uL (ref 3.87–5.11)
RDW: 19.9 % — AB (ref 11.5–15.5)
WBC: 11.3 10*3/uL — AB (ref 4.0–10.5)

## 2017-02-12 LAB — PROTIME-INR
INR: 1.1
PROTHROMBIN TIME: 14.2 s (ref 11.4–15.2)

## 2017-02-12 MED ORDER — ACETAMINOPHEN 325 MG PO TABS
650.0000 mg | ORAL_TABLET | Freq: Once | ORAL | Status: AC
  Administered 2017-02-12: 650 mg via ORAL
  Filled 2017-02-12: qty 2

## 2017-02-12 MED ORDER — LACTATED RINGERS IV BOLUS (SEPSIS)
1000.0000 mL | Freq: Once | INTRAVENOUS | Status: AC
  Administered 2017-02-12: 1000 mL via INTRAVENOUS

## 2017-02-12 MED ORDER — FENTANYL CITRATE (PF) 100 MCG/2ML IJ SOLN
50.0000 ug | Freq: Once | INTRAMUSCULAR | Status: AC
  Administered 2017-02-12: 50 ug via INTRAVENOUS
  Filled 2017-02-12: qty 2

## 2017-02-12 MED ORDER — VANCOMYCIN HCL IN DEXTROSE 750-5 MG/150ML-% IV SOLN
750.0000 mg | Freq: Two times a day (BID) | INTRAVENOUS | Status: DC
  Administered 2017-02-13 – 2017-02-14 (×3): 750 mg via INTRAVENOUS
  Filled 2017-02-12 (×4): qty 150

## 2017-02-12 MED ORDER — SODIUM CHLORIDE 0.9 % IV SOLN
2.0000 g | Freq: Once | INTRAVENOUS | Status: AC
  Administered 2017-02-12: 2 g via INTRAVENOUS
  Filled 2017-02-12: qty 2000

## 2017-02-12 MED ORDER — VANCOMYCIN HCL IN DEXTROSE 1-5 GM/200ML-% IV SOLN
1000.0000 mg | Freq: Once | INTRAVENOUS | Status: AC
  Administered 2017-02-12: 1000 mg via INTRAVENOUS

## 2017-02-12 MED ORDER — DEXAMETHASONE SODIUM PHOSPHATE 10 MG/ML IJ SOLN
10.0000 mg | Freq: Once | INTRAMUSCULAR | Status: AC
  Administered 2017-02-12: 10 mg via INTRAVENOUS
  Filled 2017-02-12: qty 1

## 2017-02-12 MED ORDER — SODIUM CHLORIDE 0.9 % IV BOLUS (SEPSIS)
1000.0000 mL | Freq: Once | INTRAVENOUS | Status: AC
  Administered 2017-02-12: 1000 mL via INTRAVENOUS

## 2017-02-12 MED ORDER — VANCOMYCIN HCL IN DEXTROSE 1-5 GM/200ML-% IV SOLN
1000.0000 mg | Freq: Once | INTRAVENOUS | Status: DC
  Filled 2017-02-12: qty 200

## 2017-02-12 MED ORDER — DEXTROSE 5 % IV SOLN
2.0000 g | Freq: Three times a day (TID) | INTRAVENOUS | Status: DC
  Administered 2017-02-13 – 2017-02-14 (×4): 2 g via INTRAVENOUS
  Filled 2017-02-12 (×7): qty 2

## 2017-02-12 MED ORDER — LACTATED RINGERS IV BOLUS (SEPSIS)
1000.0000 mL | Freq: Once | INTRAVENOUS | Status: AC
Start: 2017-02-13 — End: 2017-02-13
  Administered 2017-02-13: 1000 mL via INTRAVENOUS

## 2017-02-12 MED ORDER — SODIUM CHLORIDE 0.9 % IV BOLUS (SEPSIS)
250.0000 mL | Freq: Once | INTRAVENOUS | Status: AC
  Administered 2017-02-12: 250 mL via INTRAVENOUS

## 2017-02-12 MED ORDER — CEFEPIME HCL 2 G IJ SOLR
2.0000 g | Freq: Once | INTRAMUSCULAR | Status: AC
  Administered 2017-02-12: 2 g via INTRAVENOUS
  Filled 2017-02-12: qty 2

## 2017-02-12 MED ORDER — SODIUM CHLORIDE 0.9 % IV SOLN
2.0000 g | INTRAVENOUS | Status: DC
  Administered 2017-02-13 (×3): 2 g via INTRAVENOUS
  Filled 2017-02-12 (×5): qty 2000

## 2017-02-12 MED ORDER — SODIUM CHLORIDE 0.9 % IV BOLUS (SEPSIS)
500.0000 mL | Freq: Once | INTRAVENOUS | Status: AC
  Administered 2017-02-12: 500 mL via INTRAVENOUS

## 2017-02-12 NOTE — ED Notes (Signed)
[facility] contacted to activate Code Sepsis 

## 2017-02-12 NOTE — ED Triage Notes (Signed)
Pt reports to the ED for eval of HA. She has hx of brain CA. She had chemo done on Friday and states that when they gave it to her she started hurting. Pt reports the pain radiates from her head down her spine. She has also developed some LLQ abd pain. Pt is febrile at 102.3 in the ED. Pt denies any numbness, tingling, vision changes, or unilateral weakness. She denies any N/V. Family denies confusion.

## 2017-02-12 NOTE — ED Provider Notes (Signed)
[facility] DEPT Provider Note   CSN: [id number] Arrival date & time: 02/12/17  2052     History   Chief Complaint Chief Complaint  Patient presents with  . Headache    HPI [name] is a 31 y.o. female.  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 Pt reportedly recently underwent access of her intracranial shunt for cytology several days ago. Since then, she has had fever, neck stifness, and severe headache. The HA is throbbing, sharp, and radiates down her leg. She has severe pain, fever, general malaise, and n/v. No diarrhea. No other areas of pain. No h/o similar hA. She has not been taking her decadron as it causes her weakness to worsen.  Past Medical History:  Diagnosis Date  . Anemia    hx  . Broken tooth 12/01/2015   left upper back  . Cancer ([place])   . Headache   . History of breast cancer 2015   left  . History of chemotherapy    finished 07/2015  . Multiple blisters 11/2015   tongue - states from chemo    Patient Active Problem List   Diagnosis Date Noted  . Cancer with leptomeningeal spread ([place]) 02/01/2017  . Metastatic breast cancer ([place])   . Palliative care by specialist   . DNR (do not resuscitate) discussion   . Brain metastases ([place]) 01/04/2017  . Goals of care, counseling/discussion 01/04/2017  . 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 Shingles 05/27/2016  . Genetic testing 09/22/2015  . Lymphedema 07/06/2015  . Arm pain 07/06/2015  . Heat intolerance 07/06/2015  . 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 Vaginal irritation 02/14/2015  . Breast cancer of lower-outer quadrant of left female breast ([place]) 07/26/2014    Past Surgical History:  Procedure Laterality Date  . APPLICATION OF CRANIAL NAVIGATION N/A 02/01/2017   Procedure: APPLICATION OF CRANIAL NAVIGATION;  Surgeon: Kevan Ny Ditty, MD;  Location: [facility];  Service: Neurosurgery;  Laterality:  N/A;  . BREAST LUMPECTOMY Left 07/10/2014   Procedure: LEFT BREAST LUMPECTOMY;  Surgeon: Merrie Roof, MD;  Location: [facility];  Service: General;  Laterality: Left;  . CESAREAN SECTION  09/09/2007  . CHEMO RESERVIOR INSERTION N/A 02/01/2017   Procedure: [place] placement with Brainlab;  Surgeon: Kevan Ny Ditty, MD;  Location: [facility];  Service: Neurosurgery;  Laterality: N/A;  [place] placement with Brain lab  . MASTECTOMY W/ SENTINEL NODE BIOPSY Left 09/06/2014   Procedure: LEFT MASTECTOMY WITH SENTINEL LYMPH NODE BIOPSY/NODE MAPPING;  Surgeon: Autumn Messing III, MD;  Location: [facility];  Service: General;  Laterality: Left;  . PORT-A-CATH REMOVAL N/A 12/08/2015   Procedure: REMOVAL PORT-A-CATH;  Surgeon: Autumn Messing III, MD;  Location: [facility];  Service: General;  Laterality: N/A;  . PORTACATH PLACEMENT Right 09/06/2014   Procedure: INSERTION PORT-A-CATH;  Surgeon: Autumn Messing III, MD;  Location: [facility];  Service: General;  Laterality: Right;    OB History    Gravida Para Term Preterm AB Living   2 2 2     2    SAB TAB Ectopic Multiple Live Births           1       Home Medications    Prior to Admission medications   Medication Sig Start Date End Date Taking? Authorizing Provider  acetaminophen (TYLENOL) 500 MG tablet Take 1,000 mg by mouth every 6 (  six) hours as needed for headache.   Yes Historical Provider, MD  dexamethasone (DECADRON) 2 MG tablet Take 1 tablet (2 mg total) by mouth 2 (two) times daily. Patient taking differently: Take 2 mg by mouth daily.  01/25/17  Yes Nicholas Lose, MD  diphenhydrAMINE (BENADRYL) 25 MG tablet Take 25-50 mg by mouth every 6 (six) hours as needed for itching or allergies.    Yes Historical Provider, MD  LORazepam (ATIVAN) 0.5 MG tablet Take 1 tablet (0.5 mg total) by mouth every 8 (eight) hours. Patient taking differently: Take 0.5 mg by mouth every 8 (eight) hours as needed for anxiety or sleep.  01/04/17   Yes Nicholas Lose, MD  omeprazole (PRILOSEC) 20 MG capsule Take 1 capsule (20 mg total) by mouth 2 (two) times daily. Patient taking differently: Take 20 mg by mouth daily.  01/03/17  Yes Tanna Furry, MD  oxyCODONE-acetaminophen (PERCOCET/ROXICET) 5-325 MG tablet Take 1 tablet by mouth every 4 (four) hours as needed for severe pain. 02/02/17  Yes Kevan Ny Ditty, MD  tamoxifen (NOLVADEX) 20 MG tablet Take 1 tablet (20 mg total) by mouth daily. 12/03/16  Yes Nicholas Lose, MD    Family History Family History  Problem Relation Age of Onset  . Diabetes Mother   . Hypertension Mother   . Diabetes Maternal Grandmother   . Hypertension Maternal Grandmother   . Stomach cancer Maternal Grandmother   . Stomach cancer Maternal Grandfather     Social History Social History  Substance Use Topics  . Smoking status: Never Smoker  . Smokeless tobacco: Never Used  . Alcohol use No     Allergies   Zofran [ondansetron hcl]   Review of Systems Review of Systems  Constitutional: Positive for appetite change and fatigue. Negative for fever.  Musculoskeletal: Positive for myalgias, neck pain and neck stiffness.  Neurological: Positive for headaches.  All other systems reviewed and are negative.    Physical Exam Updated Vital Signs BP (!) 88/51   Pulse 97   Temp (!) 102.3 F (39.1 C) (Oral)   Resp 19   SpO2 97%   Physical Exam  Constitutional: She is oriented to person, place, and time. She appears well-developed and well-nourished. She has a sickly appearance. She appears ill. She appears distressed.  HENT:  Head: Normocephalic and atraumatic.  Dry MM. Surgical site on scalp appears c/d/I. Recent puncture site over shunt clean. No erythema. No fluctuance.  Eyes: Conjunctivae are normal.  Neck: Muscular tenderness present. Neck rigidity present. Decreased range of motion present.  Cardiovascular: Regular rhythm and normal heart sounds.  Tachycardia present.  Exam reveals no friction  rub.   No murmur heard. Pulmonary/Chest: Effort normal and breath sounds normal. No respiratory distress. She has no wheezes. She has no rales.  Abdominal: Soft. There is no tenderness.  Musculoskeletal: She exhibits no edema.  Neurological: She is alert and oriented to person, place, and time. She has normal strength. She is not disoriented. No cranial nerve deficit or sensory deficit. She exhibits normal muscle tone. Gait normal. GCS eye subscore is 4. GCS verbal subscore is 5. GCS motor subscore is 6.  Skin: Skin is warm. Capillary refill takes less than 2 seconds.  Psychiatric: She has a normal mood and affect.  Nursing note and vitals reviewed.    ED Treatments / Results  Labs (all labs ordered are listed, but only abnormal results are displayed) Labs Reviewed  COMPREHENSIVE METABOLIC PANEL - Abnormal; Notable for the following:  Result Value   Sodium 133 (*)    CO2 19 (*)    Glucose, Bld 135 (*)    Calcium 8.4 (*)    Total Protein 6.0 (*)    All other components within normal limits  CBC WITH DIFFERENTIAL/PLATELET - Abnormal; Notable for the following:    WBC 11.3 (*)    Hemoglobin 10.5 (*)    HCT 33.0 (*)    MCV 65.9 (*)    MCH 21.0 (*)    RDW 19.9 (*)    Neutro Abs 10.3 (*)    All other components within normal limits  URINALYSIS, ROUTINE W REFLEX MICROSCOPIC - Abnormal; Notable for the following:    Color, Urine STRAW (*)    Glucose, UA 50 (*)    Leukocytes, UA MODERATE (*)    Bacteria, UA RARE (*)    Squamous Epithelial / LPF 0-5 (*)    All other components within normal limits  I-STAT CG4 LACTIC ACID, ED - Abnormal; Notable for the following:    Lactic Acid, Venous 2.37 (*)    All other components within normal limits  I-STAT CG4 LACTIC ACID, ED - Abnormal; Notable for the following:    Lactic Acid, Venous 3.03 (*)    All other components within normal limits  CULTURE, BLOOD (ROUTINE X 2)  CULTURE, BLOOD (ROUTINE X 2)  URINE CULTURE  PROTIME-INR    I-STAT CG4 LACTIC ACID, ED  I-STAT CG4 LACTIC ACID, ED    EKG  EKG Interpretation None       Radiology Ct Head Wo Contrast  Result Date: 02/12/2017 CLINICAL DATA:  Acute onset of headache. Current history of brain cancer. Initial encounter. EXAM: CT HEAD WITHOUT CONTRAST TECHNIQUE: Contiguous axial images were obtained from the base of the skull through the vertex without intravenous contrast. COMPARISON:  CT of the head performed 02/01/2017, and MRI of the brain performed 01/03/2002 FINDINGS: Brain: The known cerebellar masses are not well characterized on provided images, with mild underlying edema again noted. Evaluation is somewhat suboptimal due to beam hardening artifact. There is no evidence of hemorrhagic transformation. No acute infarct is seen. A right-sided chemotherapy reservoir is grossly unremarkable in appearance, ending at the frontal horn of the right lateral ventricle. The third and lateral ventricles, and basal ganglia are unremarkable in appearance. The cerebral hemispheres are symmetric in appearance, with normal gray-white differentiation. No mass effect or midline shift is seen. Vascular: No hyperdense vessel or unexpected calcification. Skull: There is no evidence of fracture; visualized osseous structures are unremarkable in appearance. Sinuses/Orbits: The orbits are within normal limits. The paranasal sinuses and mastoid air cells are well-aerated. Other: Skin staples are seen overlying the high right parietal calvarium. IMPRESSION: 1. Known cerebellar masses are not well characterized on provided images, with mild underlying edema again noted. 2. Right-sided chemotherapy reservoir is grossly unremarkable. Electronically Signed   By: Garald Balding M.D.   On: 02/12/2017 23:06    Procedures Procedures (including critical care time)  Medications Ordered in ED Medications  lactated ringers bolus 1,000 mL (1,000 mLs Intravenous New Bag/Given 02/12/17 2309)  vancomycin  (VANCOCIN) IVPB 750 mg/150 ml premix (not administered)  ceFEPIme (MAXIPIME) 2 g in dextrose 5 % 50 mL IVPB (not administered)  ampicillin (OMNIPEN) 2 g in sodium chloride 0.9 % 50 mL IVPB (not administered)  lactated ringers bolus 1,000 mL (not administered)  hydrocortisone sodium succinate (SOLU-CORTEF) 100 MG injection 100 mg (not administered)  acetaminophen (TYLENOL) tablet 650 mg (650 mg Oral  Given 02/12/17 2140)  sodium chloride 0.9 % bolus 1,000 mL (0 mLs Intravenous Stopped 02/12/17 2215)    And  sodium chloride 0.9 % bolus 500 mL (0 mLs Intravenous Stopped 02/12/17 2254)    And  sodium chloride 0.9 % bolus 250 mL (0 mLs Intravenous Stopped 02/12/17 2244)  dexamethasone (DECADRON) injection 10 mg (10 mg Intravenous Given 02/12/17 2154)  ampicillin (OMNIPEN) 2 g in sodium chloride 0.9 % 50 mL IVPB (0 g Intravenous Stopped 02/12/17 2210)  ceFEPIme (MAXIPIME) 2 g in dextrose 5 % 50 mL IVPB (0 g Intravenous Stopped 02/12/17 2245)  vancomycin (VANCOCIN) IVPB 1000 mg/200 mL premix (0 mg Intravenous Stopped 02/12/17 2257)  fentaNYL (SUBLIMAZE) injection 50 mcg (50 mcg Intravenous Given 02/12/17 2152)    CRITICAL CARE Performed by: Evonnie Pat   Total critical care time: 35 minutes  Critical care time was exclusive of separately billable procedures and treating other patients.  Critical care was necessary to treat or prevent imminent or life-threatening deterioration.  Critical care was time spent personally by me on the following activities: development of treatment plan with patient and/or surrogate as well as nursing, discussions with consultants, evaluation of patient's response to treatment, examination of patient, obtaining history from patient or surrogate, ordering and performing treatments and interventions, ordering and review of laboratory studies, ordering and review of radiographic studies, pulse oximetry and re-evaluation of patient's condition.     Initial Impression /  Assessment and Plan / ED Course  I have reviewed the triage vital signs and the nursing notes.  Pertinent labs & imaging results that were available during my care of the patient were reviewed by me and considered in my medical decision making (see chart for details).     31 yo F with h/o metastatic breast CA with leptomeningeal spread here with severe HA, fever, neck stiffness. Concern for acute bacterial meningitis, esp in setting of recent shunting and access of shunt. On arrival, pt febrile, tachycardic, ill-appearing but alert w/o focal neuro deficits. Pt given IV steroids, broad-spectrum abx coverage. D/w Dr. Vertell Limber of [facility] - he recommends IV ABX, and Dr. Cyndy Freeze will see pt tomorrow re: shunt. Continue fluids. She has borderline BP but this is not far from baseline and LA is <4 - will admit to SDU.  D/t pt's borderline BP, I d/w Dr. Detterding of ICU. Given good UOP, good mentation, baseline low BP (SBP baseline 90s), she does not feel pressors, ICU needed at this time. Will repeat LA.  LA increased to 3.03. Will continue fluids. Concern for ongoing sepsis versus shock - may need pressors if BP does not improve. Will admit to ICU. Of note, pt on chronic dexamethasone - will give stress dose solucortef as well in case of adrenal insufficiency.  Final Clinical Impressions(s) / ED Diagnoses   Final diagnoses:  Bacterial meningitis  Sepsis, due to unspecified organism ([place])  Lactic acidosis      Duffy Bruce, MD 02/13/17 1143

## 2017-02-12 NOTE — ED Notes (Addendum)
Pt took her Tomaxefin.

## 2017-02-12 NOTE — Progress Notes (Signed)
Pharmacy Antibiotic Note  [name] is a 31 y.o. female admitted on 02/12/2017 with headache.  Pharmacy has been consulted for Vancomycin/Cefepime/Ampicillin dosing for rule out meningitis. Pt has breast CA with mets to the brain. WBC 11.3. Renal function good.   Plan: Vancomycin 750 mg IV q12h Cefepime 2g IV q8h Ampicillin 2g IV q4h Trend WBC, temp, renal function  F/U infectious work-up Drug levels as indicated F/U meningitis work-up   Temp (24hrs), Avg:102.3 F (39.1 C), Min:102.3 F (39.1 C), Max:102.3 F (39.1 C)   Recent Labs Lab 02/07/17 0930 02/07/17 0930 02/12/17 2146 02/12/17 2150  WBC 7.2  --  11.3*  --   CREATININE  --  0.6 0.61  --   LATICACIDVEN  --   --   --  2.37*    Estimated Creatinine Clearance: 73.9 mL/min (by C-G formula based on SCr of 0.61 mg/dL).    Allergies  Allergen Reactions  . Zofran [Ondansetron Hcl] Itching    Narda Bonds 02/12/2017 11:28 PM

## 2017-02-13 DIAGNOSIS — C7932 Secondary malignant neoplasm of cerebral meninges: Secondary | ICD-10-CM | POA: Diagnosis present

## 2017-02-13 DIAGNOSIS — R51 Headache: Secondary | ICD-10-CM | POA: Diagnosis present

## 2017-02-13 DIAGNOSIS — D649 Anemia, unspecified: Secondary | ICD-10-CM | POA: Diagnosis present

## 2017-02-13 DIAGNOSIS — Z923 Personal history of irradiation: Secondary | ICD-10-CM | POA: Diagnosis not present

## 2017-02-13 DIAGNOSIS — G936 Cerebral edema: Secondary | ICD-10-CM | POA: Diagnosis present

## 2017-02-13 DIAGNOSIS — G038 Meningitis due to other specified causes: Secondary | ICD-10-CM | POA: Diagnosis present

## 2017-02-13 DIAGNOSIS — G009 Bacterial meningitis, unspecified: Secondary | ICD-10-CM

## 2017-02-13 DIAGNOSIS — E872 Acidosis, unspecified: Secondary | ICD-10-CM

## 2017-02-13 DIAGNOSIS — Z9689 Presence of other specified functional implants: Secondary | ICD-10-CM | POA: Diagnosis not present

## 2017-02-13 DIAGNOSIS — C50512 Malignant neoplasm of lower-outer quadrant of left female breast: Secondary | ICD-10-CM | POA: Diagnosis present

## 2017-02-13 DIAGNOSIS — Z800 Family history of malignant neoplasm of digestive organs: Secondary | ICD-10-CM | POA: Diagnosis not present

## 2017-02-13 DIAGNOSIS — A419 Sepsis, unspecified organism: Secondary | ICD-10-CM

## 2017-02-13 DIAGNOSIS — I9589 Other hypotension: Secondary | ICD-10-CM | POA: Diagnosis present

## 2017-02-13 DIAGNOSIS — T451X5A Adverse effect of antineoplastic and immunosuppressive drugs, initial encounter: Secondary | ICD-10-CM | POA: Diagnosis present

## 2017-02-13 DIAGNOSIS — Z79810 Long term (current) use of selective estrogen receptor modulators (SERMs): Secondary | ICD-10-CM | POA: Diagnosis not present

## 2017-02-13 DIAGNOSIS — Z888 Allergy status to other drugs, medicaments and biological substances status: Secondary | ICD-10-CM | POA: Diagnosis not present

## 2017-02-13 LAB — GLUCOSE, CAPILLARY
GLUCOSE-CAPILLARY: 140 mg/dL — AB (ref 65–99)
GLUCOSE-CAPILLARY: 165 mg/dL — AB (ref 65–99)
GLUCOSE-CAPILLARY: 178 mg/dL — AB (ref 65–99)
Glucose-Capillary: 140 mg/dL — ABNORMAL HIGH (ref 65–99)
Glucose-Capillary: 135 mg/dL — ABNORMAL HIGH (ref 65–99)
Glucose-Capillary: 167 mg/dL — ABNORMAL HIGH (ref 65–99)

## 2017-02-13 LAB — CBC
HCT: 29.8 % — ABNORMAL LOW (ref 36.0–46.0)
HEMOGLOBIN: 9.8 g/dL — AB (ref 12.0–15.0)
MCH: 21.5 pg — ABNORMAL LOW (ref 26.0–34.0)
MCHC: 32.9 g/dL (ref 30.0–36.0)
MCV: 65.4 fL — ABNORMAL LOW (ref 78.0–100.0)
Platelets: 257 10*3/uL (ref 150–400)
RBC: 4.56 MIL/uL (ref 3.87–5.11)
RDW: 20.1 % — ABNORMAL HIGH (ref 11.5–15.5)
WBC: 9.9 10*3/uL (ref 4.0–10.5)

## 2017-02-13 LAB — BASIC METABOLIC PANEL
ANION GAP: 8 (ref 5–15)
BUN: 7 mg/dL (ref 6–20)
CALCIUM: 8.1 mg/dL — AB (ref 8.9–10.3)
CHLORIDE: 113 mmol/L — AB (ref 101–111)
CO2: 22 mmol/L (ref 22–32)
CREATININE: 0.51 mg/dL (ref 0.44–1.00)
GFR calc non Af Amer: >60 mL/min (ref >60)
Glucose, Bld: 148 mg/dL — ABNORMAL HIGH (ref 65–99)
Potassium: 3.6 mmol/L (ref 3.5–5.1)
SODIUM: 143 mmol/L (ref 135–145)

## 2017-02-13 LAB — CSF CELL COUNT WITH DIFFERENTIAL
Lymphs, CSF: 25 % — ABNORMAL LOW (ref 40–80)
MONOCYTE-MACROPHAGE-SPINAL FLUID: 57 % — AB (ref 15–45)
RBC Count, CSF: 1884 /mm3 — ABNORMAL HIGH
Segmented Neutrophils-CSF: 18 % — ABNORMAL HIGH (ref 0–6)
TUBE #: 1
WBC CSF: 21 /mm3 — AB (ref 0–5)

## 2017-02-13 LAB — PROTEIN AND GLUCOSE, CSF
GLUCOSE CSF: 67 mg/dL (ref 40–70)
TOTAL PROTEIN, CSF: 39 mg/dL (ref 15–45)

## 2017-02-13 LAB — MAGNESIUM: MAGNESIUM: 1.7 mg/dL (ref 1.7–2.4)

## 2017-02-13 LAB — PHOSPHORUS: PHOSPHORUS: 2.9 mg/dL (ref 2.5–4.6)

## 2017-02-13 LAB — MRSA PCR SCREENING: MRSA by PCR: NEGATIVE

## 2017-02-13 MED ORDER — SODIUM CHLORIDE 0.9 % IV BOLUS (SEPSIS)
1000.0000 mL | Freq: Once | INTRAVENOUS | Status: AC
  Administered 2017-02-13: 1000 mL via INTRAVENOUS

## 2017-02-13 MED ORDER — FENTANYL CITRATE (PF) 100 MCG/2ML IJ SOLN
12.5000 ug | INTRAMUSCULAR | Status: DC | PRN
  Administered 2017-02-13 (×2): 25 ug via INTRAVENOUS
  Filled 2017-02-13: qty 2

## 2017-02-13 MED ORDER — ENOXAPARIN SODIUM 30 MG/0.3ML ~~LOC~~ SOLN
30.0000 mg | SUBCUTANEOUS | Status: DC
  Administered 2017-02-13 – 2017-02-14 (×2): 30 mg via SUBCUTANEOUS
  Filled 2017-02-13 (×2): qty 0.3

## 2017-02-13 MED ORDER — SODIUM CHLORIDE 0.9 % IV SOLN: 250.0000 mL | INTRAVENOUS | Status: DC | PRN

## 2017-02-13 MED ORDER — HYDROCORTISONE NA SUCCINATE PF 100 MG IJ SOLR
100.0000 mg | Freq: Once | INTRAMUSCULAR | Status: AC
Start: 2017-02-13 — End: 2017-02-13
  Administered 2017-02-13: 100 mg via INTRAVENOUS
  Filled 2017-02-13: qty 2

## 2017-02-13 MED ORDER — DEXAMETHASONE SODIUM PHOSPHATE 10 MG/ML IJ SOLN
6.0000 mg | Freq: Four times a day (QID) | INTRAMUSCULAR | Status: DC
  Administered 2017-02-13 – 2017-02-14 (×5): 6 mg via INTRAVENOUS
  Filled 2017-02-13 (×3): qty 1
  Filled 2017-02-13: qty 0.6
  Filled 2017-02-13: qty 1

## 2017-02-13 MED ORDER — FENTANYL CITRATE (PF) 100 MCG/2ML IJ SOLN
INTRAMUSCULAR | Status: AC
  Filled 2017-02-13: qty 2

## 2017-02-13 MED ORDER — SODIUM CHLORIDE 0.9 % IV BOLUS (SEPSIS)
500.0000 mL | Freq: Once | INTRAVENOUS | Status: AC
  Administered 2017-02-13: 500 mL via INTRAVENOUS

## 2017-02-13 NOTE — Progress Notes (Signed)
eLink Physician-Brief Progress Note Patient Name: [name] DOB: 13-Jun-1986 MRN: [id number]   Date of Service  02/13/2017  HPI/Events of Note  Transferred to Med-Surg Floor on order of Dr. Titus Mould. Still has telemetry order.  eICU Interventions  Will D/C telemetry order.     Intervention Category Minor Interventions: Routine modifications to care plan (e.g. PRN medications for pain, fever)  Sommer,Steven Eugene 02/13/2017, 6:32 PM

## 2017-02-13 NOTE — Progress Notes (Signed)
CRITICAL VALUE ALERT  Critical value received:  WBC in CSF was 21  Date of notification:  02/13/17  Time of notification:  0459  Critical value read back:Yes.    Nurse who received alert:  Alphonzo Lemmings, RN  MD notified (1st page):  Called Dr. Cyndy Freeze office and left message with answering service  Time of first page:  [phone]  MD notified (2nd page):  Time of second page:  Responding MD:  Ferne Reus, PA  Time MD responded:  [phone]

## 2017-02-13 NOTE — Progress Notes (Signed)
Pt transferred to 5W34 per MD order. Report called to RN and pt transferred via bed with 1 bag of clothes and 1 personal cell phone. Pt had family members at bedside and they are aware of the transfer.

## 2017-02-13 NOTE — Consult Note (Signed)
CC:  Chief Complaint  Patient presents with  . Headache    HPI: [name] is a 31 y.o. female, pmhx of metastatic breast cancer with leptomeningeal spread s/p omaya reservoir placement, who presented to ER for headache, fever (max temp 104F reported), neck stiffness. Reportedly underwent access to [facility] for cytology several days ago. Symptoms started roughly 12-24 hours after. HA - frontal and occipital. Described as a tightness. Extends down to neck. Endorses photophobia. Symptoms have improved since arrival. No other areas of pain. No history of similar headache. Denies focal deficits. Currently admitted for sepsis with concern for meningitis.  PMH: Past Medical History:  Diagnosis Date  . Anemia    hx  . Broken tooth 12/01/2015   left upper back  . Cancer ([place])   . Headache   . History of breast cancer 2015   left  . History of chemotherapy    finished 07/2015  . Multiple blisters 11/2015   tongue - states from chemo    PSH: Past Surgical History:  Procedure Laterality Date  . APPLICATION OF CRANIAL NAVIGATION N/A 02/01/2017   Procedure: APPLICATION OF CRANIAL NAVIGATION;  Surgeon: Kevan Ny Ditty, MD;  Location: [facility];  Service: Neurosurgery;  Laterality: N/A;  . BREAST LUMPECTOMY Left 07/10/2014   Procedure: LEFT BREAST LUMPECTOMY;  Surgeon: Merrie Roof, MD;  Location: [facility];  Service: General;  Laterality: Left;  . CESAREAN SECTION  09/09/2007  . CHEMO RESERVIOR INSERTION N/A 02/01/2017   Procedure: [place] placement with Brainlab;  Surgeon: Kevan Ny Ditty, MD;  Location: [facility];  Service: Neurosurgery;  Laterality: N/A;  [place] placement with Brain lab  . MASTECTOMY W/ SENTINEL NODE BIOPSY Left 09/06/2014   Procedure: LEFT MASTECTOMY WITH SENTINEL LYMPH NODE BIOPSY/NODE MAPPING;  Surgeon: Autumn Messing III, MD;  Location: [facility];  Service: General;  Laterality: Left;  . PORT-A-CATH REMOVAL N/A 12/08/2015   Procedure: REMOVAL PORT-A-CATH;  Surgeon: Autumn Messing III, MD;  Location: [facility];  Service: General;  Laterality: N/A;  . PORTACATH PLACEMENT Right 09/06/2014   Procedure: INSERTION PORT-A-CATH;  Surgeon: Autumn Messing III, MD;  Location: [facility];  Service: General;  Laterality: Right;    SH: Social History  Substance Use Topics  . Smoking status: Never Smoker  . Smokeless tobacco: Never Used  . Alcohol use No    MEDS: Prior to Admission medications   Medication Sig Start Date End Date Taking? Authorizing Provider  acetaminophen (TYLENOL) 500 MG tablet Take 1,000 mg by mouth every 6 (six) hours as needed for headache.   Yes Historical Provider, MD  dexamethasone (DECADRON) 2 MG tablet Take 1 tablet (2 mg total) by mouth 2 (two) times daily. Patient taking differently: Take 2 mg by mouth daily.  01/25/17  Yes Nicholas Lose, MD  diphenhydrAMINE (BENADRYL) 25 MG tablet Take 25-50 mg by mouth every 6 (six) hours as needed for itching or allergies.    Yes Historical Provider, MD  LORazepam (ATIVAN) 0.5 MG tablet Take 1 tablet (0.5 mg total) by mouth every 8 (eight) hours. Patient taking differently: Take 0.5 mg by mouth every 8 (eight) hours as needed for anxiety or sleep.  01/04/17  Yes Nicholas Lose, MD  omeprazole (PRILOSEC) 20 MG capsule Take 1 capsule (20 mg total) by mouth 2 (two) times daily. Patient taking differently: Take 20 mg by mouth daily.  01/03/17  Yes Tanna Furry, MD  oxyCODONE-acetaminophen (PERCOCET/ROXICET) 5-325 MG tablet Take 1 tablet  by mouth every 4 (four) hours as needed for severe pain. 02/02/17  Yes Kevan Ny Ditty, MD  tamoxifen (NOLVADEX) 20 MG tablet Take 1 tablet (20 mg total) by mouth daily. 12/03/16  Yes Nicholas Lose, MD    ALLERGY: Allergies  Allergen Reactions  . Zofran [Ondansetron Hcl] Itching    ROS: Review of Systems  Constitutional: Positive for chills, fever and malaise/fatigue.  HENT: Negative for hearing loss and tinnitus.   Eyes:  Negative.   Cardiovascular: Negative.   Gastrointestinal: Negative.   Genitourinary: Negative.   Musculoskeletal: Positive for myalgias and neck pain. Negative for back pain and joint pain.  Skin: Negative for itching and rash.  Neurological: Positive for headaches. Negative for dizziness, tingling, tremors, sensory change, speech change, focal weakness, seizures and loss of consciousness.    Vitals:   02/13/17 1200 02/13/17 1300  BP: 92/68 96/65  Pulse: 73 70  Resp: (!) 21 (!) 21  Temp:     General appearance: NAD, resting comfortably in room Eyes: PERRL Cardiovascular: Regular rate and rhythm without murmurs, rubs, gallops. No edema or variciosities. Distal pulses normal. Pulmonary: Clear to auscultation Musculoskeletal:     Neck: + rigidity with movement    Muscle tone upper extremities: Normal    Muscle tone lower extremities: Normal    Motor exam: Upper Extremities Deltoid Bicep Tricep Grip  Right 5/5 5/5 5/5 5/5  Left 5/5 5/5 5/5 5/5   Lower Extremity IP Quad PF DF EHL  Right 5/5 5/5 5/5 5/5 5/5  Left 5/5 5/5 5/5 5/5 5/5   Neurological Awake, alert, oriented Memory and concentration grossly intact Speech fluent, appropriate CNII: Visual fields normal CNIII/IV/VI: EOMI CNV: Facial sensation normal CNVII: Symmetric, normal strength CNVIII: Grossly normal CNIX: Normal palate movement CNXI: Trap and SCM strength normal CN XII: Tongue protrusion normal Sensation grossly intact to LT  IMAGING: CT HEAD IMPRESSION: 1. Known cerebellar masses are not well characterized on provided images, with mild underlying edema again noted. 2. Right-sided chemotherapy reservoir is grossly unremarkable.  IMPRESSION/PLAN: - 31 y.o. female with sepsis secondary to possible meningitis. She as started on vanc/cefepime/amp and steroids. Dr Marland Kitchen Ditty will perform aspiration of ommaya reservoir for further evaluation. Risks/benefits of procedure were discussed. Pt and family  present understand and state in their own words the importance. They wish to proceed. Will need chloraprep and LP kit at bedside.  Call for any concerns.

## 2017-02-13 NOTE — H&P (Addendum)
PULMONARY / CRITICAL CARE MEDICINE   Name: Solangel Mcmanaway Cook MRN: [id number] DOB: 08/11/1986    ADMISSION DATE:  02/12/2017 CONSULTATION DATE:    REFERRING MD:  EDP  CHIEF COMPLAINT:  Headache, elevated lactate, fever  HISTORY OF PRESENT ILLNESS:   [name] is a 19F with PMH significant for metastatic breast cancer with leptomeningeal carcinomatosis s/p chemotherapy, radiation, and recent placement of an Ommaya reservoir for intrathecal chemotherapy. She presented to the ED from home with complaints of headache, fever, LLQ abdominal pain. Headache began after CSF was obtained for cytology. Neurosurgery recommends antibiotics, steroids, and will see in consultation.    PAST MEDICAL HISTORY :  She  has a past medical history of Anemia; Broken tooth (12/01/2015); Cancer ([place]); Headache; History of breast cancer (2015); History of chemotherapy; and Multiple blisters (11/2015).  PAST SURGICAL HISTORY: She  has a past surgical history that includes Cesarean section (09/09/2007); Breast lumpectomy (Left, 07/10/2014); Portacath placement (Right, 09/06/2014); Mastectomy w/ sentinel node biopsy (Left, 09/06/2014); Port-a-cath removal (N/A, 12/08/2015); Chemo reservior insertion (N/A, 8/84/1660); and Application of cranial navigation (N/A, 02/01/2017).  Allergies  Allergen Reactions  . Zofran [Ondansetron Hcl] Itching    Current Facility-Administered Medications on File Prior to Encounter  Medication  . sodium chloride 0.9 % injection 10 mL   Current Outpatient Prescriptions on File Prior to Encounter  Medication Sig  . acetaminophen (TYLENOL) 500 MG tablet Take 1,000 mg by mouth every 6 (six) hours as needed for headache.  . dexamethasone (DECADRON) 2 MG tablet Take 1 tablet (2 mg total) by mouth 2 (two) times daily. (Patient taking differently: Take 2 mg by mouth daily. )  . diphenhydrAMINE (BENADRYL) 25 MG tablet Take 25-50 mg by mouth every 6 (six) hours as needed for  itching or allergies.   Marland Kitchen LORazepam (ATIVAN) 0.5 MG tablet Take 1 tablet (0.5 mg total) by mouth every 8 (eight) hours. (Patient taking differently: Take 0.5 mg by mouth every 8 (eight) hours as needed for anxiety or sleep. )  . omeprazole (PRILOSEC) 20 MG capsule Take 1 capsule (20 mg total) by mouth 2 (two) times daily. (Patient taking differently: Take 20 mg by mouth daily. )  . oxyCODONE-acetaminophen (PERCOCET/ROXICET) 5-325 MG tablet Take 1 tablet by mouth every 4 (four) hours as needed for severe pain.  . tamoxifen (NOLVADEX) 20 MG tablet Take 1 tablet (20 mg total) by mouth daily.    FAMILY HISTORY:  Her indicated that her mother is alive. She indicated that her father is alive. She indicated that her sister is alive. She indicated that her brother is alive. She indicated that the status of her maternal grandmother is unknown. She indicated that the status of her maternal grandfather is unknown.    SOCIAL HISTORY: She  reports that she has never smoked. She has never used smokeless tobacco. She reports that she does not drink alcohol or use drugs.  REVIEW OF SYSTEMS:   Unable  At this time  SUBJECTIVE:  No pressors Bp wnl headache  VITAL SIGNS: BP 91/60   Pulse 82   Temp 99 F (37.2 C) (Oral)   Resp 14   Wt 53.7 kg (118 lb 6.2 oz)   SpO2 96%   BMI 23.12 kg/m   HEMODYNAMICS:    VENTILATOR SETTINGS:    INTAKE / OUTPUT: I/O last 3 completed shifts: In: 6301 [I.V.:10; IV Piggyback:4150] Out: 600 [Urine:600]  PHYSICAL EXAMINATION:  General:awake, alert Neuro: awake, alert, nonfocal, no stiffness neck, perrl HEENT: jvd low  PULM: CTA CV:  s1 s2 RRR, no tachy GI: BS wnl, no r/g Extremities: edema    LABS:  BMET  Recent Labs Lab 02/07/17 0930 02/12/17 2146 02/13/17 0224  NA 139 133* 143  K 3.9 3.8 3.6  CL  --  106 113*  CO2 26 19* 22  BUN 10._0 CREATININE 0.6 0.61 0.51  GLUCOSE 84 135* 148*    Electrolytes  Recent Labs Lab  02/07/17 0930 02/12/17 2146 02/13/17 0224  CALCIUM 9.1 8.4* 8.1*  MG  --   --  1.7  PHOS  --   --  2.9    CBC  Recent Labs Lab 02/07/17 0930 02/12/17 2146 02/13/17 0224  WBC 7.2 11.3* 9.9  HGB 11.5* 10.5* 9.8*  HCT 35.3 33.0* 29.8*  PLT 171 227 257    Coag's  Recent Labs Lab 02/12/17 2146  INR 1.10    Sepsis Markers  Recent Labs Lab 02/12/17 2150 02/12/17 2357  LATICACIDVEN 2.37* 3.03*    ABG No results for input(s): PHART, PCO2ART, PO2ART in the last 168 hours.  Liver Enzymes  Recent Labs Lab 02/07/17 0930 02/12/17 2146  AST 13 24  ALT 18 20  ALKPHOS 74 56  BILITOT 0.47 0.3  ALBUMIN 3.2* 3.6    Cardiac Enzymes No results for input(s): TROPONINI, PROBNP in the last 168 hours.  Glucose  Recent Labs Lab 02/13/17 0334 02/13/17 0745 02/13/17 1148  GLUCAP 140* 135* 140*    Imaging Ct Head Wo Contrast  Result Date: 02/12/2017 CLINICAL DATA:  Acute onset of headache. Current history of brain cancer. Initial encounter. EXAM: CT HEAD WITHOUT CONTRAST TECHNIQUE: Contiguous axial images were obtained from the base of the skull through the vertex without intravenous contrast. COMPARISON:  CT of the head performed 02/01/2017, and MRI of the brain performed 01/03/2002 FINDINGS: Brain: The known cerebellar masses are not well characterized on provided images, with mild underlying edema again noted. Evaluation is somewhat suboptimal due to beam hardening artifact. There is no evidence of hemorrhagic transformation. No acute infarct is seen. A right-sided chemotherapy reservoir is grossly unremarkable in appearance, ending at the frontal horn of the right lateral ventricle. The third and lateral ventricles, and basal ganglia are unremarkable in appearance. The cerebral hemispheres are symmetric in appearance, with normal gray-white differentiation. No mass effect or midline shift is seen. Vascular: No hyperdense vessel or unexpected calcification. Skull: There is  no evidence of fracture; visualized osseous structures are unremarkable in appearance. Sinuses/Orbits: The orbits are within normal limits. The paranasal sinuses and mastoid air cells are well-aerated. Other: Skin staples are seen overlying the high right parietal calvarium. IMPRESSION: 1. Known cerebellar masses are not well characterized on provided images, with mild underlying edema again noted. 2. Right-sided chemotherapy reservoir is grossly unremarkable. Electronically Signed   By: Garald Balding M.D.   On: 02/12/2017 23:06   STUDIES:    CULTURES: Blood cx 3/31  ANTIBIOTICS: Vanc/cefepime/amp 3/31 >>  SIGNIFICANT EVENTS:   LINES/TUBES: PIV  DISCUSSION: Mrs. Phegley is a 49F with PMH significant for metastatic breast cancer s/p recent Ommaya reservoir placement for intrathecal chemotherapy and first dose on 02/10/17. She presents with fever and meningitic signs concerning for meningitis. No other localizing signs or symptoms.   ASSESSMENT / PLAN: INFECTIOUS A:   Concern for meningitis, infected new reservoir? P:   Continue broad spectrum meningitis coverage ( nosocomial ) Await neurosurgical input, called Decadron, maintain for now  PULMONARY A: No acute issues P:  IS Upright when able  CARDIOVASCULAR A:  Borderline blood pressures - chronic Elevated lactate P:  MAP goal > 65, met on own Trend lactate 3, bolus again Stress dose steroids  As decadron  RENAL A:   LActic acidosis P:   Trend BMP Replace electrolytes as needed  bolus  GASTROINTESTINAL A:   No acute issues P:   Advance diet  HEMATOLOGIC A:   Metastatic breast cancer on active (palliative) treatment P:  Await oncology input  ENDOCRINE A:   No acute issues   P:   CBGs  NEUROLOGIC A:   Concern for meningitis P:   RASS goal: 0 Follow neuro exam Continue antibiotics, with recent drain , I don't see need AMP as not likely community meningitis  Calling NS, need consider  sample from reservoir  FAMILY  - Updates: family at bedside, updated  - Inter-disciplinary family meet or Palliative Care meeting due by:  day 7  Ccm time 45 min  Lavon Paganini. Titus Mould, MD, [place] Pgr: [facility]

## 2017-02-13 NOTE — Procedures (Signed)
Consent for procedure obtained. Skin over Ommaya reservoir prepped in the usual fashion with Chloraprep. It was accessed with a 21g butterfly needle.  Clear CSF was easily aspirated.  It was collected in sterile tubes and sent for routine labs.  A band aid was applied after the needle was removed.

## 2017-02-13 NOTE — Progress Notes (Signed)
Per Margaretmary Bayley RN, neurosurgeon MD called and informed RN that this patient will not be having surgery tomorrow and that the RN can cancel the NPO at midnight order for the patient. Message passed from [place], [place] to this RN. This RN will cancel the NPO order and inform the patient there will be no surgery tomorrow.

## 2017-02-13 NOTE — H&P (Signed)
PULMONARY / CRITICAL CARE MEDICINE   Name: [name] MRN: [id number] DOB: 08-Jun-1986    ADMISSION DATE:  02/12/2017 CONSULTATION DATE:    REFERRING MD:  EDP  CHIEF COMPLAINT:  Headache, elevated lactate, fever  HISTORY OF PRESENT ILLNESS:   [name] is a 50F with PMH significant for metastatic breast cancer with leptomeningeal carcinomatosis s/p chemotherapy, radiation, and recent placement of an Ommaya reservoir for intrathecal chemotherapy. She presented to the ED from home with complaints of headache, fever, LLQ abdominal pain. Headache began after CSF was obtained for cytology. Neurosurgery recommends antibiotics, steroids, and will see in consultation.   Initial labs notable for mildly increased WBC from prior at 11.3 with left shift, anemia with Hgb 10.5, PLTs 227, lactate initially 2.37, increased to 3.03 2 hours later despite 3.5L IVF, UA largely benign, INR 1.1.  Out of concern for meningitis, she was started on vanc/cefepime/amp as well as steroids.   Additional history obtained with assistance of ED interpreter.  Currently she endorses that her headache is greatly improved. She still has some sensitivity to light. She has less pain with movement of her neck. Her pain is best when she is lying flat; other positions make it worse. Her symptoms started on 3/30, 12-24h after her first dose of intrathecal chemotherapy. Her incision in healing well with no new tenderness, warmth, erythema or drainage. She denies nausea / vomiting, rashes, numbness / tingling / weakness, visual changes, urinary issues.   PAST MEDICAL HISTORY :  She  has a past medical history of Anemia; Broken tooth (12/01/2015); Cancer ([place]); Headache; History of breast cancer (2015); History of chemotherapy; and Multiple blisters (11/2015).  PAST SURGICAL HISTORY: She  has a past surgical history that includes Cesarean section (09/09/2007); Breast lumpectomy (Left, 07/10/2014);  Portacath placement (Right, 09/06/2014); Mastectomy w/ sentinel node biopsy (Left, 09/06/2014); Port-a-cath removal (N/A, 12/08/2015); Chemo reservior insertion (N/A, 2/35/3614); and Application of cranial navigation (N/A, 02/01/2017).  Allergies  Allergen Reactions  . Zofran [Ondansetron Hcl] Itching    Current Facility-Administered Medications on File Prior to Encounter  Medication  . sodium chloride 0.9 % injection 10 mL   Current Outpatient Prescriptions on File Prior to Encounter  Medication Sig  . acetaminophen (TYLENOL) 500 MG tablet Take 1,000 mg by mouth every 6 (six) hours as needed for headache.  . dexamethasone (DECADRON) 2 MG tablet Take 1 tablet (2 mg total) by mouth 2 (two) times daily. (Patient taking differently: Take 2 mg by mouth daily. )  . diphenhydrAMINE (BENADRYL) 25 MG tablet Take 25-50 mg by mouth every 6 (six) hours as needed for itching or allergies.   Marland Kitchen LORazepam (ATIVAN) 0.5 MG tablet Take 1 tablet (0.5 mg total) by mouth every 8 (eight) hours. (Patient taking differently: Take 0.5 mg by mouth every 8 (eight) hours as needed for anxiety or sleep. )  . omeprazole (PRILOSEC) 20 MG capsule Take 1 capsule (20 mg total) by mouth 2 (two) times daily. (Patient taking differently: Take 20 mg by mouth daily. )  . oxyCODONE-acetaminophen (PERCOCET/ROXICET) 5-325 MG tablet Take 1 tablet by mouth every 4 (four) hours as needed for severe pain.  . tamoxifen (NOLVADEX) 20 MG tablet Take 1 tablet (20 mg total) by mouth daily.    FAMILY HISTORY:  Her indicated that her mother is alive. She indicated that her father is alive. She indicated that her sister is alive. She indicated that her brother is alive. She indicated that the status of her maternal grandmother is  unknown. She indicated that the status of her maternal grandfather is unknown.    SOCIAL HISTORY: She  reports that she has never smoked. She has never used smokeless tobacco. She reports that she does not drink  alcohol or use drugs.  REVIEW OF SYSTEMS:   Pertinent positives and negatives as per the HPI. ROS limited by language barrier.   SUBJECTIVE:    VITAL SIGNS: BP (!) 91/57   Pulse 90   Temp (!) 102.3 F (39.1 C) (Oral)   Resp (!) 21   SpO2 97%   HEMODYNAMICS:    VENTILATOR SETTINGS:    INTAKE / OUTPUT: No intake/output data recorded.  PHYSICAL EXAMINATION:  General Well nourished, well developed, thin, mild distress and uncomfortable.   HEENT No gross abnormalities. Oropharynx clear. Healing incision overlying R frontal reservoir with no erythema, exudate, tenderness or warmth.  Pulmonary Clear to auscultation bilaterally with no wheezes, rales or ronchi. Good effort, symmetrical expansion.   Cardiovascular Normal rate, irregular rhythm. S1, s2. No m/r/g. Distal pulses palpable.  Abdomen Soft, non-tender, non-distended, positive bowel sounds, no palpable organomegaly or masses. Normoresonant to percussion.  Musculoskeletal Post-surgical changes from mastectomy. Otherwise grossly normal.   Lymphatics No cervical, supraclavicular or axillary adenopathy.   Neurologic Grossly intact. No focal deficits.   Skin/Integuement No rash, no cyanosis, no clubbing. No edema.      LABS:  BMET  Recent Labs Lab 02/07/17 0930 02/12/17 2146  NA 139 133*  K 3.9 3.8  CL  --  106  CO2 26 19*  BUN 10.9 12  CREATININE 0.6 0.61  GLUCOSE 84 135*    Electrolytes  Recent Labs Lab 02/07/17 0930 02/12/17 2146  CALCIUM 9.1 8.4*    CBC  Recent Labs Lab 02/07/17 0930 02/12/17 2146  WBC 7.2 11.3*  HGB 11.5* 10.5*  HCT 35.3 33.0*  PLT 171 227    Coag's  Recent Labs Lab 02/12/17 2146  INR 1.10    Sepsis Markers  Recent Labs Lab 02/12/17 2150 02/12/17 2357  LATICACIDVEN 2.37* 3.03*    ABG No results for input(s): PHART, PCO2ART, PO2ART in the last 168 hours.  Liver Enzymes  Recent Labs Lab 02/07/17 0930 02/12/17 2146  AST 13 24  ALT 18 20  ALKPHOS 74  56  BILITOT 0.47 0.3  ALBUMIN 3.2* 3.6    Cardiac Enzymes No results for input(s): TROPONINI, PROBNP in the last 168 hours.  Glucose No results for input(s): GLUCAP in the last 168 hours.  Imaging Ct Head Wo Contrast  Result Date: 02/12/2017 CLINICAL DATA:  Acute onset of headache. Current history of brain cancer. Initial encounter. EXAM: CT HEAD WITHOUT CONTRAST TECHNIQUE: Contiguous axial images were obtained from the base of the skull through the vertex without intravenous contrast. COMPARISON:  CT of the head performed 02/01/2017, and MRI of the brain performed 01/03/2002 FINDINGS: Brain: The known cerebellar masses are not well characterized on provided images, with mild underlying edema again noted. Evaluation is somewhat suboptimal due to beam hardening artifact. There is no evidence of hemorrhagic transformation. No acute infarct is seen. A right-sided chemotherapy reservoir is grossly unremarkable in appearance, ending at the frontal horn of the right lateral ventricle. The third and lateral ventricles, and basal ganglia are unremarkable in appearance. The cerebral hemispheres are symmetric in appearance, with normal gray-white differentiation. No mass effect or midline shift is seen. Vascular: No hyperdense vessel or unexpected calcification. Skull: There is no evidence of fracture; visualized osseous structures are unremarkable in appearance.  Sinuses/Orbits: The orbits are within normal limits. The paranasal sinuses and mastoid air cells are well-aerated. Other: Skin staples are seen overlying the high right parietal calvarium. IMPRESSION: 1. Known cerebellar masses are not well characterized on provided images, with mild underlying edema again noted. 2. Right-sided chemotherapy reservoir is grossly unremarkable. Electronically Signed   By: Garald Balding M.D.   On: 02/12/2017 23:06   STUDIES:    CULTURES: Blood cx 3/31  ANTIBIOTICS: Vanc/cefepime/amp 3/31 >>  SIGNIFICANT  EVENTS:   LINES/TUBES: PIV  DISCUSSION: [name] is a 17F with PMH significant for metastatic breast cancer s/p recent Ommaya reservoir placement for intrathecal chemotherapy and first dose on 02/10/17. She presents with fever and meningitic signs concerning for meningitis. No other localizing signs or symptoms.   ASSESSMENT / PLAN: INFECTIOUS A:   Concern for meningitis P:   Continue broad spectrum meningitis coverage Await neurosurgical input Await oncology input  PULMONARY A: No acute issues P:     CARDIOVASCULAR A:  Borderline blood pressures - chronic Elevated lactate P:  MAP goal > 65 Trend lactate  Stress dose steroids   RENAL A:   No acute issues P:   Trend BMP Replace electrolytes as needed   GASTROINTESTINAL A:   No acute issues P:   Advance diet  HEMATOLOGIC A:   Metastatic breast cancer on active (palliative) treatment P:  Await oncology input  ENDOCRINE A:   No acute issues   P:   CBGs  NEUROLOGIC A:   Concern for meningitis P:   RASS goal: 0 Follow neuro exam Continue antibiotics   FAMILY  - Updates: family at bedside  - Inter-disciplinary family meet or Palliative Care meeting due by:  day 7  The patient is critically ill with multiple organ system failure and requires high complexity decision making for assessment and support, frequent evaluation and titration of therapies, advanced monitoring, review of radiographic studies and interpretation of complex data.   Critical Care Time devoted to patient care services, exclusive of separately billable procedures, described in this note is 45 minutes.   Yisroel Ramming, MD [facility]monary and [place] Pager: [phone]  02/13/2017, 12:22 AM

## 2017-02-14 ENCOUNTER — Encounter (HOSPITAL_COMMUNITY): Payer: Self-pay | Admitting: General Practice

## 2017-02-14 ENCOUNTER — Telehealth: Payer: Self-pay | Admitting: *Deleted

## 2017-02-14 DIAGNOSIS — G030 Nonpyogenic meningitis: Secondary | ICD-10-CM

## 2017-02-14 LAB — CBC WITH DIFFERENTIAL/PLATELET
BASOS ABS: 0 10*3/uL (ref 0.0–0.1)
Basophils Relative: 0 %
EOS ABS: 0 10*3/uL (ref 0.0–0.7)
Eosinophils Relative: 0 %
HCT: 30.6 % — ABNORMAL LOW (ref 36.0–46.0)
HEMOGLOBIN: 10.1 g/dL — AB (ref 12.0–15.0)
LYMPHS PCT: 5 %
Lymphs Abs: 0.7 10*3/uL (ref 0.7–4.0)
MCH: 21.5 pg — ABNORMAL LOW (ref 26.0–34.0)
MCHC: 33 g/dL (ref 30.0–36.0)
MCV: 65.1 fL — ABNORMAL LOW (ref 78.0–100.0)
Monocytes Absolute: 0.5 10*3/uL (ref 0.1–1.0)
Monocytes Relative: 4 %
NEUTROS PCT: 91 %
Neutro Abs: 11.8 10*3/uL — ABNORMAL HIGH (ref 1.7–7.7)
PLATELETS: 312 10*3/uL (ref 150–400)
RBC: 4.7 MIL/uL (ref 3.87–5.11)
RDW: 19.6 % — ABNORMAL HIGH (ref 11.5–15.5)
WBC: 13 10*3/uL — ABNORMAL HIGH (ref 4.0–10.5)

## 2017-02-14 LAB — BASIC METABOLIC PANEL
Anion gap: 11 (ref 5–15)
BUN: 18 mg/dL (ref 6–20)
CHLORIDE: 106 mmol/L (ref 101–111)
CO2: 22 mmol/L (ref 22–32)
CREATININE: 0.48 mg/dL (ref 0.44–1.00)
Calcium: 9.1 mg/dL (ref 8.9–10.3)
Glucose, Bld: 178 mg/dL — ABNORMAL HIGH (ref 65–99)
POTASSIUM: 3.6 mmol/L (ref 3.5–5.1)
SODIUM: 139 mmol/L (ref 135–145)

## 2017-02-14 LAB — URINE CULTURE

## 2017-02-14 LAB — PATHOLOGIST SMEAR REVIEW

## 2017-02-14 LAB — GLUCOSE, CAPILLARY
GLUCOSE-CAPILLARY: 58 mg/dL — AB (ref 65–99)
GLUCOSE-CAPILLARY: 157 mg/dL — AB (ref 65–99)
Glucose-Capillary: 146 mg/dL — ABNORMAL HIGH (ref 65–99)
Glucose-Capillary: 173 mg/dL — ABNORMAL HIGH (ref 65–99)

## 2017-02-14 LAB — SURGICAL PCR SCREEN
MRSA, PCR: NEGATIVE
Staphylococcus aureus: NEGATIVE

## 2017-02-14 MED ORDER — DEXAMETHASONE 2 MG PO TABS: 2.0000 mg | ORAL_TABLET | Freq: Every day | ORAL | Status: AC

## 2017-02-14 MED ORDER — OXYCODONE-ACETAMINOPHEN 5-325 MG PO TABS: 1.0000 | ORAL_TABLET | ORAL | 0 refills | Status: DC | PRN

## 2017-02-14 NOTE — Telephone Encounter (Signed)
This RN was contacted by Almyra Free - spanish interpreter/pt liaison per her interaction with pt over the weekend.  Pt developed mid back pain and then headaches with fever on 02/12/2017.  She proceeded to the ER and is currently admitted.  Symptoms are resolving presently.  Per above discussion Almyra Free stated pt may need to be seen prior to scheduled appointment on 4/13 with Dr Lindi Adie.  Per review with Dr Jana Hakim  (covering partner for MD) pt can be seen by him or midlevel this week if needed.  Almyra Free will follow up with pt and covering MD with above information.

## 2017-02-14 NOTE — Progress Notes (Signed)
Pt seen and examined. No issues overnight.  EXAM: Temp:  [98.3 F (36.8 C)-98.7 F (37.1 C)] 98.4 F (36.9 C) (04/02 0529) Pulse Rate:  [62-86] 62 (04/02 0539) Resp:  [16-18] 18 (04/02 0529) BP: (89-94)/(45-66) 93/63 (04/02 0539) SpO2:  [99 %-100 %] 100 % (04/02 0529) Weight:  [53.2 kg (117 lb 4.6 oz)] 53.2 kg (117 lb 4.6 oz) (04/02 0500) Intake/Output      04/01 0701 - 04/02 0700 04/02 0701 - 04/03 0700   P.O. 440    I.V. (mL/kg) 70 (1.3)    IV Piggyback 1050    Total Intake(mL/kg) 1560 (29.3)    Urine (mL/kg/hr) 1100 (0.9)    Stool 0 (0)    Total Output 1100     Net +460          Urine Occurrence 3 x    Stool Occurrence 0 x     Awake and alert Follows commands throughout Full strength Incision c/d/I  CSF studies unremarkable for infection; WBC/RBC ratio is lower than it was several days ago.  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  No indication for reservoir removal at this time.  May d/c when primary service wishes.

## 2017-02-14 NOTE — Discharge Instructions (Addendum)
You have been diagnosed with having an inflammatory reaction to chemotherapy, or chemical meningitis. We did not find any bacteria in your brain or shunt.   Follow up with Oncology Dr. Lindi Adie or their partner this week.   Please obtain have blood drawn for a CBC in 1 week to ensure resolution of mild leukocytosis of 13.0. Please follow up on the following pending results: CSF culture, blood cultures.

## 2017-02-14 NOTE — Discharge Summary (Signed)
Physician Discharge Summary  Teairra Millar Rodriguez-Fuentes YSA:[id number] DOB: 06-Mar-1986 DOA: 02/12/2017  PCP: Rulon Eisenmenger, MD  Admit date: 02/12/2017 Discharge date: 02/14/2017  Admitted From: home Disposition:  home  Recommendations for Outpatient Follow-up:  1. Follow up with Oncology Dr. Lindi Adie or partner this week 2. Please obtain CBC in 1 week to ensure resolution of mild leukocytosis of 13.0 3. Please follow up on the following pending results: CSF culture, blood cultures   Home Health: No  Equipment/Devices: None   Discharge Condition: Stable CODE STATUS: Full  Diet recommendation: General   Brief/Interim Summary: Arlys E Rodriguez-Fuentes is a 38F with PMH significant for metastatic breast cancer with leptomeningeal carcinomatosis s/p chemotherapy, radiation, and recent placement of an Ommaya reservoir for intrathecal chemotherapy. She presented to the ED from home with complaints of headache, fever, LLQ abdominal pain. Headache began after CSF was obtained for cytology. Neurosurgery was consulted who recommends antibiotics, steroids, and will see in consultation. She was empirically treated with broad spectrum antibiotics to cover meningitis. Neurosurgery evaluated patient and underwent aspiration of omaya reservoir with cell studies. CSF study was unremarkable for infection, culture is pending at time of discharge. Neurosurgery suspects this is a chemical meningitis or inflammatory response to chemo.   Subjective on day of discharge: Feeling okay today. Family at bedside is able to translate during exam. She denies headaches, no further fevers, chills, no chest pain, SOB, nausea, vomiting.   Discharge Diagnoses:  Active Problems:   Sepsis ([place])   Bacterial meningitis   Lactic acidosis  Headache and fever, concern for meningitis post Ommaya reservoir placement -CSF studies unremarkable for bacterial meningitis -Could be secondary to chemical meningitis, inflammatory  response -Appreciate neurosurgery -No further complaints of headache -Stop antibiotics  Metastatic breast cancer -Need to follow up with oncology   Chronic low blood pressure -Stable currently   Discharge Instructions  Discharge Instructions    Call MD for:  persistant dizziness or light-headedness    Complete by:  As directed    Call MD for:  redness, tenderness, or signs of infection (pain, swelling, redness, odor or green/yellow discharge around incision site)    Complete by:  As directed    Call MD for:  severe uncontrolled pain    Complete by:  As directed    Call MD for:  temperature >100.4    Complete by:  As directed    Increase activity slowly    Complete by:  As directed      Allergies as of 02/14/2017      Reactions   Zofran [ondansetron Hcl] Itching      Medication List    TAKE these medications   acetaminophen 500 MG tablet Commonly known as:  TYLENOL Take 1,000 mg by mouth every 6 (six) hours as needed for headache.   dexamethasone 2 MG tablet Commonly known as:  DECADRON Take 1 tablet (2 mg total) by mouth daily.   diphenhydrAMINE 25 MG tablet Commonly known as:  BENADRYL Take 25-50 mg by mouth every 6 (six) hours as needed for itching or allergies.   LORazepam 0.5 MG tablet Commonly known as:  ATIVAN Take 1 tablet (0.5 mg total) by mouth every 8 (eight) hours. What changed:  when to take this  reasons to take this   omeprazole 20 MG capsule Commonly known as:  PRILOSEC Take 1 capsule (20 mg total) by mouth 2 (two) times daily. What changed:  when to take this   oxyCODONE-acetaminophen 5-325 MG tablet Commonly known  as:  PERCOCET/ROXICET Take 1 tablet by mouth every 4 (four) hours as needed for severe pain.   tamoxifen 20 MG tablet Commonly known as:  NOLVADEX Take 1 tablet (20 mg total) by mouth daily.      Follow-up Information    Rulon Eisenmenger, MD. Call in 1 day(s).   Specialty:  Hematology and Oncology Contact  information: [place] [phone]          Allergies  Allergen Reactions  . Zofran [Ondansetron Hcl] Itching    Consultations:  PCCM  Neurosurgery    Procedures/Studies: Ct Head Wo Contrast  Result Date: 02/12/2017 CLINICAL DATA:  Acute onset of headache. Current history of brain cancer. Initial encounter. EXAM: CT HEAD WITHOUT CONTRAST TECHNIQUE: Contiguous axial images were obtained from the base of the skull through the vertex without intravenous contrast. COMPARISON:  CT of the head performed 02/01/2017, and MRI of the brain performed 01/03/2002 FINDINGS: Brain: The known cerebellar masses are not well characterized on provided images, with mild underlying edema again noted. Evaluation is somewhat suboptimal due to beam hardening artifact. There is no evidence of hemorrhagic transformation. No acute infarct is seen. A right-sided chemotherapy reservoir is grossly unremarkable in appearance, ending at the frontal horn of the right lateral ventricle. The third and lateral ventricles, and basal ganglia are unremarkable in appearance. The cerebral hemispheres are symmetric in appearance, with normal gray-white differentiation. No mass effect or midline shift is seen. Vascular: No hyperdense vessel or unexpected calcification. Skull: There is no evidence of fracture; visualized osseous structures are unremarkable in appearance. Sinuses/Orbits: The orbits are within normal limits. The paranasal sinuses and mastoid air cells are well-aerated. Other: Skin staples are seen overlying the high right parietal calvarium. IMPRESSION: 1. Known cerebellar masses are not well characterized on provided images, with mild underlying edema again noted. 2. Right-sided chemotherapy reservoir is grossly unremarkable. Electronically Signed   By: Garald Balding M.D.   On: 02/12/2017 23:06   Ct Head Wo Contrast  Result Date: 02/02/2017 CLINICAL DATA:  31 year old  female status post recent brain surgery. EXAM: CT HEAD WITHOUT CONTRAST TECHNIQUE: Contiguous axial images were obtained from the base of the skull through the vertex without intravenous contrast. COMPARISON:  Head CT dated 01/25/2017 FINDINGS: Brain: There has been interval placement of a right frontal ventriculostomy shunt with tip in the frontal horn of the right lateral ventricle. Small amount of air in the anterior aspect of the frontal horn of the right lateral ventricle is noted. The ventricle size remains similar to prior CT. There is no acute intracranial hemorrhage. Small right frontal pneumocephalus, iatrogenic. There is similar appearance of the right cerebellar edema associated with mass. The previously seen mass in the superior aspect of the left cerebellar hemisphere is very poorly visualized. There is no midline shift. Vascular: No hyperdense vessel or unexpected calcification. Skull: Right frontal burr hole.  No acute calvarial fractures. Sinuses/Orbits: No acute finding. Other: Postsurgical changes of the right frontotemporal or scalp with skin staples. IMPRESSION: 1. No acute intracranial hemorrhage. 2. Interval placement of a right frontal ventriculostomy shunt. No significant interval change in the size of the ventricular system compared to the prior CT. Small intraventricular air as well as a small right frontal pneumocephalus noted, iatrogenic. 3. No change in the amount of edema associated with right cerebellar mass. The previously seen lesion along the superior aspect of the left cerebellar hemisphere is poorly visualized. Electronically Signed   By: Milas Hock  Radparvar M.D.   On: 02/02/2017 03:58   Ct Head Wo Contrast  Result Date: 01/25/2017 CLINICAL DATA:  Brain metastases. BrainLAB protocol for surgical planning. EXAM: CT HEAD WITHOUT CONTRAST TECHNIQUE: Contiguous axial images were obtained from the base of the skull through the vertex without intravenous contrast. COMPARISON:  Brain  MRI and CT 01/03/2017 FINDINGS: Brain: Inferior right cerebellar mass measures 3.1 x 1.7 cm (previously 3.1 x 1.9 cm) with mild-to-moderate surrounding vasogenic edema which has mildly decreased. There is persistent partial effacement of the fourth ventricle. Smaller mass in the superior left cerebellum is again noted with very mild edema. The lateral and third ventricles are stable to minimally larger than on the prior head CT. No supratentorial mass effect is identified. There is no evidence of acute cortical infarct, acute intracranial hemorrhage, or extra-axial fluid collection. Vascular: No hyperdense vessel or unexpected calcification. Skull: No fracture or focal osseous lesion. Sinuses/Orbits: Visualized paranasal sinuses and mastoid air cells are clear. Orbits are unremarkable. Other: None. IMPRESSION: 1. BrainLAB CT for surgical planning. 2. Stable to slightly decreased size of right cerebellar mass with mildly decreased vasogenic edema. 3. Very mild persistent edema associated with the superior left cerebellar mass. 4. Stable to minimally increased size of the lateral and third ventricles. Electronically Signed   By: Logan Bores M.D.   On: 01/25/2017 16:33       Discharge Exam: Vitals:   02/14/17 0529 02/14/17 0539  BP: (!) 89/45 93/63  Pulse: 72 62  Resp: 18   Temp: 98.4 F (36.9 C)    Vitals:   02/13/17 2310 02/14/17 0500 02/14/17 0529 02/14/17 0539  BP: 94/64  (!) 89/45 93/63  Pulse: 86  72 62  Resp: 18  18   Temp: 98.3 F (36.8 C)  98.4 F (36.9 C)   TempSrc: Oral  Oral   SpO2: 99%  100%   Weight:  53.2 kg (117 lb 4.6 oz)      General: Pt is alert, awake, not in acute distress, staples in tact and site is clean and dry on scalp  Cardiovascular: RRR, S1/S2 +, no rubs, no gallops Respiratory: CTA bilaterally, no wheezing, no rhonchi Abdominal: Soft, NT, ND, bowel sounds + Extremities: no edema, no cyanosis Neuro: non focal, speech fluent     The results of significant  diagnostics from this hospitalization (including imaging, microbiology, ancillary and laboratory) are listed below for reference.     Microbiology: Recent Results (from the past 240 hour(s))  TECHNOLOGIST REVIEW     Status: None   Collection Time: 02/07/17  9:30 AM  Result Value Ref Range Status   Technologist Review Few helmets, fragments, teardrops, mod ovalos  Final  Culture, blood (Routine x 2)     Status: None (Preliminary result)   Collection Time: 02/12/17  9:35 PM  Result Value Ref Range Status   Specimen Description BLOOD RIGHT ARM  Final   Special Requests BOTTLES DRAWN AEROBIC AND ANAEROBIC 5CC  Final   Culture NO GROWTH 2 DAYS  Final   Report Status PENDING  Incomplete  Culture, blood (Routine x 2)     Status: None (Preliminary result)   Collection Time: 02/12/17  9:47 PM  Result Value Ref Range Status   Specimen Description BLOOD RIGHT HAND  Final   Special Requests IN PEDIATRIC BOTTLE 4CC  Final   Culture NO GROWTH 2 DAYS  Final   Report Status PENDING  Incomplete  Urine culture     Status: Abnormal  Collection Time: 02/12/17 10:40 PM  Result Value Ref Range Status   Specimen Description URINE, CLEAN CATCH  Final   Special Requests NONE  Final   Culture MULTIPLE SPECIES PRESENT, SUGGEST RECOLLECTION (A)  Final   Report Status 02/14/2017 FINAL  Final  MRSA PCR Screening     Status: None   Collection Time: 02/13/17  3:32 AM  Result Value Ref Range Status   MRSA by PCR NEGATIVE NEGATIVE Final    Comment:        The GeneXpert MRSA Assay (FDA approved for NASAL specimens only), is one component of a comprehensive MRSA colonization surveillance program. It is not intended to diagnose MRSA infection nor to guide or monitor treatment for MRSA infections.   CSF culture with Stat gram stain     Status: None (Preliminary result)   Collection Time: 02/13/17  3:16 PM  Result Value Ref Range Status   Specimen Description CSF  Final   Special Requests NONE  Final    Gram Stain   Final    WBC PRESENT,BOTH PMN AND MONONUCLEAR NO ORGANISMS SEEN CYTOSPIN SMEAR    Culture NO GROWTH < 24 HOURS  Final   Report Status PENDING  Incomplete     Labs: BNP (last 3 results) No results for input(s): BNP in the last 8760 hours. Basic Metabolic Panel:  Recent Labs Lab 02/12/17 2146 02/13/17 0224 02/14/17 1303  NA 133* 143 139  K 3.8 3.6 3.6  CL 106 113* 106  CO2 19* 22 22  GLUCOSE 135* 148* 178*  BUN 12 7 18   CREATININE 0.61 0.51 0.48  CALCIUM 8.4* 8.1* 9.1  MG  --  1.7  --   PHOS  --  2.9  --    Liver Function Tests:  Recent Labs Lab 02/12/17 2146  AST 24  ALT 20  ALKPHOS 56  BILITOT 0.3  PROT 6.0*  ALBUMIN 3.6   No results for input(s): LIPASE, AMYLASE in the last 168 hours. No results for input(s): AMMONIA in the last 168 hours. CBC:  Recent Labs Lab 02/12/17 2146 02/13/17 0224 02/14/17 1303  WBC 11.3* 9.9 13.0*  NEUTROABS 10.3*  --  11.8*  HGB 10.5* 9.8* 10.1*  HCT 33.0* 29.8* 30.6*  MCV 65.9* 65.4* 65.1*  PLT 227 257 312   Cardiac Enzymes: No results for input(s): CKTOTAL, CKMB, CKMBINDEX, TROPONINI in the last 168 hours. BNP: Invalid input(s): POCBNP CBG:  Recent Labs Lab 02/13/17 2345 02/14/17 0419 02/14/17 0822 02/14/17 0849 02/14/17 1253  GLUCAP 178* 146* 58* 173* 157*   D-Dimer No results for input(s): DDIMER in the last 72 hours. Hgb A1c No results for input(s): HGBA1C in the last 72 hours. Lipid Profile No results for input(s): CHOL, HDL, LDLCALC, TRIG, CHOLHDL, LDLDIRECT in the last 72 hours. Thyroid function studies No results for input(s): TSH, T4TOTAL, T3FREE, THYROIDAB in the last 72 hours.  Invalid input(s): FREET3 Anemia work up No results for input(s): VITAMINB12, FOLATE, FERRITIN, TIBC, IRON, RETICCTPCT in the last 72 hours. Urinalysis    Component Value Date/Time   COLORURINE STRAW (A) 02/12/2017 2240   APPEARANCEUR CLEAR 02/12/2017 2240   LABSPEC 1.010 02/12/2017 2240   PHURINE 7.0  02/12/2017 2240   GLUCOSEU 50 (A) 02/12/2017 2240   HGBUR NEGATIVE 02/12/2017 2240   BILIRUBINUR NEGATIVE 02/12/2017 2240   KETONESUR NEGATIVE 02/12/2017 2240   PROTEINUR NEGATIVE 02/12/2017 2240   UROBILINOGEN 0.2 10/28/2014 1609   NITRITE NEGATIVE 02/12/2017 2240   LEUKOCYTESUR MODERATE (A) 02/12/2017 2240  Sepsis Labs Invalid input(s): PROCALCITONIN,  WBC,  LACTICIDVEN Microbiology Recent Results (from the past 240 hour(s))  TECHNOLOGIST REVIEW     Status: None   Collection Time: 02/07/17  9:30 AM  Result Value Ref Range Status   Technologist Review Few helmets, fragments, teardrops, mod ovalos  Final  Culture, blood (Routine x 2)     Status: None (Preliminary result)   Collection Time: 02/12/17  9:35 PM  Result Value Ref Range Status   Specimen Description BLOOD RIGHT ARM  Final   Special Requests BOTTLES DRAWN AEROBIC AND ANAEROBIC 5CC  Final   Culture NO GROWTH 2 DAYS  Final   Report Status PENDING  Incomplete  Culture, blood (Routine x 2)     Status: None (Preliminary result)   Collection Time: 02/12/17  9:47 PM  Result Value Ref Range Status   Specimen Description BLOOD RIGHT HAND  Final   Special Requests IN PEDIATRIC BOTTLE 4CC  Final   Culture NO GROWTH 2 DAYS  Final   Report Status PENDING  Incomplete  Urine culture     Status: Abnormal   Collection Time: 02/12/17 10:40 PM  Result Value Ref Range Status   Specimen Description URINE, CLEAN CATCH  Final   Special Requests NONE  Final   Culture MULTIPLE SPECIES PRESENT, SUGGEST RECOLLECTION (A)  Final   Report Status 02/14/2017 FINAL  Final  MRSA PCR Screening     Status: None   Collection Time: 02/13/17  3:32 AM  Result Value Ref Range Status   MRSA by PCR NEGATIVE NEGATIVE Final    Comment:        The GeneXpert MRSA Assay (FDA approved for NASAL specimens only), is one component of a comprehensive MRSA colonization surveillance program. It is not intended to diagnose MRSA infection nor to guide  or monitor treatment for MRSA infections.   CSF culture with Stat gram stain     Status: None (Preliminary result)   Collection Time: 02/13/17  3:16 PM  Result Value Ref Range Status   Specimen Description CSF  Final   Special Requests NONE  Final   Gram Stain   Final    WBC PRESENT,BOTH PMN AND MONONUCLEAR NO ORGANISMS SEEN CYTOSPIN SMEAR    Culture NO GROWTH < 24 HOURS  Final   Report Status PENDING  Incomplete     Time coordinating discharge: 50 minutes  SIGNED:  Dessa Phi, DO [facility] Pager [phone]  If 7PM-7AM, please contact night-coverage [email or web address] Password [email or web address] 02/14/2017, 4:16 PM

## 2017-02-14 NOTE — Progress Notes (Signed)
Discharge instructions printed in [place] and reviewed with patient and sister who assisted with translation, and copy given for them to take home. All questions addressed at this time. New prescriptions given. IV removed. Room searched for patient belongings, and confirmed with patient that all valuables were accounted for. Family assisted patient to dress. Awaiting patient's husband who will provide transportation home. Will continue to monitor until time of discharge.

## 2017-02-15 ENCOUNTER — Other Ambulatory Visit (HOSPITAL_COMMUNITY): Payer: Self-pay | Admitting: General Surgery

## 2017-02-15 ENCOUNTER — Telehealth: Payer: Self-pay

## 2017-02-15 ENCOUNTER — Ambulatory Visit (HOSPITAL_BASED_OUTPATIENT_CLINIC_OR_DEPARTMENT_OTHER): Payer: Self-pay | Admitting: Oncology

## 2017-02-15 VITALS — BP 90/52 | HR 84 | Temp 97.5°F | Resp 18 | Wt 117.6 lb

## 2017-02-15 DIAGNOSIS — C50912 Malignant neoplasm of unspecified site of left female breast: Secondary | ICD-10-CM

## 2017-02-15 DIAGNOSIS — C7949 Secondary malignant neoplasm of other parts of nervous system: Secondary | ICD-10-CM

## 2017-02-15 DIAGNOSIS — Z170 Estrogen receptor positive status [ER+]: Secondary | ICD-10-CM

## 2017-02-15 DIAGNOSIS — C50512 Malignant neoplasm of lower-outer quadrant of left female breast: Secondary | ICD-10-CM

## 2017-02-15 NOTE — Progress Notes (Addendum)
Patient Care Team: Nicholas Lose, MD as PCP - General (Hematology and Oncology)  DIAGNOSIS:  No diagnosis found.  SUMMARY OF ONCOLOGIC HISTORY:   Breast cancer of lower-outer quadrant of left female breast ([place])   06/06/2014 Imaging    Palpable Left breast mass at 12:00 position 1.9 cm, initial biopsy 06/12/2014 revealed fibrocystic changes.      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 The genes tested were ATM, BARD1, BRCA1, BRCA2, BRIP1, CDH1, CHEK2, MRE11A, MUTYH, NBN, NF1, PALB2, PTEN, RAD50, RAD51C, RAD51D, and TP53.      10/04/2014 - 01/20/2015 Chemotherapy    Patient was started on adjuvant chemotherapy with Taxotere, Carboplatin, Herceptin, Perjeta followed by Herceptin maintenance for 1 year.        02/11/2015 - 03/25/2015 Radiation Therapy    Adjuvant radiation therapy      05/01/2015 -  Anti-estrogen oral therapy    tamoxifen 20 mg daily      01/03/2017 Imaging    Brain MRI: Intra-axial mass right inf cerebellum 33 mm, mass left sup cerebellum 25 mm, leptomeningeal carcinomatosis of cerebellum, left trigeminal nerve involvement, mass effect related to the right inferior cerebellar metastases slight right-to-left shift, 6 mm metastases and foramina of Magendie, punctate focus right frontal subcortical white matter      01/05/2017 - 01/19/2017 Radiation Therapy    Whole brain radiation therapy      CHIEF COMPLIANT: Metastatic breast cancerWith  leptomeningeal spread  INTERVAL HISTORY:  [name] returns today accompanied by her husband and a translator after being discharged from a brief admission to [facility]. The patient had her first dose of intrathecal cytarabine given 02/11/2012. Over the next 48 hours she developed a severe headache and pain down her back, which took her to the emergency room and required admission. She was treated with Solu-Medrol and analgesics and recovered quickly. Evaluation by neurosurgery included tapping the Ommaya reservoir for fluid, which was not felt to be suggestive of infection (normal CSF glucose and protein). She was discharged yesterday and called today with many questions and concerns. We were able to work her in to try to address these.  REVIEW OF SYSTEMS:   Currently the patient has no headache, denies visual changes, denies nausea or vomiting, and denies dizziness or gait imbalance. She denies thrush, sore throat, or difficulty swallowing. She has had no problems or changes in bowel or bladder habits and specifically is aware of when she needs to urinate and has full bladder control. She is having regular bowel movements. She denies fever, bleeding, or rash. A detailed review of systems today was otherwise stable  SOCIAL HISTORY: The patient is originally from [place]. She is disabled secondary to upper extremity weakness secondary to her prior breast cancer treatments. Her husband Elita Quick is an Clinical biochemist. They have a daughter age 84 and a son age for at home. The daughter is currently in school. In addition the patient has 2 sisters and one brother in  this area.     ALLERGIES:  is allergic to zofran [ondansetron hcl].  MEDICATIONS:  Current Outpatient Prescriptions  Medication Sig Dispense Refill  . acetaminophen (TYLENOL) 500 MG tablet Take 1,000 mg by mouth every 6 (six) hours as needed for headache.    . dexamethasone (DECADRON) 2 MG tablet Take 1 tablet (2 mg  total) by mouth daily.    . diphenhydrAMINE (BENADRYL) 25 MG tablet Take 25-50 mg by mouth every 6 (six) hours as needed for itching or allergies.     Marland Kitchen LORazepam (ATIVAN) 0.5 MG tablet Take 1 tablet (0.5 mg total) by mouth every 8 (eight) hours. (Patient taking differently: Take 0.5 mg by mouth every 8 (eight) hours as needed for anxiety or sleep. ) 30 tablet 0  . omeprazole (PRILOSEC) 20 MG capsule Take 1 capsule (20 mg total) by mouth 2 (two) times daily. (Patient taking differently: Take 20 mg by mouth daily. ) 60 capsule 0  . oxyCODONE-acetaminophen (PERCOCET/ROXICET) 5-325 MG tablet Take 1 tablet by mouth every 4 (four) hours as needed for severe pain. 10 tablet 0  . tamoxifen (NOLVADEX) 20 MG tablet Take 1 tablet (20 mg total) by mouth daily. 90 tablet 3   No current facility-administered medications for this visit.    Facility-Administered Medications Ordered in Other Visits  Medication Dose Route Frequency Provider Last Rate Last Dose  . sodium chloride 0.9 % injection 10 mL  10 mL Intravenous PRN Nicholas Lose, MD   10 mL at 12/09/14 1632    PHYSICAL EXAMINATION: ECOG PERFORMANCE STATUS: 2 - Symptomatic, <50% confined to bed  Vitals:   02/15/17 1302  BP: (!) 90/52  Pulse: 84  Resp: 18  Temp: 97.5 F (36.4 C)   Filed Weights   02/15/17 1302  Weight: 117 lb 9.6 oz (53.3 kg)   Moon facies Sclerae unicteric, Pupils round and reactive, EOMs intact Oropharynx clear, no thrush or other lesions No cervical or supraclavicular adenopathy Lungs no rales or rhonchi Heart regular rate and rhythm Abd soft, nontender, positive bowel sounds MSK no focal spinal tenderness, stands up from sitting position with difficulty unless pushing Neuro: Motor is 5 minus throughout, she is well oriented, affect is appropriate Breasts: Deferred  LABORATORY DATA:  I have reviewed the data as listed   Chemistry      Component Value Date/Time   NA 139 02/14/2017 1303   NA 139 02/07/2017 0930    K 3.6 02/14/2017 1303   K 3.9 02/07/2017 0930   CL 106 02/14/2017 1303   CO2 22 02/14/2017 1303   CO2 26 02/07/2017 0930   BUN 18 02/14/2017 1303   BUN 10.9 02/07/2017 0930   CREATININE 0.48 02/14/2017 1303   CREATININE 0.6 02/07/2017 0930      Component Value Date/Time   CALCIUM 9.1 02/14/2017 1303   CALCIUM 9.1 02/07/2017 0930   ALKPHOS 56 02/12/2017 2146   ALKPHOS 74 02/07/2017 0930   AST 24 02/12/2017 2146   AST 13 02/07/2017 0930   ALT 20 02/12/2017 2146   ALT 18 02/07/2017 0930   BILITOT 0.3 02/12/2017 2146   BILITOT 0.47 02/07/2017 0930       Lab Results  Component Value Date   WBC 13.0 (H) 02/14/2017   HGB 10.1 (L) 02/14/2017   HCT 30.6 (L) 02/14/2017   MCV 65.1 (L) 02/14/2017   PLT 312 02/14/2017   NEUTROABS 11.8 (H) 02/14/2017    ASSESSMENT 31 year old Spanish-speaker with stage IV, 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 She understands that she does not have brain cancer but breast cancer in the brain and specifically that the cancer cells are floating around in the bag that encases the brain and spinal cord. For that reason local treatments like radiation would not be effective. She needs a treatment that will reach the cancer cells are floating in the entire CSF fluid environment.  We discussed the fact that chemotherapy does not cross the blood-brain barrier except for a few agents which are not generally used in breast cancer. For that reason to get chemotherapy into the spinal fluid we need to to breach the membranes that constituted the spinal fluid bag-- that is why she has an Ommaya reservoir in place.  We also discussed the fact, which she was already aware of, that we do not know how to cure stage IV breast cancer and that the goal of treatment is control area ideally we would treat with drugs that have no side effects but that is not  available, since every medication will have side effects and in her situation in particular she had a very strong reaction to the DepoCyt given intrathecal and she is also having significant side effects from the steroids, and particular movement bases and quads weakness.  If or when the cancer progresses she is at risk for seizures, paralysis, inability to use her bladder, or to evacuate her stool, confusion, and other major complications. When that occurs further treatment likely would be futile and we can switch to comfort care under cover of hospice.  She does not qualify for hospice now because she desires active treatment but she does qualify for in home palliative care and we can place that referral for her. They are already aware of [facility]--this was suggested by the school counselor--but they have been too busy to get the kids over there. I urged her to go ahead and do it since that will be very helpful to the children and indirectly to them as well.  They would like to know what Dr. Sonny Dandy can do so the whole horrible headache and spinal cord pain she had after the first treatment does not recur. Basically he can consider lowering the dose, more likely he can add more steroids both intrathecally and peripherally, or he can omit the depoCyt.  My recommendation was that she come to the appointment 02/25/2017 expecting to be treated that day, with some modifications to deal with the possible reaction. However if she does have a severe reaction a second time most likely this treatment will not work for her and it will have to be discontinued.  They know to call for any problems that may develop before her next visit here.     Chauncey Cruel, MD 02/15/17  ADDENDUM: I requested cytology from the 02/13/2017 sample obtained at [facility]

## 2017-02-15 NOTE — Telephone Encounter (Signed)
Called Almyra Free (interpreter) to let pt know that Dr.Magrinat will see patient today at 1pm to follow up post discharge from hospital. Pt will be here today per Almyra Free.

## 2017-02-16 ENCOUNTER — Other Ambulatory Visit: Payer: Self-pay | Admitting: Emergency Medicine

## 2017-02-16 ENCOUNTER — Other Ambulatory Visit: Payer: Self-pay

## 2017-02-16 ENCOUNTER — Other Ambulatory Visit (HOSPITAL_COMMUNITY)
Admission: RE | Admit: 2017-02-16 | Discharge: 2017-02-16 | Disposition: A | Discharge status: Home / Self Care | Payer: Self-pay | Source: Ambulatory Visit | Attending: Neurological Surgery | Admitting: Neurological Surgery

## 2017-02-16 DIAGNOSIS — Z170 Estrogen receptor positive status [ER+]: Principal | ICD-10-CM

## 2017-02-16 DIAGNOSIS — C50512 Malignant neoplasm of lower-outer quadrant of left female breast: Secondary | ICD-10-CM

## 2017-02-16 LAB — CSF CULTURE

## 2017-02-16 LAB — CSF CULTURE W GRAM STAIN: Culture: NO GROWTH

## 2017-02-17 ENCOUNTER — Other Ambulatory Visit: Payer: Self-pay | Admitting: General Surgery

## 2017-02-17 LAB — CULTURE, BLOOD (ROUTINE X 2)
CULTURE: NO GROWTH
Culture: NO GROWTH

## 2017-02-18 ENCOUNTER — Encounter (HOSPITAL_COMMUNITY): Payer: Self-pay

## 2017-02-18 ENCOUNTER — Ambulatory Visit (HOSPITAL_COMMUNITY)
Admission: RE | Admit: 2017-02-18 | Discharge: 2017-02-18 | Disposition: A | Discharge status: Home / Self Care | Payer: Self-pay | Source: Ambulatory Visit | Attending: General Surgery | Admitting: General Surgery

## 2017-02-18 ENCOUNTER — Inpatient Hospital Stay (HOSPITAL_COMMUNITY): Admission: RE | Admit: 2017-02-18 | Payer: Self-pay | Source: Ambulatory Visit

## 2017-02-18 DIAGNOSIS — C50912 Malignant neoplasm of unspecified site of left female breast: Secondary | ICD-10-CM

## 2017-02-18 MED ORDER — CEFAZOLIN SODIUM-DEXTROSE 2-4 GM/100ML-% IV SOLN: 2.0000 g | INTRAVENOUS | Status: DC

## 2017-02-18 MED ORDER — SODIUM CHLORIDE 0.9 % IV SOLN: INTRAVENOUS | Status: DC

## 2017-02-18 NOTE — Progress Notes (Signed)
Patient presented today for port a cath placement however, she was unaware why she was here.  We had a long discussion through the interpretor explaining that she is scheduled to receive systemic chemotherapy for her malignancy as well as intrathecal chemotherapy and for the systemic chemo a PAC has been requested.  She states that her most recent visit with oncology discussed the intrathecal treatments but not the need for a PAC.  She hints today at the fact that she no longer wants to have the intrathecal treatment.  Ultimately, despite explaining that I had contacted the cancer center and they informed me this is what Dr. Lindi Adie and Dr. Jana Hakim would like for her future treatments, she states that she was not aware of this and will not proceed until she can speak to her oncologist regarding this plan.  Her procedure today will be cancelled.  I have informed her that once she speaks to her oncologist and a plan is determined that a new order can be placed for her PAC placement if that is how she chooses to proceed.  She is agreeable with this plan.  [name] E 11:33 AM 02/18/2017

## 2017-02-21 ENCOUNTER — Encounter: Payer: Self-pay | Admitting: Hematology and Oncology

## 2017-02-21 ENCOUNTER — Encounter: Payer: Self-pay | Admitting: General Practice

## 2017-02-21 ENCOUNTER — Telehealth: Payer: Self-pay | Admitting: Hematology and Oncology

## 2017-02-21 ENCOUNTER — Ambulatory Visit (HOSPITAL_BASED_OUTPATIENT_CLINIC_OR_DEPARTMENT_OTHER): Payer: Self-pay | Admitting: Hematology and Oncology

## 2017-02-21 DIAGNOSIS — C50512 Malignant neoplasm of lower-outer quadrant of left female breast: Secondary | ICD-10-CM

## 2017-02-21 DIAGNOSIS — R52 Pain, unspecified: Secondary | ICD-10-CM

## 2017-02-21 DIAGNOSIS — Z170 Estrogen receptor positive status [ER+]: Secondary | ICD-10-CM

## 2017-02-21 DIAGNOSIS — C7949 Secondary malignant neoplasm of other parts of nervous system: Secondary | ICD-10-CM

## 2017-02-21 MED ORDER — OXYCODONE-ACETAMINOPHEN 5-325 MG PO TABS: 1.0000 | ORAL_TABLET | ORAL | 0 refills | Status: DC | PRN

## 2017-02-21 NOTE — Progress Notes (Signed)
Patient Care Team: Nicholas Lose, MD as PCP - General (Hematology and Oncology)  DIAGNOSIS:  Encounter Diagnosis  Name Primary?  . Malignant neoplasm of lower-outer quadrant of left breast of female, estrogen receptor positive ([place])     SUMMARY OF ONCOLOGIC HISTORY:   Malignant neoplasm of lower-outer quadrant of left breast of female, estrogen receptor positive ([place])   06/06/2014 Imaging    Palpable Left breast mass at 12:00 position 1.9 cm, initial biopsy 06/12/2014 revealed fibrocystic changes.      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 The genes tested were ATM, BARD1, BRCA1, BRCA2, BRIP1, CDH1, CHEK2, MRE11A, MUTYH, NBN, NF1, PALB2, PTEN, RAD50, RAD51C, RAD51D, and TP53.      10/04/2014 - 01/20/2015 Chemotherapy    Patient was started on adjuvant chemotherapy with Taxotere, Carboplatin, Herceptin, Perjeta followed by Herceptin maintenance for 1 year.        02/11/2015 - 03/25/2015 Radiation Therapy    Adjuvant radiation therapy      05/01/2015 -  Anti-estrogen oral therapy    tamoxifen 20 mg daily      01/03/2017 Imaging    Brain MRI: Intra-axial mass right inf cerebellum 33 mm, mass left sup cerebellum 25 mm, leptomeningeal carcinomatosis of cerebellum, left trigeminal nerve involvement, mass effect related to the right inferior cerebellar metastases slight right-to-left shift, 6 mm metastases and foramina of Magendie, punctate focus right frontal  subcortical white matter      01/05/2017 - 01/19/2017 Radiation Therapy    Whole brain radiation therapy      02/16/2017 Pathology Results     cerbrospinal fluid cytology: atypical cels present suspicious for malignancy       CHIEF COMPLIANT: follow-up to discuss treatment plan  INTERVAL HISTORY: [name] is a 31 year old with above-mentioned history metastatic breast cancer with brain metastases. She had Ommaya reservoir placement. We accessed Ommaya was avoided given intrathecal chemotherapy and today stating she was admitted to the hospital with suspicion for meningitis. Repeat tap of the Ommaya reservoir did not reveal any evidence of infection. It was felt that the symptoms could be related to chemical meningitis of inflammatory response to chemotherapy. She is here today to discuss overall treatment plan. She informs me that she does not wish to continue intrathecal chemotherapy treatments.  REVIEW OF SYSTEMS:   Constitutional: Denies fevers, chills or abnormal weight loss Eyes: Denies blurriness of vision Ears, nose, mouth, throat, and face: Denies mucositis or sore throat Respiratory: Denies cough, dyspnea or wheezes Cardiovascular: Denies palpitation, chest discomfort Gastrointestinal:  Denies nausea, heartburn or change in bowel habits Skin: Denies abnormal skin rashes Lymphatics: Denies new lymphadenopathy or easy bruising Neurological:Denies numbness, tingling or new weaknesses Behavioral/Psych: Mood is stable, no new changes  Extremities: No lower extremity edema  All other systems were reviewed with the patient and are negative.  I have reviewed the past medical history, past surgical history, social history and family history with the patient and they are unchanged from previous note.  ALLERGIES:  is allergic to zofran [ondansetron hcl].  MEDICATIONS:  Current Outpatient Prescriptions  Medication Sig Dispense Refill  . acetaminophen (TYLENOL) 500  MG tablet Take 1,000 mg by mouth every 6 (six) hours as needed for headache.    . dexamethasone (DECADRON) 2 MG tablet Take 1 tablet (2 mg total) by mouth daily.    . diphenhydrAMINE (BENADRYL) 25 MG tablet Take 25-50 mg by mouth every 6 (six) hours as needed for itching or allergies.     Marland Kitchen LORazepam (ATIVAN) 0.5 MG tablet Take 1 tablet (0.5 mg total) by mouth every 8 (eight) hours. (Patient taking differently: Take 0.5 mg by mouth every 8 (eight) hours as needed for anxiety or sleep. ) 30 tablet 0  . omeprazole (PRILOSEC) 20 MG capsule Take 1 capsule (20 mg total) by mouth 2 (two) times daily. (Patient taking differently: Take 20 mg by mouth daily. ) 60 capsule 0  . oxyCODONE-acetaminophen (PERCOCET/ROXICET) 5-325 MG tablet Take 1 tablet by mouth every 4 (four) hours as needed for severe pain. 10 tablet 0  . tamoxifen (NOLVADEX) 20 MG tablet Take 1 tablet (20 mg total) by mouth daily. 90 tablet 3   No current facility-administered medications for this visit.    Facility-Administered Medications Ordered in Other Visits  Medication Dose Route Frequency Provider Last Rate Last Dose  . sodium chloride 0.9 % injection 10 mL  10 mL Intravenous PRN Nicholas Lose, MD   10 mL at 12/09/14 1632    PHYSICAL EXAMINATION: ECOG PERFORMANCE STATUS: 1 - Symptomatic but completely ambulatory  Vitals:   02/21/17 1528  BP: 96/67  Pulse: 76  Resp: 18  Temp: 98 F (36.7 C)   Filed Weights   02/21/17 1528  Weight: 113 lb 11.2 oz (51.6 kg)    GENERAL:alert, no distress and comfortable SKIN: skin color, texture, turgor are normal, no rashes or significant lesions EYES: normal, Conjunctiva are pink and non-injected, sclera clear OROPHARYNX:no exudate, no erythema and lips, buccal mucosa, and tongue normal  NECK: supple, thyroid normal size, non-tender, without nodularity LYMPH:  no palpable lymphadenopathy in the cervical, axillary or inguinal LUNGS: clear to auscultation and percussion with normal  breathing effort HEART: regular rate & rhythm and no murmurs and no lower extremity edema ABDOMEN:abdomen soft, non-tender and normal bowel sounds MUSCULOSKELETAL:no cyanosis of digits and no clubbing  NEURO: alert & oriented x 3 with fluent speech, no focal motor/sensory deficits EXTREMITIES: No lower extremity edema  LABORATORY DATA:  I have reviewed the data as listed   Chemistry      Component Value Date/Time   NA 139 02/14/2017 1303   NA 139 02/07/2017 0930   K 3.6 02/14/2017 1303   K 3.9 02/07/2017 0930   CL 106 02/14/2017 1303   CO2 22 02/14/2017 1303   CO2 26 02/07/2017 0930   BUN 18 02/14/2017 1303   BUN 10.9 02/07/2017 0930   CREATININE 0.48 02/14/2017 1303   CREATININE 0.6 02/07/2017 0930      Component Value Date/Time   CALCIUM 9.1 02/14/2017 1303   CALCIUM 9.1 02/07/2017 0930   ALKPHOS 56 02/12/2017 2146   ALKPHOS 74 02/07/2017 0930   AST 24 02/12/2017 2146   AST 13 02/07/2017 0930   ALT 20 02/12/2017 2146   ALT 18 02/07/2017 0930   BILITOT 0.3 02/12/2017 2146   BILITOT 0.47 02/07/2017 0930       Lab Results  Component Value Date   WBC 13.0 (H) 02/14/2017   HGB 10.1 (L)  02/14/2017   HCT 30.6 (L) 02/14/2017   MCV 65.1 (L) 02/14/2017   PLT 312 02/14/2017   NEUTROABS 11.8 (H) 02/14/2017    ASSESSMENT & PLAN:  Malignant neoplasm of lower-outer quadrant of left breast of female, estrogen receptor positive ([place]) Breast cancer of lower-outer quadrant of left female breast ([place]) Left breast invasive ductal carcinoma multifocal disease with multiple nodules ranging from 0.3 cm up to 3.2 cm and 17/20 lymph nodes positive. Positive for lymphovascular invasion, extracapsular tumor extension grade 3 ER 53%, PR 30%, HER-2 positive ratio 5.95, Ki-67 33%, T2, N3, M0 stage IIIc with high-grade DCIS status post left mastectomy 09/06/2014; completed adjuvant chemotherapy 01/20/2015 and completed Herceptin maintenance October 2016 and radiation started 02/11/2015  completed 03/25/2015 by Dr. Isidore Moos. She was on tamoxifen 20 mg daily.  Brain MRI 01/03/2017:Intra-axial mass right inf cerebellum 33 mm, mass left sup cerebellum 25 mm, leptomeningeal carcinomatosis of cerebellum, left trigeminal nerve involvement, mass effect related to the right inferior cerebellar metastases slight right-to-left shift, 6 mm metastases and foramina of Magendie, punctate focus right frontal subcortical white matter  CT CAP 01/03/2017: No evidence of distant metastatic disease beyond the CNS Prognosis: Patient understands that metastatic breast cancer cannot be cured. The goals of treatment are palliation. 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 Harriett Rush Spinal tap: suspicious for cancer cells in the aspirate.  Recommendations: 1. Dr.Anders recommended Herceptin and Perjeta along with tamoxifen  However, the patient rejected all these options and decided to go on Hospice care. She would like to wait to get hospice on board  2. Ommaya tap and intrathecal chemotherapy every 2 weeks Started 02/10/2017. Patient was hospitalized with suspicion for meningitis. It could be chemical meningitis. Patient refuses any further intrathecal chemo.  Pain: I gave her a prescription for Percocet RTC once a month for check ups. She wants to take tamoxifen.  I spent 25 minutes talking to the patient of which more than half was spent in counseling and coordination of care.  No orders of the defined types were placed in this encounter.  The patient has a good understanding of the overall plan. she agrees with it. she will call with any problems that may develop before the next visit here.   Rulon Eisenmenger, MD 02/21/17

## 2017-02-21 NOTE — Progress Notes (Signed)
[facility] Spiritual Care Note  Received referral from May Armel/RN for spiritual/emotional support. Met [name] and her two sisters, with assistance of [name]/interpreter, prior to pt's appt with Dr [name] to introduce [name] as part of their support team. Per pt/family, they value prayer support; knowing that [name] is "in God's hands" is a source of comfort and meaning-making in the midst of high distress.  As next step, will consult with [name]/LCSW and [name]/LCSW re their ongoing support of pt/family to coordinate (and not duplicate) services. Pt and family aware of ongoing chaplain availability, but please also page if immediate needs arise.  Thank you.   [place], [place], [facility] [place] Pager [phone] Voicemail [phone]

## 2017-02-21 NOTE — Progress Notes (Signed)
Sent pt's financial application thru inter office mail to customer service at [facility] attn: Johnna Acosta today.

## 2017-02-21 NOTE — Telephone Encounter (Signed)
Per 4/9 los f/u one month. Per patient interpreter Almyra Free all other appointments are to be cancelled due to patient declines treatment - confirmed with desk nurse. Gave patient interpreter Almyra Free next visit for 5/7 f/u with [facility] - Almyra Free will communicate appointment with patient. Patient did not feel well and needed to leave.

## 2017-02-21 NOTE — Assessment & Plan Note (Signed)
Breast cancer of lower-outer quadrant of left female breast ([place]) Left breast invasive ductal carcinoma multifocal disease with multiple nodules ranging from 0.3 cm up to 3.2 cm and 17/20 lymph nodes positive. Positive for lymphovascular invasion, extracapsular tumor extension grade 3 ER 53%, PR 30%, HER-2 positive ratio 5.95, Ki-67 33%, T2, N3, M0 stage IIIc with high-grade DCIS status post left mastectomy 09/06/2014; completed adjuvant chemotherapy 01/20/2015 and completed Herceptin maintenance October 2016 and radiation started 02/11/2015 completed 03/25/2015 by Dr. Isidore Moos. She was on tamoxifen 20 mg daily.  Brain MRI 01/03/2017:Intra-axial mass right inf cerebellum 33 mm, mass left sup cerebellum 25 mm, leptomeningeal carcinomatosis of cerebellum, left trigeminal nerve involvement, mass effect related to the right inferior cerebellar metastases slight right-to-left shift, 6 mm metastases and foramina of Magendie, punctate focus right frontal subcortical white matter  CT CAP 01/03/2017: No evidence of distant metastatic disease beyond the CNS Prognosis: Patient understands that metastatic breast cancer cannot be cured. The goals of treatment are palliation. 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 Harriett Rush  Recommendations: 1. Herceptin and Perjeta along with tamoxifen (lapatinib was not suggested because there is no safety data for intrathecal chemotherapy and lapatinib given simultaneously) 2. Ommaya tap and intrathecal chemotherapy every 2 weeks Started 02/10/2017. Patient was hospitalized with suspicion for meningitis.   Other options including Tecatinib (available on a clinical trial at [facility]) as well as Neratinib with Capecitabine were discussed

## 2017-02-22 ENCOUNTER — Encounter: Payer: Self-pay | Admitting: *Deleted

## 2017-02-22 NOTE — Progress Notes (Signed)
[name]  Holiday representative received referral from Therapist, sports for emotional support/resources.  CSW met with patient, patients 2 sisters, and interpreter [name]).  CSW offered support and acknowledged the increased emotional needs for patient and family.  CSW had previously discussed [facility] as a resource for patients children.  Patient stated she had made contact initially but was unable to make the appointment.  Patient and family are still interested in [facility] resources.  CSW will contact [facility] to make referral.  CSW explored other possible needs or concerns.  Patient did not express and additional needs.  CSW will continue to follow and support.  CSW encouraged patient to call with questions/concerns.    Johnnye Lana, MSW, LCSW, OSW-C Clinical Social Worker [facility] [phone]

## 2017-02-24 ENCOUNTER — Encounter: Payer: Self-pay | Admitting: Hematology and Oncology

## 2017-02-25 ENCOUNTER — Ambulatory Visit: Payer: Self-pay

## 2017-02-25 ENCOUNTER — Encounter: Payer: Self-pay | Admitting: General Practice

## 2017-02-25 ENCOUNTER — Ambulatory Visit: Admission: RE | Admit: 2017-02-25 | Payer: Self-pay | Source: Ambulatory Visit | Admitting: Radiation Oncology

## 2017-02-25 ENCOUNTER — Ambulatory Visit: Payer: Self-pay | Admitting: Hematology and Oncology

## 2017-02-25 NOTE — Progress Notes (Signed)
[place] Spiritual Care Note  Planned to f/u with pt and family at appt this pm, but appts canceled. Please page if pt on campus and needs arise. Thank you.  [place], [place], [facility] Pager [phone] Voicemail [phone]

## 2017-02-28 ENCOUNTER — Other Ambulatory Visit: Payer: Self-pay | Admitting: Emergency Medicine

## 2017-02-28 MED ORDER — OXYCODONE-ACETAMINOPHEN 5-325 MG PO TABS: 1.0000 | ORAL_TABLET | ORAL | 0 refills | Status: DC | PRN

## 2017-03-14 ENCOUNTER — Other Ambulatory Visit: Payer: Self-pay | Admitting: Emergency Medicine

## 2017-03-14 MED ORDER — GABAPENTIN 300 MG PO CAPS: 300.0000 mg | ORAL_CAPSULE | Freq: Every day | ORAL | 6 refills | Status: AC

## 2017-03-14 MED FILL — GABAPENTIN 300 MG CAPSULE: 300 | 30 days supply | Qty: 30 | Fill #0

## 2017-03-14 NOTE — Telephone Encounter (Signed)
Patient complaining of bilateral foot numbness and pain; states this is causing her lack of sleep; gabapentin sent to pharmacy per Dr Geralyn Flash instructions.

## 2017-03-16 ENCOUNTER — Emergency Department (HOSPITAL_COMMUNITY)
Admission: EM | Admit: 2017-03-16 | Discharge: 2017-03-16 | Disposition: A | Discharge status: Home / Self Care | Payer: Self-pay | Attending: Emergency Medicine | Admitting: Emergency Medicine

## 2017-03-16 ENCOUNTER — Encounter (HOSPITAL_COMMUNITY): Payer: Self-pay | Admitting: *Deleted

## 2017-03-16 ENCOUNTER — Emergency Department (HOSPITAL_COMMUNITY): Payer: Self-pay

## 2017-03-16 DIAGNOSIS — M79605 Pain in left leg: Secondary | ICD-10-CM

## 2017-03-16 DIAGNOSIS — Z853 Personal history of malignant neoplasm of breast: Secondary | ICD-10-CM | POA: Insufficient documentation

## 2017-03-16 DIAGNOSIS — M6281 Muscle weakness (generalized): Secondary | ICD-10-CM | POA: Insufficient documentation

## 2017-03-16 DIAGNOSIS — M79604 Pain in right leg: Secondary | ICD-10-CM

## 2017-03-16 DIAGNOSIS — C799 Secondary malignant neoplasm of unspecified site: Secondary | ICD-10-CM

## 2017-03-16 DIAGNOSIS — C701 Malignant neoplasm of spinal meninges: Secondary | ICD-10-CM | POA: Insufficient documentation

## 2017-03-16 DIAGNOSIS — C721 Malignant neoplasm of cauda equina: Secondary | ICD-10-CM

## 2017-03-16 DIAGNOSIS — R29898 Other symptoms and signs involving the musculoskeletal system: Secondary | ICD-10-CM

## 2017-03-16 DIAGNOSIS — C7949 Secondary malignant neoplasm of other parts of nervous system: Secondary | ICD-10-CM

## 2017-03-16 LAB — I-STAT BETA HCG BLOOD, ED (MC, WL, AP ONLY)

## 2017-03-16 LAB — CBC WITH DIFFERENTIAL/PLATELET
BASOS ABS: 0.1 10*3/uL (ref 0.0–0.1)
Basophils Relative: 1 %
EOS ABS: 0.1 10*3/uL (ref 0.0–0.7)
Eosinophils Relative: 1 %
HEMATOCRIT: 32.9 % — AB (ref 36.0–46.0)
HEMOGLOBIN: 10.4 g/dL — AB (ref 12.0–15.0)
LYMPHS PCT: 22 %
Lymphs Abs: 1.1 10*3/uL (ref 0.7–4.0)
MCH: 21.1 pg — ABNORMAL LOW (ref 26.0–34.0)
MCHC: 31.6 g/dL (ref 30.0–36.0)
MCV: 66.7 fL — ABNORMAL LOW (ref 78.0–100.0)
MONOS PCT: 7 %
Monocytes Absolute: 0.4 10*3/uL (ref 0.1–1.0)
NEUTROS ABS: 3.3 10*3/uL (ref 1.7–7.7)
NEUTROS PCT: 69 %
Platelets: 202 10*3/uL (ref 150–400)
RBC: 4.93 MIL/uL (ref 3.87–5.11)
RDW: 16.8 % — ABNORMAL HIGH (ref 11.5–15.5)
WBC: 5 10*3/uL (ref 4.0–10.5)

## 2017-03-16 LAB — COMPREHENSIVE METABOLIC PANEL
ALBUMIN: 3.7 g/dL (ref 3.5–5.0)
ALK PHOS: 53 U/L (ref 38–126)
ALT: 15 U/L (ref 14–54)
ANION GAP: 8 (ref 5–15)
AST: 19 U/L (ref 15–41)
BUN: 8 mg/dL (ref 6–20)
CO2: 24 mmol/L (ref 22–32)
Calcium: 9.2 mg/dL (ref 8.9–10.3)
Chloride: 109 mmol/L (ref 101–111)
Creatinine, Ser: 0.49 mg/dL (ref 0.44–1.00)
GFR calc non Af Amer: >60 mL/min (ref >60)
GLUCOSE: 90 mg/dL (ref 65–99)
POTASSIUM: 3.5 mmol/L (ref 3.5–5.1)
SODIUM: 141 mmol/L (ref 135–145)
Total Bilirubin: 0.7 mg/dL (ref 0.3–1.2)
Total Protein: 6.2 g/dL — ABNORMAL LOW (ref 6.5–8.1)

## 2017-03-16 LAB — LIPASE, BLOOD: Lipase: 14 U/L (ref 11–51)

## 2017-03-16 MED ORDER — GADOBENATE DIMEGLUMINE 529 MG/ML IV SOLN
10.0000 mL | Freq: Once | INTRAVENOUS | Status: AC
  Administered 2017-03-16: 10 mL via INTRAVENOUS

## 2017-03-16 MED ORDER — OXYCODONE HCL 5 MG PO TABS: 5.0000 mg | ORAL_TABLET | ORAL | 0 refills | Status: DC | PRN

## 2017-03-16 MED ORDER — OXYCODONE-ACETAMINOPHEN 5-325 MG PO TABS
2.0000 | ORAL_TABLET | Freq: Once | ORAL | Status: AC
  Administered 2017-03-16: 2 via ORAL
  Filled 2017-03-16: qty 2

## 2017-03-16 MED ORDER — KETOROLAC TROMETHAMINE 30 MG/ML IJ SOLN
30.0000 mg | Freq: Once | INTRAMUSCULAR | Status: AC
  Administered 2017-03-16: 30 mg via INTRAVENOUS

## 2017-03-16 MED ORDER — HYDROMORPHONE HCL 1 MG/ML IJ SOLN
1.0000 mg | Freq: Once | INTRAMUSCULAR | Status: AC
  Administered 2017-03-16: 1 mg via INTRAVENOUS
  Filled 2017-03-16: qty 1

## 2017-03-16 NOTE — ED Triage Notes (Signed)
Pt has brain and spine cancer and had chemo 83months ago.  Pt has pain to right leg and radiates to the back.  Pt states that her leg is numb, pulse palpable.  Pt tearful, no sob.

## 2017-03-16 NOTE — ED Provider Notes (Signed)
[place] DEPT Provider Note   CSN: [id number] Arrival date & time: 03/16/17  0855     History   Chief Complaint Chief Complaint  Patient presents with  . Leg Pain    HPI [name] is a 31 y.o. female.  HPI Patient has a history of metastatic breast cancer with leptomeningeal spread who presents to emergency department complaining of increasing pain to her bilateral legs with radiation down the back sides.  She reports paresthesias in her bilateral lower extremities.  No difficulty urinating.  Denies abdominal pain.  No headaches.  No recent change in her medications.  She has advanced cancer and is recently stopped intrathecal chemotherapy secondary to intolerance.  Hospice has not been involved to this point.  Spanish interpreter utilized for history.  No fevers or chills.   Past Medical History:  Diagnosis Date  . Anemia    hx  . Broken tooth 12/01/2015   left upper back  . Cancer ([place])   . Headache   . History of breast cancer 2015   left  . History of chemotherapy    finished 07/2015  . Multiple blisters 11/2015   tongue - states from chemo    Patient Active Problem List   Diagnosis Date Noted  . Sepsis ([place]) 02/13/2017  . Bacterial meningitis   . Lactic acidosis   . Cancer with leptomeningeal spread ([place]) 02/01/2017  . Metastatic breast cancer ([place])   . Palliative care by specialist   . DNR (do not resuscitate) discussion   . Brain metastases ([place]) 01/04/2017  . Goals of care, counseling/discussion 01/04/2017  . 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 Shingles 05/27/2016  . Genetic testing 09/22/2015  . Lymphedema 07/06/2015  . Arm pain 07/06/2015  . Heat intolerance 07/06/2015  . 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 Vaginal irritation 02/14/2015  . Malignant neoplasm of lower-outer quadrant of left breast of female, estrogen receptor positive ([place]) 07/26/2014    Past Surgical History:  Procedure Laterality Date  . APPLICATION OF CRANIAL  NAVIGATION N/A 02/01/2017   Procedure: APPLICATION OF CRANIAL NAVIGATION;  Surgeon: Kevan Ny Ditty, MD;  Location: [place];  Service: Neurosurgery;  Laterality: N/A;  . BREAST LUMPECTOMY Left 07/10/2014   Procedure: LEFT BREAST LUMPECTOMY;  Surgeon: Merrie Roof, MD;  Location: [place];  Service: General;  Laterality: Left;  . CESAREAN SECTION  09/09/2007  . CHEMO RESERVIOR INSERTION N/A 02/01/2017   Procedure: [place] placement with Brainlab;  Surgeon: Kevan Ny Ditty, MD;  Location: [place];  Service: Neurosurgery;  Laterality: N/A;  [place] placement with Brain lab  . MASTECTOMY W/ SENTINEL NODE BIOPSY Left 09/06/2014   Procedure: LEFT MASTECTOMY WITH SENTINEL LYMPH NODE BIOPSY/NODE MAPPING;  Surgeon: Autumn Messing III, MD;  Location: [place];  Service: General;  Laterality: Left;  . PORT-A-CATH REMOVAL N/A 12/08/2015   Procedure: REMOVAL PORT-A-CATH;  Surgeon: Autumn Messing III, MD;  Location: [place];  Service: General;  Laterality: N/A;  . PORTACATH PLACEMENT Right 09/06/2014   Procedure: INSERTION PORT-A-CATH;  Surgeon: Autumn Messing III, MD;  Location: [place];  Service: General;  Laterality: Right;    OB History    Gravida Para Term Preterm AB Living   2 2 2     2    SAB TAB Ectopic Multiple Live Births           1       Home Medications    Prior to Admission medications   Medication  Sig Start Date End Date Taking? Authorizing Provider  acetaminophen (TYLENOL) 500 MG tablet Take 1,000 mg by mouth every 6 (six) hours as needed for headache.   Yes Historical Provider, MD  diphenhydrAMINE (BENADRYL) 25 MG tablet Take 25-50 mg by mouth every 6 (six) hours as needed for itching or allergies.    Yes Historical Provider, MD  gabapentin (NEURONTIN) 300 MG capsule Take 1 capsule (300 mg total) by mouth at bedtime. 03/14/17  Yes Nicholas Lose, MD  LORazepam (ATIVAN) 0.5 MG tablet Take 1 tablet (0.5 mg total) by mouth every 8 (eight) hours. Patient  taking differently: Take 0.5 mg by mouth every 8 (eight) hours as needed for anxiety or sleep.  01/04/17  Yes Nicholas Lose, MD  omeprazole (PRILOSEC) 20 MG capsule Take 1 capsule (20 mg total) by mouth 2 (two) times daily. Patient taking differently: Take 20 mg by mouth daily.  01/03/17  Yes Tanna Furry, MD  oxyCODONE-acetaminophen (PERCOCET/ROXICET) 5-325 MG tablet Take 1 tablet by mouth every 4 (four) hours as needed for severe pain. 02/28/17  Yes Nicholas Lose, MD  tamoxifen (NOLVADEX) 20 MG tablet Take 1 tablet (20 mg total) by mouth daily. 12/03/16  Yes Nicholas Lose, MD  dexamethasone (DECADRON) 2 MG tablet Take 1 tablet (2 mg total) by mouth daily. Patient not taking: Reported on 03/16/2017 02/14/17   Shon Millet, DO  oxyCODONE (ROXICODONE) 5 MG immediate release tablet Take 1-2 tablets (5-10 mg total) by mouth every 4 (four) hours as needed for severe pain. 03/16/17   Jola Schmidt, MD    Family History Family History  Problem Relation Age of Onset  . Diabetes Mother   . Hypertension Mother   . Diabetes Maternal Grandmother   . Hypertension Maternal Grandmother   . Stomach cancer Maternal Grandmother   . Stomach cancer Maternal Grandfather     Social History Social History  Substance Use Topics  . Smoking status: Never Smoker  . Smokeless tobacco: Never Used  . Alcohol use No     Allergies   Zofran [ondansetron hcl]   Review of Systems Review of Systems  All other systems reviewed and are negative.    Physical Exam Updated Vital Signs BP 103/68 (BP Location: Right Arm)   Pulse 64   Temp 98.1 F (36.7 C) (Oral)   Resp 16   SpO2 100%   Physical Exam  Constitutional: She is oriented to person, place, and time. No distress.  HENT:  Head: Normocephalic and atraumatic.  Eyes: EOM are normal.  Neck: Normal range of motion.  Cardiovascular: Normal rate and regular rhythm.   Pulmonary/Chest: Effort normal and breath sounds normal.  Abdominal: Soft. She exhibits  no distension. There is no tenderness.  Musculoskeletal:  Full range of motion bilateral hips, knees, ankles.  Normal PT and DP pulses bilaterally.  No unilateral leg swelling.  No thoracic or lumbar point tenderness.  Neurological: She is alert and oriented to person, place, and time.  Skin: Skin is warm and dry.  Psychiatric: She has a normal mood and affect. Judgment normal.  Nursing note and vitals reviewed.    ED Treatments / Results  Labs (all labs ordered are listed, but only abnormal results are displayed) Labs Reviewed  COMPREHENSIVE METABOLIC PANEL - Abnormal; Notable for the following:       Result Value   Total Protein 6.2 (*)    All other components within normal limits  CBC WITH DIFFERENTIAL/PLATELET - Abnormal; Notable for the following:  Hemoglobin 10.4 (*)    HCT 32.9 (*)    MCV 66.7 (*)    MCH 21.1 (*)    RDW 16.8 (*)    All other components within normal limits  LIPASE, BLOOD  I-STAT BETA HCG BLOOD, ED ([facility], [facility], [facility] ONLY)    EKG  EKG Interpretation None       Radiology Mr Thoracic Spine W Wo Contrast  Result Date: 03/16/2017 CLINICAL DATA:  Lower extremity weakness.  Metastatic breast cancer. EXAM: MRI THORACIC AND LUMBAR SPINE WITHOUT AND WITH CONTRAST TECHNIQUE: Multiplanar and multiecho pulse sequences of the thoracic and lumbar spine were obtained without and with intravenous contrast. CONTRAST:  66mL MULTIHANCE GADOBENATE DIMEGLUMINE 529 MG/ML IV SOLN COMPARISON:  01/13/2017 FINDINGS: MRI THORACIC SPINE FINDINGS Alignment:  Physiologic. Vertebrae: No fracture, evidence of discitis, or bone lesion. Cord: There is small nodular enhancing tumor along the lower cord, greatest posteriorly to the conus as described below. No evidence of intramedullary metastasis or edema. Paraspinal and other soft tissues: Negative Disc levels: No degenerative changes or impingement MRI LUMBAR SPINE FINDINGS Segmentation:  Standard. Alignment:  Physiologic. Vertebrae:  No  fracture, evidence of discitis, or bone lesion. Conus medullaris: Extends to the L1-2 level. Nerve roots of the cauda equina show nodular thickening and enhancement diffusely. The degree of thickening has progressed from prior, and fills most of the CSF space at the level of L1. Paraspinal and other soft tissues: Fluid-filled structure anterior to the uterus is presumably the bladder. Disc levels: No degenerative changes or impingement IMPRESSION: 1. 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 There is generalized progressive tumor burden since 01/13/2017. 2. Negative for osseous metastasis. Electronically Signed   By: Monte Fantasia M.D.   On: 03/16/2017 13:57   Mr Lumbar Spine W Wo Contrast  Result Date: 03/16/2017 CLINICAL DATA:  Lower extremity weakness.  Metastatic breast cancer. EXAM: MRI THORACIC AND LUMBAR SPINE WITHOUT AND WITH CONTRAST TECHNIQUE: Multiplanar and multiecho pulse sequences of the thoracic and lumbar spine were obtained without and with intravenous contrast. CONTRAST:  72mL MULTIHANCE GADOBENATE DIMEGLUMINE 529 MG/ML IV SOLN COMPARISON:  01/13/2017 FINDINGS: MRI THORACIC SPINE FINDINGS Alignment:  Physiologic. Vertebrae: No fracture, evidence of discitis, or bone lesion. Cord: There is small nodular enhancing tumor along the lower cord, greatest posteriorly to the conus as described below. No evidence of intramedullary metastasis or edema. Paraspinal and other soft tissues: Negative Disc levels: No degenerative changes or impingement MRI LUMBAR SPINE FINDINGS Segmentation:  Standard. Alignment:  Physiologic. Vertebrae:  No fracture, evidence of discitis, or bone lesion. Conus medullaris: Extends to the L1-2 level. Nerve roots of the cauda equina show nodular thickening and enhancement diffusely. The degree of thickening has progressed from prior, and fills most of the CSF space at the level of L1. Paraspinal and other soft tissues: Fluid-filled structure  anterior to the uterus is presumably the bladder. Disc levels: No degenerative changes or impingement IMPRESSION: 1. 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 There is generalized progressive tumor burden since 01/13/2017. 2. Negative for osseous metastasis. Electronically Signed   By: Monte Fantasia M.D.   On: 03/16/2017 13:57    Procedures Procedures (including critical care time)  Medications Ordered in ED Medications  HYDROmorphone (DILAUDID) injection 1 mg (1 mg Intravenous Given 03/16/17 1043)  ketorolac (TORADOL) 30 MG/ML injection 30 mg (30 mg Intravenous Given 03/16/17 1043)  gadobenate dimeglumine (MULTIHANCE) injection 10 mL (10 mLs Intravenous Contrast Given 03/16/17 1313)  oxyCODONE-acetaminophen (PERCOCET/ROXICET) 5-325 MG per tablet 2 tablet (2 tablets Oral Given 03/16/17 1442)     Initial Impression / Assessment and Plan / ED Course  I have reviewed the triage vital signs and the nursing notes.  Pertinent labs & imaging results that were available during my care of the patient were reviewed by me and considered in my medical decision making (see chart for details).     Patient with resolution of her symptoms in emergency department is feeling much better.  Her pain is controlled.  She has very advanced metastatic breast cancer and has a very poor prognosis.  MRI today demonstrates worsening tumor burden of the cauda equina.  She may be a candidate for localized radiation to this area.  She will follow-up with her radiation oncology team.  She is ambulatory.  No indication for acute management in the hospital.  She'll be discharged home with 5 mg Roxicodone to be taken as needed in addition to her Percocet tab better control her pain.  I long discussion with the patient regarding goals of care and at this point I think she is still on the fence regarding ongoing treatment versus palliation.  I spoke with her family at length and they understand her poor  prognosis.  I think in the next week or 2 the patient will likely fully understand her situation as well and except hospice.  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

## 2017-03-18 ENCOUNTER — Other Ambulatory Visit: Payer: Self-pay

## 2017-03-18 ENCOUNTER — Ambulatory Visit: Payer: Self-pay

## 2017-03-21 ENCOUNTER — Ambulatory Visit: Payer: Self-pay | Admitting: Hematology and Oncology

## 2017-03-21 ENCOUNTER — Encounter: Payer: Self-pay | Admitting: General Practice

## 2017-03-21 NOTE — Progress Notes (Signed)
[place] Spiritual Care Note  Attempted to f/u after pt's appt with Dr Lindi Adie in order to bring a prayer shawl and to offer encouragement, but there was either a no-show or a cancellation.   Please page if needs arise when pt on campus. Thank you.  [place], [place], [facility] Pager [phone] Voicemail [phone]

## 2017-03-21 NOTE — Assessment & Plan Note (Deleted)
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 Positive for lymphovascular invasion, extracapsular tumor extension grade 3 ER 53%, PR 30%, HER-2 positive ratio 5.95, Ki-67 33%, T2, N3, M0 stage IIIc with high-grade DCIS status post left mastectomy 09/06/2014; completed adjuvant chemotherapy 01/20/2015 and completed Herceptin maintenance October 2016 and radiation started 02/11/2015 completed 03/25/2015 by Dr. Squire. She was on tamoxifen 20 mg daily.  Brain MRI 01/03/2017:Intra-axial mass right inf cerebellum 33 mm, mass left sup cerebellum 25 mm, leptomeningeal carcinomatosis of cerebellum, left trigeminal nerve involvement, mass effect related to the right inferior cerebellar metastases slight right-to-left shift, 6 mm metastases and foramina of [place], punctate focus right frontal subcortical white matter  CT CAP 01/03/2017: No evidence of distant metastatic disease beyond the CNS Prognosis: Patient understands that metastatic breast cancer cannot be cured. The goals of treatment are palliation. Palliative radiation therapy completed to the brain 01/05/2017- 01/19/17  MRI spine: 01/13/2017: Spinal leptomeningeal carcinomatosis is present T8, extending to S2 Patient went for second opinion at [facility] and met with Dr. Cary Anders Spinal tap: suspicious for cancer cells in the aspirate.  Recommendations: 1. Dr.Anders recommended Herceptin and Perjeta along with tamoxifen  However, the patient rejected all these options and decided to go on Hospice care. She would like to wait to get hospice on board  2. [place] tap and intrathecal chemotherapy every 2 weeks Started 02/10/2017. After first intrathecal, she was hospitalized for chemical meningitis.  Subsequently patient refused to undergo any further treatment for breast cancer. She wanted to live her life without coming back and forth for these  treatments. 

## 2017-03-24 ENCOUNTER — Other Ambulatory Visit: Payer: Self-pay

## 2017-03-24 MED ORDER — OXYCODONE-ACETAMINOPHEN 5-325 MG PO TABS: 1.0000 | ORAL_TABLET | ORAL | 0 refills | Status: AC | PRN

## 2017-03-24 MED ORDER — OXYCODONE HCL 5 MG PO TABS: 5.0000 mg | ORAL_TABLET | ORAL | 0 refills | Status: AC | PRN

## 2017-03-24 MED FILL — oxyCODONE HCL 5 MG TABS: 5 | 10 days supply | Qty: 120 | Fill #0

## 2017-03-24 MED FILL — OXYCODONE-ACETAMINOPHEN 5-3: 5-325 | 5 days supply | Qty: 30 | Fill #0

## 2017-03-24 NOTE — Progress Notes (Signed)
Almyra Free (interpreter) states that pt needs a refill for her pain medication. Pt is in [place] receiving hydrotherapy as an alternative for cancer treatment. Pt states that she is having a lot of pain and headache and needs a refill on her pain medication. She had been taking Oxy IR every 4 hrs since last week when she was prescribed this medication from the ED. Pt is out of her regular percocet. Notified Dr.Gudena and is aware. Obtained order to refill Oxy IR. Pt sister will be stopping by today to pick up prescription. Almyra Free (interpreter) notified pt and is aware.

## 2017-03-24 NOTE — Progress Notes (Signed)
Gave Ms. Marlowe Sax (pt sister) pt prescription for oxy IR and percocet. Called to notify pt of sister picking up prescription. No vm set and no answer. Will try again later.

## 2017-04-08 ENCOUNTER — Other Ambulatory Visit: Payer: Self-pay

## 2017-04-08 ENCOUNTER — Ambulatory Visit: Payer: Self-pay

## 2017-04-12 ENCOUNTER — Emergency Department (HOSPITAL_COMMUNITY)
Admission: EM | Admit: 2017-04-12 | Discharge: 2017-04-13 | Disposition: A | Discharge status: Home / Self Care | Payer: Self-pay | Attending: Emergency Medicine | Admitting: Emergency Medicine

## 2017-04-12 ENCOUNTER — Encounter (HOSPITAL_COMMUNITY): Payer: Self-pay | Admitting: Emergency Medicine

## 2017-04-12 DIAGNOSIS — C50919 Malignant neoplasm of unspecified site of unspecified female breast: Secondary | ICD-10-CM

## 2017-04-12 DIAGNOSIS — M542 Cervicalgia: Secondary | ICD-10-CM | POA: Insufficient documentation

## 2017-04-12 DIAGNOSIS — C412 Malignant neoplasm of vertebral column: Secondary | ICD-10-CM | POA: Insufficient documentation

## 2017-04-12 DIAGNOSIS — C7981 Secondary malignant neoplasm of breast: Secondary | ICD-10-CM | POA: Insufficient documentation

## 2017-04-12 DIAGNOSIS — K5903 Drug induced constipation: Secondary | ICD-10-CM | POA: Insufficient documentation

## 2017-04-12 DIAGNOSIS — R519 Headache, unspecified: Secondary | ICD-10-CM

## 2017-04-12 DIAGNOSIS — R51 Headache: Secondary | ICD-10-CM

## 2017-04-12 DIAGNOSIS — C716 Malignant neoplasm of cerebellum: Secondary | ICD-10-CM | POA: Insufficient documentation

## 2017-04-12 DIAGNOSIS — Z79899 Other long term (current) drug therapy: Secondary | ICD-10-CM | POA: Insufficient documentation

## 2017-04-12 DIAGNOSIS — T402X5A Adverse effect of other opioids, initial encounter: Secondary | ICD-10-CM

## 2017-04-12 DIAGNOSIS — G8929 Other chronic pain: Secondary | ICD-10-CM

## 2017-04-12 HISTORY — DX: Malignant neoplasm of brain, unspecified: C71.9

## 2017-04-12 NOTE — ED Triage Notes (Signed)
Pt to ED c/o severe headache, nausea with vomiting and back pain.  Vomiting started approx 3 hours ago.  Pt has brain cancer and is currently on chemo therapy

## 2017-04-13 ENCOUNTER — Emergency Department (HOSPITAL_COMMUNITY): Payer: Self-pay

## 2017-04-13 ENCOUNTER — Other Ambulatory Visit: Payer: Self-pay

## 2017-04-13 DIAGNOSIS — C7949 Secondary malignant neoplasm of other parts of nervous system: Secondary | ICD-10-CM

## 2017-04-13 DIAGNOSIS — C7931 Secondary malignant neoplasm of brain: Secondary | ICD-10-CM

## 2017-04-13 DIAGNOSIS — C50512 Malignant neoplasm of lower-outer quadrant of left female breast: Secondary | ICD-10-CM

## 2017-04-13 DIAGNOSIS — C50919 Malignant neoplasm of unspecified site of unspecified female breast: Secondary | ICD-10-CM

## 2017-04-13 DIAGNOSIS — Z170 Estrogen receptor positive status [ER+]: Principal | ICD-10-CM

## 2017-04-13 MED ORDER — HYDROMORPHONE HCL 1 MG/ML IJ SOLN
1.0000 mg | Freq: Once | INTRAMUSCULAR | Status: AC
  Administered 2017-04-13: 1 mg via INTRAVENOUS
  Filled 2017-04-13: qty 1

## 2017-04-13 MED ORDER — HYDROMORPHONE HCL 2 MG PO TABS: 2.0000 mg | ORAL_TABLET | ORAL | 0 refills | Status: AC | PRN

## 2017-04-13 MED ORDER — SODIUM CHLORIDE 0.9 % IV BOLUS (SEPSIS)
1000.0000 mL | Freq: Once | INTRAVENOUS | Status: AC
  Administered 2017-04-13: 1000 mL via INTRAVENOUS

## 2017-04-13 MED ORDER — FENTANYL 25 MCG/HR TD PT72: 25.0000 ug | MEDICATED_PATCH | TRANSDERMAL | 0 refills | Status: AC

## 2017-04-13 MED ORDER — METOCLOPRAMIDE HCL 5 MG/ML IJ SOLN
10.0000 mg | Freq: Once | INTRAMUSCULAR | Status: AC
  Administered 2017-04-13: 10 mg via INTRAVENOUS
  Filled 2017-04-13: qty 2

## 2017-04-13 MED ORDER — HYDROMORPHONE HCL 2 MG PO TABS: 2.0000 mg | ORAL_TABLET | ORAL | 0 refills | Status: DC | PRN

## 2017-04-13 MED ORDER — DIPHENHYDRAMINE HCL 50 MG/ML IJ SOLN
25.0000 mg | Freq: Once | INTRAMUSCULAR | Status: AC
  Administered 2017-04-13: 25 mg via INTRAVENOUS
  Filled 2017-04-13: qty 1

## 2017-04-13 MED ORDER — NALOXEGOL OXALATE 25 MG PO TABS: 25.0000 mg | ORAL_TABLET | Freq: Every day | ORAL | 0 refills | Status: AC

## 2017-04-13 MED ORDER — METOCLOPRAMIDE HCL 10 MG PO TABS: 10.0000 mg | ORAL_TABLET | Freq: Four times a day (QID) | ORAL | 0 refills | Status: AC | PRN

## 2017-04-13 MED FILL — fentaNYL 25 MCG/HR PT72: 25 | 15 days supply | Qty: 5 | Fill #0

## 2017-04-13 MED FILL — HYDROmorphone HCL 2 MG TABS: 2 | 10 days supply | Qty: 120 | Fill #0

## 2017-04-13 NOTE — ED Notes (Signed)
Pt stable, understands discharge instructions, and reasons for return.   

## 2017-04-13 NOTE — Discharge Instructions (Signed)
Tome 1-2 tabletas de hidromorfona cada cuatro horas segn sea necesario para Conservation officer, historic buildings. Haga un seguimiento con su mdico de cncer para obtener recetas adicionales.

## 2017-04-13 NOTE — Progress Notes (Signed)
Pt's medical interpreter Junita Push) states that pt would like to have a palliative care consult. Pt had tried to seek natural ways to manage her cancer, but was unsuccessful. Pt's headache and pain is getting worse, despite pain medications at home. Pt was in the ed yesterday and was prescribed dilaudid, which was effective in pain control. Placed for palliative care referral for this pt. Will discuss with Dr.Gudena of other pain medication to help with her pain. Pt does not find oxy ir/ percocet effective. Will call pt back through interpreter Junita Push) and update her with pain medication at home.    [place] husband, Elita Quick, came to pick up pain prescription for dilaudid and fentanyl patch obtained from [place]. Almyra Free (interpreter) was present to make sure pt husband understands instructions for pain medication. Instructed that fentanyl patch must only be changed every 3 days. If it falls off, make sure to wear gloves or use a paper towel to pick it up. The medication may transfer into the skin very easily. Careful instructions given to pt's husband to be watchful when pt is around her children, that the patch does not accidentally fall off. May benefit from having a gauze or tape over the patch for more secure application. Notified pt husband that palliative care was consulted and should be getting in touch with them tomorrow. I will follow up to make sure that they have communicated with pt as well. Instructed pt to stop taking percocet/oxy IR since it doesn't work for pt. Dilaudid may be taken every 4 hrs. Encouraged to increase fluid intake and to take miralax BID and stool softeners with plenty of water to help relieve constipation from taking pain medication.Pt husband verbalized undertstanding and will make sure pt follows instructions as directed. If pt pain worsens, may need to go to ED and get evaluated and treated. Advised to call with any more questions. Also, gave signed letter request for pt mother  to come visit in the [place] due to pt illness/poor prognosis.

## 2017-04-13 NOTE — ED Provider Notes (Signed)
[facility] DEPT Provider Note   CSN: [id number] Arrival date & time: 04/12/17  2321   By signing my name below, I, Eunice Blase, attest that this documentation has been prepared under the direction and in the presence of Delora Fuel, MD. Electronically signed, Eunice Blase, ED Scribe. 04/13/17. 12:56 AM.   History   Chief Complaint Chief Complaint  Patient presents with  . Headache  . Back Pain   The history is provided by the patient, a relative and medical records. The history is limited by a language barrier. A language interpreter was used (relative acting as interpretor).    [name] is a 31 y.o. female with h/o chemotherapy, breast cancer that has spread to the spine and brain, and anemia who presents to the Emergency Department with concern for intermittent headache across the forehead x 3 weeks. Associated eye pains, nausea, vomiting that triage states began 3 hours PTA, leg numbness radiating downward from the anterior thighs to the toes, photophobia/ phonophobia, weakness and decreased solid and fluid intake. Pt also c/o constipation and is currently taking prescribed oxycodone for pain at home. Pt's worsening, 9/10, episodic headaches allegedly last ~5 minutes at a time with pain that radiates to the L shoulder and L side of the "spinal column". Pt taking oxycodone every 4 hours without relief. Family notes palliative care discussions with hospice have been initiated recently. Chemotherapy noted in the past, which the pt discontinued recently; family states she has recently sought the care of a "natural" specialist. Family adds the pt is ambulatory with assistance from others at baseline. Head C/T noted ~2 weeks ago out of town; family states radiology specialists were unable to visualize any growths at the time of this evaluation. No other complaints at this time.   Past Medical History:  Diagnosis Date  . Anemia    hx  . Brain cancer ([place])   . Broken  tooth 12/01/2015   left upper back  . Cancer ([place])   . Headache   . History of breast cancer 2015   left  . History of chemotherapy    finished 07/2015  . Multiple blisters 11/2015   tongue - states from chemo    Patient Active Problem List   Diagnosis Date Noted  . Sepsis ([place]) 02/13/2017  . Bacterial meningitis   . Lactic acidosis   . 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 Metastatic breast cancer ([place])   . Palliative care by specialist   . DNR (do not resuscitate) discussion   . Brain metastases ([place]) 01/04/2017  . Goals of care, counseling/discussion 01/04/2017  . 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 Shingles 05/27/2016  . Genetic testing 09/22/2015  . Lymphedema 07/06/2015  . Arm pain 07/06/2015  . Heat intolerance 07/06/2015  . 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 Vaginal irritation 02/14/2015  . Malignant neoplasm of lower-outer quadrant of left breast of female, estrogen receptor positive ([place]) 07/26/2014    Past Surgical History:  Procedure Laterality Date  . APPLICATION OF CRANIAL NAVIGATION N/A 02/01/2017   Procedure: APPLICATION OF CRANIAL NAVIGATION;  Surgeon: Kevan Ny Ditty, MD;  Location: [facility];  Service: Neurosurgery;  Laterality: N/A;  . BREAST LUMPECTOMY Left 07/10/2014   Procedure: LEFT BREAST LUMPECTOMY;  Surgeon: Merrie Roof, MD;  Location: [facility];  Service: General;  Laterality: Left;  . CESAREAN SECTION  09/09/2007  . CHEMO RESERVIOR INSERTION N/A 02/01/2017   Procedure: [place] placement with [facility];  Surgeon: Kevan Ny Ditty, MD;  Location: [facility];  Service: Neurosurgery;  Laterality: N/A;  [place] placement with [facility]  . MASTECTOMY W/ SENTINEL NODE BIOPSY Left 09/06/2014   Procedure: LEFT MASTECTOMY WITH SENTINEL LYMPH NODE BIOPSY/NODE MAPPING;  Surgeon: Autumn Messing III, MD;  Location: [facility];  Service: General;  Laterality: Left;  . PORT-A-CATH REMOVAL N/A 12/08/2015   Procedure: REMOVAL  PORT-A-CATH;  Surgeon: Autumn Messing III, MD;  Location: [facility];  Service: General;  Laterality: N/A;  . PORTACATH PLACEMENT Right 09/06/2014   Procedure: INSERTION PORT-A-CATH;  Surgeon: Autumn Messing III, MD;  Location: [facility];  Service: General;  Laterality: Right;    OB History    Gravida Para Term Preterm AB Living   2 2 2     2    SAB TAB Ectopic Multiple Live Births           1       Home Medications    Prior to Admission medications   Medication Sig Start Date End Date Taking? Authorizing Provider  acetaminophen (TYLENOL) 500 MG tablet Take 1,000 mg by mouth every 6 (six) hours as needed for headache.    [provider]  dexamethasone (DECADRON) 2 MG tablet Take 1 tablet (2 mg total) by mouth daily. Patient not taking: Reported on 03/16/2017 02/14/17   Dessa Phi Chahn-Yang, DO  diphenhydrAMINE (BENADRYL) 25 MG tablet Take 25-50 mg by mouth every 6 (six) hours as needed for itching or allergies.     [provider]  gabapentin (NEURONTIN) 300 MG capsule Take 1 capsule (300 mg total) by mouth at bedtime. 03/14/17   Nicholas Lose, MD  LORazepam (ATIVAN) 0.5 MG tablet Take 1 tablet (0.5 mg total) by mouth every 8 (eight) hours. Patient taking differently: Take 0.5 mg by mouth every 8 (eight) hours as needed for anxiety or sleep.  01/04/17   Nicholas Lose, MD  omeprazole (PRILOSEC) 20 MG capsule Take 1 capsule (20 mg total) by mouth 2 (two) times daily. Patient taking differently: Take 20 mg by mouth daily.  01/03/17   Tanna Furry, MD  oxyCODONE (ROXICODONE) 5 MG immediate release tablet Take 1-2 tablets (5-10 mg total) by mouth every 4 (four) hours as needed for severe pain. 03/24/17   Nicholas Lose, MD  oxyCODONE-acetaminophen (PERCOCET/ROXICET) 5-325 MG tablet Take 1 tablet by mouth every 4 (four) hours as needed for severe pain. 03/24/17   Nicholas Lose, MD  tamoxifen (NOLVADEX) 20 MG tablet Take 1 tablet (20 mg total) by mouth daily. 12/03/16   Nicholas Lose, MD    Family History Family History  Problem Relation Age of Onset  . Diabetes Mother   . Hypertension Mother   . Diabetes Maternal Grandmother   . Hypertension Maternal Grandmother   . Stomach cancer Maternal Grandmother   . Stomach cancer Maternal Grandfather     Social History Social History  Substance Use Topics  . Smoking status: Never Smoker  . Smokeless tobacco: Never Used  . Alcohol use No     Allergies   Zofran [ondansetron hcl]   Review of Systems Review of Systems  Eyes: Positive for photophobia and pain.  Gastrointestinal: Positive for nausea and vomiting.  Musculoskeletal: Positive for arthralgias, back pain, gait problem (baseline) and neck pain.  Skin: Negative for wound.  Neurological: Positive for weakness, numbness and headaches.  All other systems reviewed and are negative.    Physical Exam Updated Vital Signs BP 92/71 (BP Location: Right Arm)   Pulse 92  Temp 98.2 F (36.8 C) (Oral)   Resp 19   Ht 5' (1.524 m)   Wt 108 lb (49 kg)   SpO2 99%   BMI 21.09 kg/m   Physical Exam  Constitutional: She is oriented to person, place, and time. She appears well-developed and well-nourished.  HENT:  Head: Normocephalic and atraumatic.  Ommaya reservoir present on the R side of the vertex.  Eyes: EOM and lids are normal. Pupils are equal, round, and reactive to light. Lids are everted and swept, no foreign bodies found.  Fundi NL  Neck: Normal range of motion. Neck supple. No JVD present.  Moderate to severe meningismus  Cardiovascular: Normal rate, regular rhythm and normal heart sounds.   No murmur heard. Pulmonary/Chest: Effort normal and breath sounds normal. She has no wheezes. She has no rales. She exhibits no tenderness.  Abdominal: Soft. Bowel sounds are normal. She exhibits no distension and no mass. There is no tenderness.  Musculoskeletal: Normal range of motion. She exhibits no edema.  Lymphadenopathy:    She has no cervical  adenopathy.  Neurological: She is alert and oriented to person, place, and time. No cranial nerve deficit. She exhibits normal muscle tone. Coordination normal.  Moderate weakness of L leg with strength 3/5. Mild weakness R leg 4/5.  Skin: Skin is warm and dry. No rash noted.  Psychiatric: She has a normal mood and affect. Her behavior is normal. Judgment and thought content normal.  Nursing note and vitals reviewed.    ED Treatments / Results  DIAGNOSTIC STUDIES: Oxygen Saturation is 99% on RA, NL by my interpretation.    COORDINATION OF CARE: 12:38 AM-Discussed next steps with pt. Pt verbalized understanding and is agreeable with the plan. Will order fluids and medications.  Radiology Ct Head Wo Contrast  Result Date: 04/13/2017 CLINICAL DATA:  Headache. History of breast cancer with known brain metastasis. EXAM: CT HEAD WITHOUT CONTRAST TECHNIQUE: Contiguous axial images were obtained from the base of the skull through the vertex without intravenous contrast. COMPARISON:  CT HEAD February 12, 2017 and MRI of the head January 03, 2017 FINDINGS: BRAIN: No intraparenchymal hemorrhage, mass effect nor midline shift. RIGHT frontal reservoir and catheter with distal tip terminates in the frontal horn of the RIGHT lateral ventricle, no hydrocephalus. Stable gliosis along the catheter tract. Patient's known cerebellar metastasis not apparent by CT. No acute large vascular territory infarcts. No abnormal extra-axial fluid collections. VASCULAR: Unremarkable. SKULL/SOFT TISSUES: No skull fracture. RIGHT frontal burr hole. No destructive bony lesions. No significant soft tissue swelling. ORBITS/SINUSES: The included ocular globes and orbital contents are normal.Under pneumatized LEFT mastoid air cells with trace effusion. OTHER: None. IMPRESSION: No acute intracranial process. Stable position of RIGHT frontal reservoir and catheter. Chronic LEFT mastoiditis. Electronically Signed   By: Elon Alas  M.D.   On: 04/13/2017 02:19    Procedures Procedures (including critical care time)  Medications Ordered in ED Medications - No data to display   Initial Impression / Assessment and Plan / ED Course  I have reviewed the triage vital signs and the nursing notes.  Pertinent imaging results that were available during my care of the patient were reviewed by me and considered in my medical decision making (see chart for details).  Patient with metastatic breast cancer currently looking to get started with palliative care. Review of old records confirms diagnosis of metastatic cancer involving cerebellum and lower parts of spinal column. Pain has not been adequately controlled with oxycodone at home.  She is given a dose of hydromorphone here with good relief of pain. She's given a dose of metoclopramide with good relief of nausea. CT of head done here shows no change from baseline. She is also concerned about constipation. She is discharged with prescriptions for hydromorphone, metoclopramide, and naloxegol. She is referred back to her oncologist for follow-up. Also, given referral to palliative care. Return if pain and nausea are not adequately being controlled at home.  Final Clinical Impressions(s) / ED Diagnoses   Final diagnoses:  Chronic intractable headache, unspecified headache type  Metastatic breast cancer ([place])  Chronic neck pain  Constipation due to opioid therapy    New Prescriptions New Prescriptions   HYDROMORPHONE (DILAUDID) 2 MG TABLET    Take 1-2 tablets (2-4 mg total) by mouth every 4 (four) hours as needed for severe pain.   METOCLOPRAMIDE (REGLAN) 10 MG TABLET    Take 1 tablet (10 mg total) by mouth every 6 (six) hours as needed.   NALOXEGOL OXALATE (MOVANTIK) 25 MG TABS TABLET    Take 1 tablet (25 mg total) by mouth daily.   I personally performed the services described in this documentation, which was scribed in my presence. The recorded information has been reviewed  and is accurate.       Delora Fuel, MD 95/63/87 [id number]

## 2017-04-15 ENCOUNTER — Telehealth: Payer: Self-pay

## 2017-04-15 ENCOUNTER — Telehealth: Payer: Self-pay | Admitting: *Deleted

## 2017-04-15 NOTE — Telephone Encounter (Signed)
That is good news.  Thank you.

## 2017-04-15 NOTE — Telephone Encounter (Signed)
Dr Konrad Dolores called to determine goals of the palliative consult. Discussed Dr Geralyn Flash last OV note and Val's last telephone note. Dr Konrad Dolores will call the pt and have conversation. He will proceed with a face to face contact if needed. He will discuss palliative and hospice. If the family and patient are agreeable to either he will go forward with enrolling them.  Dr Konrad Dolores s/w husband and they are willing to agree with hospice.

## 2017-04-15 NOTE — Telephone Encounter (Signed)
This RN received call from Delene Ruffini - pt liason/spanish interpreter stating family is still waiting for visit with palliative services per [facility].  This RN contacted intake at [facility] and was able to speak directly with Lelan Pons- intake coordinator for Palliative - note referral was placed per call on 5/30 - visits are scheduled by the NP assigned.  This RN discussed urgency of need for visit today - including need for spanish interpreter. Information given that per current medical status pt is hospice appropriate but due to language and cultural barriers family does not understand the benefits of hospice.  Lelan Pons took above information and is forwarding it urgently to Palliative supervisor - Dr Odetta Pink.  This RN also gave Lelan Pons - phone number for contact with [facility] if needed.  This RN then contacted Almy[facility] Free and informed her of above conversation.

## 2017-04-18 ENCOUNTER — Telehealth: Payer: Self-pay | Admitting: Emergency Medicine

## 2017-04-18 NOTE — Telephone Encounter (Signed)
According to [facility] with [facility]; patient had declined hospice services at this time. Dr Lindi Adie aware.

## 2017-04-29 ENCOUNTER — Ambulatory Visit: Payer: Self-pay

## 2017-04-29 ENCOUNTER — Other Ambulatory Visit: Payer: Self-pay

## 2017-05-02 ENCOUNTER — Telehealth: Payer: Self-pay

## 2017-05-02 NOTE — Telephone Encounter (Signed)
keisha from hospice [facility] called for pain control issues, abdominal and bilateral leg pain. Called back and asked that hospice MD manage this. Dr Lindi Adie is out of the country this week.

## 2017-08-30 ENCOUNTER — Telehealth: Payer: Self-pay

## 2017-08-30 NOTE — Telephone Encounter (Signed)
Thank you for letting us know.  

## 2017-08-30 NOTE — Telephone Encounter (Signed)
[name] at [facility] called that pt passed away today at 0904 at [facility]. Death certificate will be coming.

## 2017-10-20 ENCOUNTER — Other Ambulatory Visit: Payer: Self-pay | Admitting: Nurse Practitioner

## 2017-12-02 ENCOUNTER — Ambulatory Visit: Payer: Self-pay | Admitting: Hematology and Oncology

## 2018-03-15 ENCOUNTER — Telehealth: Payer: Self-pay | Admitting: Hematology and Oncology

## 2018-03-15 NOTE — Telephone Encounter (Signed)
Printed ROI for pick up on 03/15/18, Release ID [id number]
